# Patient Record
Sex: Male | Born: 1975 | Race: Black or African American | Hispanic: No | Marital: Married | Smoking: Current every day smoker
Health system: Southern US, Community
[De-identification: ages and names within clinical notes are randomized; demographics above are authoritative.]

## PROBLEM LIST (undated history)

## (undated) DIAGNOSIS — J45909 Unspecified asthma, uncomplicated: Secondary | ICD-10-CM

## (undated) DIAGNOSIS — J40 Bronchitis, not specified as acute or chronic: Secondary | ICD-10-CM

## (undated) DIAGNOSIS — I10 Essential (primary) hypertension: Secondary | ICD-10-CM

## (undated) DIAGNOSIS — K611 Rectal abscess: Secondary | ICD-10-CM

---

## 1999-11-12 ENCOUNTER — Emergency Department (HOSPITAL_COMMUNITY): Admission: EM | Admit: 1999-11-12 | Discharge: 1999-11-12 | Payer: Self-pay | Admitting: Emergency Medicine

## 1999-11-12 ENCOUNTER — Encounter: Payer: Self-pay | Admitting: Emergency Medicine

## 2000-01-21 ENCOUNTER — Encounter: Payer: Self-pay | Admitting: Emergency Medicine

## 2000-01-21 ENCOUNTER — Emergency Department (HOSPITAL_COMMUNITY): Admission: EM | Admit: 2000-01-21 | Discharge: 2000-01-21 | Payer: Self-pay | Admitting: Emergency Medicine

## 2000-04-13 ENCOUNTER — Emergency Department (HOSPITAL_COMMUNITY): Admission: EM | Admit: 2000-04-13 | Discharge: 2000-04-13 | Payer: Self-pay | Admitting: Emergency Medicine

## 2000-07-12 ENCOUNTER — Emergency Department (HOSPITAL_COMMUNITY): Admission: EM | Admit: 2000-07-12 | Discharge: 2000-07-12 | Payer: Self-pay | Admitting: Emergency Medicine

## 2000-09-26 ENCOUNTER — Emergency Department (HOSPITAL_COMMUNITY): Admission: EM | Admit: 2000-09-26 | Discharge: 2000-09-26 | Payer: Self-pay | Admitting: Emergency Medicine

## 2000-10-18 ENCOUNTER — Emergency Department (HOSPITAL_COMMUNITY): Admission: EM | Admit: 2000-10-18 | Discharge: 2000-10-18 | Payer: Self-pay | Admitting: Emergency Medicine

## 2000-10-18 ENCOUNTER — Encounter: Payer: Self-pay | Admitting: Emergency Medicine

## 2000-10-31 ENCOUNTER — Ambulatory Visit (HOSPITAL_COMMUNITY): Admission: RE | Admit: 2000-10-31 | Discharge: 2000-10-31 | Payer: Self-pay | Admitting: Family Medicine

## 2000-10-31 ENCOUNTER — Encounter: Payer: Self-pay | Admitting: Family Medicine

## 2001-02-27 ENCOUNTER — Emergency Department (HOSPITAL_COMMUNITY): Admission: EM | Admit: 2001-02-27 | Discharge: 2001-02-27 | Payer: Self-pay | Admitting: Emergency Medicine

## 2003-08-05 ENCOUNTER — Emergency Department (HOSPITAL_COMMUNITY): Admission: EM | Admit: 2003-08-05 | Discharge: 2003-08-05 | Payer: Self-pay | Admitting: Emergency Medicine

## 2005-02-24 ENCOUNTER — Emergency Department (HOSPITAL_COMMUNITY): Admission: EM | Admit: 2005-02-24 | Discharge: 2005-02-24 | Payer: Self-pay | Admitting: Emergency Medicine

## 2005-10-19 ENCOUNTER — Emergency Department (HOSPITAL_COMMUNITY): Admission: EM | Admit: 2005-10-19 | Discharge: 2005-10-19 | Payer: Self-pay | Admitting: Emergency Medicine

## 2006-03-02 ENCOUNTER — Emergency Department (HOSPITAL_COMMUNITY): Admission: EM | Admit: 2006-03-02 | Discharge: 2006-03-02 | Payer: Self-pay | Admitting: Emergency Medicine

## 2007-05-01 ENCOUNTER — Emergency Department (HOSPITAL_COMMUNITY): Admission: EM | Admit: 2007-05-01 | Discharge: 2007-05-01 | Payer: Self-pay | Admitting: Emergency Medicine

## 2007-12-09 ENCOUNTER — Emergency Department (HOSPITAL_COMMUNITY): Admission: EM | Admit: 2007-12-09 | Discharge: 2007-12-09 | Payer: Self-pay | Admitting: Emergency Medicine

## 2008-02-23 ENCOUNTER — Emergency Department (HOSPITAL_COMMUNITY): Admission: EM | Admit: 2008-02-23 | Discharge: 2008-02-23 | Payer: Self-pay | Admitting: Emergency Medicine

## 2008-02-24 ENCOUNTER — Emergency Department (HOSPITAL_COMMUNITY): Admission: EM | Admit: 2008-02-24 | Discharge: 2008-02-24 | Payer: Self-pay | Admitting: Emergency Medicine

## 2008-05-27 ENCOUNTER — Emergency Department (HOSPITAL_COMMUNITY): Admission: EM | Admit: 2008-05-27 | Discharge: 2008-05-27 | Payer: Self-pay | Admitting: Emergency Medicine

## 2010-02-16 ENCOUNTER — Emergency Department (HOSPITAL_COMMUNITY): Admission: EM | Admit: 2010-02-16 | Discharge: 2010-02-16 | Payer: Self-pay | Admitting: Emergency Medicine

## 2010-03-13 ENCOUNTER — Emergency Department (HOSPITAL_COMMUNITY): Admission: EM | Admit: 2010-03-13 | Discharge: 2010-03-14 | Payer: Self-pay | Admitting: Emergency Medicine

## 2010-11-29 LAB — URINALYSIS, ROUTINE W REFLEX MICROSCOPIC
Bilirubin Urine: NEGATIVE
Glucose, UA: NEGATIVE mg/dL
Leukocytes, UA: NEGATIVE
Nitrite: NEGATIVE
Protein, ur: NEGATIVE mg/dL
Specific Gravity, Urine: 1.023 (ref 1.005–1.030)
Urobilinogen, UA: 1 mg/dL (ref 0.0–1.0)
pH: 5.5 (ref 5.0–8.0)

## 2010-11-29 LAB — URINE MICROSCOPIC-ADD ON

## 2010-11-30 LAB — GC/CHLAMYDIA PROBE AMP, URINE
Chlamydia, Swab/Urine, PCR: NEGATIVE
GC Probe Amp, Urine: NEGATIVE

## 2011-06-07 LAB — GC/CHLAMYDIA PROBE AMP, GENITAL
Chlamydia, DNA Probe: NEGATIVE
GC Probe Amp, Genital: POSITIVE — AB

## 2011-06-25 LAB — GC/CHLAMYDIA PROBE AMP, GENITAL
Chlamydia, DNA Probe: NEGATIVE
GC Probe Amp, Genital: NEGATIVE

## 2011-06-25 LAB — RPR: RPR Ser Ql: NONREACTIVE

## 2014-04-28 ENCOUNTER — Emergency Department (INDEPENDENT_AMBULATORY_CARE_PROVIDER_SITE_OTHER): Payer: BC Managed Care – PPO

## 2014-04-28 ENCOUNTER — Emergency Department (HOSPITAL_COMMUNITY)
Admission: EM | Admit: 2014-04-28 | Discharge: 2014-04-28 | Disposition: A | Discharge status: Home / Self Care | Payer: BC Managed Care – PPO | Source: Home / Self Care | Attending: Emergency Medicine | Admitting: Emergency Medicine

## 2014-04-28 ENCOUNTER — Encounter (HOSPITAL_COMMUNITY): Payer: Self-pay | Admitting: Emergency Medicine

## 2014-04-28 DIAGNOSIS — J069 Acute upper respiratory infection, unspecified: Secondary | ICD-10-CM

## 2014-04-28 DIAGNOSIS — J45901 Unspecified asthma with (acute) exacerbation: Secondary | ICD-10-CM

## 2014-04-28 MED ORDER — ALBUTEROL SULFATE HFA 108 (90 BASE) MCG/ACT IN AERS
INHALATION_SPRAY | RESPIRATORY_TRACT | Status: AC
  Filled 2014-04-28: qty 6.7

## 2014-04-28 MED ORDER — PREDNISONE 50 MG PO TABS: 50.0000 mg | ORAL_TABLET | Freq: Every day | ORAL | Status: DC

## 2014-04-28 MED ORDER — ALBUTEROL SULFATE HFA 108 (90 BASE) MCG/ACT IN AERS: 2.0000 | INHALATION_SPRAY | RESPIRATORY_TRACT | Status: DC | PRN

## 2014-04-28 MED ORDER — ALBUTEROL SULFATE HFA 108 (90 BASE) MCG/ACT IN AERS
2.0000 | INHALATION_SPRAY | RESPIRATORY_TRACT | Status: DC | PRN
  Administered 2014-04-28: 2 via RESPIRATORY_TRACT

## 2014-04-28 MED ORDER — DEXAMETHASONE SODIUM PHOSPHATE 10 MG/ML IJ SOLN
10.0000 mg | Freq: Once | INTRAMUSCULAR | Status: AC
  Administered 2014-04-28: 10 mg via INTRAMUSCULAR

## 2014-04-28 MED ORDER — IPRATROPIUM BROMIDE 0.02 % IN SOLN
RESPIRATORY_TRACT | Status: AC
  Filled 2014-04-28: qty 2.5

## 2014-04-28 MED ORDER — IPRATROPIUM BROMIDE 0.02 % IN SOLN
0.5000 mg | Freq: Once | RESPIRATORY_TRACT | Status: AC
  Administered 2014-04-28: 0.5 mg via RESPIRATORY_TRACT

## 2014-04-28 MED ORDER — ALBUTEROL SULFATE (2.5 MG/3ML) 0.083% IN NEBU
5.0000 mg | INHALATION_SOLUTION | Freq: Once | RESPIRATORY_TRACT | Status: AC
  Administered 2014-04-28: 5 mg via RESPIRATORY_TRACT

## 2014-04-28 MED ORDER — ALBUTEROL SULFATE (2.5 MG/3ML) 0.083% IN NEBU: 2.5000 mg | INHALATION_SOLUTION | RESPIRATORY_TRACT | Status: DC | PRN

## 2014-04-28 MED ORDER — AEROCHAMBER PLUS FLO-VU LARGE MISC
1.0000 | Freq: Once | Status: AC
  Administered 2014-04-28: 1

## 2014-04-28 MED ORDER — ALBUTEROL SULFATE (2.5 MG/3ML) 0.083% IN NEBU
INHALATION_SOLUTION | RESPIRATORY_TRACT | Status: AC
  Filled 2014-04-28: qty 6

## 2014-04-28 MED ORDER — CHLORPHENIRAMINE-PSE-IBUPROFEN 2-30-200 MG PO TABS: ORAL_TABLET | ORAL | Status: DC

## 2014-04-28 MED ORDER — DEXAMETHASONE SODIUM PHOSPHATE 10 MG/ML IJ SOLN
INTRAMUSCULAR | Status: AC
  Filled 2014-04-28: qty 1

## 2014-04-28 NOTE — ED Notes (Signed)
Pt c/o cold sx onset yest;  Sx include: SOB, wheezing, productive cough, chest d/c w/deep breaths, weakness Has been using son's nebulizer tx and pro air; needing refills Denies f/v/n/d Alert and talking in complete sentences No signs of acute distress.

## 2014-04-28 NOTE — ED Provider Notes (Signed)
CSN: [id number]     Arrival date & time 04/28/14  1112 History   First MD Initiated Contact with Patient 04/28/14 1227     Chief Complaint  Patient presents with  . URI   (Consider location/radiation/quality/duration/timing/severity/associated sxs/prior Treatment) HPI Comments: 38 year old male presents for evaluation of congestion, cough, wheezing, chest tightness. This started 5 days ago with what he describes as feeling like he is about to get a cold. He had nasal congestion and a tickle in his throat. 2 days ago he started to develop some mild wheezing, and this has gotten a lot worse today. He felt very short of breath upon arrival, but was immediately started on a nebulizer treatment which helped significantly. He denies any shortness of breath at this time. He has never been diagnosed with asthma but he reports a history of frequent wheezing when exposed to dust, or when he gets any sort of respiratory infection. He denies any leg swelling, pleuritic chest pain, fever, chills, NVD. No recent travel or sick contacts.   History reviewed. No pertinent past medical history. History reviewed. No pertinent past surgical history. No family history on file. History  Substance Use Topics  . Smoking status: Current Every Day Smoker -- 0.50 packs/day    Types: Cigarettes  . Smokeless tobacco: Not on file  . Alcohol Use: Yes    Review of Systems  Constitutional: Positive for fatigue. Negative for fever and chills.  HENT: Positive for congestion. Negative for ear pain, postnasal drip, rhinorrhea, sinus pressure, sneezing and sore throat.   Respiratory: Positive for cough, chest tightness, shortness of breath and wheezing.   Cardiovascular: Negative for chest pain, palpitations and leg swelling.  All other systems reviewed and are negative.   Allergies  Review of patient's allergies indicates no known allergies.  Home Medications   Prior to Admission medications   Medication Sig Start  Date End Date Taking? Authorizing Provider  albuterol (PROVENTIL HFA;VENTOLIN HFA) 108 (90 BASE) MCG/ACT inhaler Inhale 2 puffs into the lungs every 4 (four) hours as needed for wheezing. 04/28/14   Graylon Good, PA-C  albuterol (PROVENTIL) (2.5 MG/3ML) 0.083% nebulizer solution Take 3 mLs (2.5 mg total) by nebulization every 4 (four) hours as needed for wheezing or shortness of breath. 04/28/14   Graylon Good, PA-C  Chlorpheniramine-PSE-Ibuprofen (ADVIL ALLERGY SINUS) 2-30-200 MG TABS 1-2 tabs PO Q4-6 hrs PRN 04/28/14   Graylon Good, PA-C  predniSONE (DELTASONE) 50 MG tablet Take 1 tablet (50 mg total) by mouth daily with breakfast. 04/28/14   Graylon Good, PA-C   BP 127/68  Pulse 101  Temp(Src) 98.3 F (36.8 C) (Oral)  Resp 20  SpO2 97% Physical Exam  Nursing note and vitals reviewed. Constitutional: He is oriented to person, place, and time. He appears well-developed and well-nourished. No distress.  HENT:  Head: Normocephalic and atraumatic.  Right Ear: External ear normal.  Left Ear: External ear normal.  Nose: Nose normal.  Mouth/Throat: Oropharynx is clear and moist. No oropharyngeal exudate.  Neck: Normal range of motion. Neck supple.  Cardiovascular: Normal rate, regular rhythm and normal heart sounds.  Exam reveals no gallop and no friction rub.   No murmur heard. Pulmonary/Chest: Effort normal. No respiratory distress. He has wheezes (scattered, expiratory). He has no rales. He exhibits no tenderness.  Prolonged expiratory phase  Lymphadenopathy:    He has no cervical adenopathy.  Neurological: He is alert and oriented to person, place, and time. Coordination normal.  Skin: Skin is  warm and dry. No rash noted. He is not diaphoretic.  Psychiatric: He has a normal mood and affect. Judgment normal.    ED Course  Procedures (including critical care time) Labs Review Labs Reviewed - No data to display  Imaging Review Dg Chest 2 View  04/28/2014   CLINICAL DATA:   Chest pain, cough and congestion for 24 hr, smoker  EXAM: CHEST  2 VIEW  COMPARISON:  05/27/2008  FINDINGS: Normal heart size, mediastinal contours, and pulmonary vascularity.  Lungs hyperaerated but clear.  No infiltrate, pleural effusion, or pneumothorax.  Bones unremarkable.  IMPRESSION: Hyperinflated lungs without acute infiltrate.   Electronically Signed   By: Ulyses Southward M.D.   On: 04/28/2014 12:47   After 1 nebulizer treatment with 5 mg of albuterol and 0.5 mg of ipratropium, he still has wheezing. Will repeat nebulizer, and 10 mg of IM Decadron, chest x-ray  MDM   1. RAD (reactive airway disease), unspecified asthma severity, with acute exacerbation   2. URI (upper respiratory infection)    After second nebulizer treatment, wheezing has completely resolved. Chest x-ray supports reactive airway disease. Given this patient's reported history, he probably has asthma at baseline. Encouraged to stop smoking. Followup with primary care ASAP, I have given him some resources for finding a primary care provider. At this time, he will be discharged with albuterol inhaler, as well as neb solution and a prescription for a nebulizer at the patient's request, 50 mg of oral prednisone daily for the next 3 days, and recommended Advil allergy sinus for cold symptoms.  Also Rx neb machine at pt request, advised take to medical supply store   Meds ordered this encounter  Medications  . albuterol (PROVENTIL) (2.5 MG/3ML) 0.083% nebulizer solution 5 mg    Sig:   . ipratropium (ATROVENT) nebulizer solution 0.5 mg    Sig:   . albuterol (PROVENTIL) (2.5 MG/3ML) 0.083% nebulizer solution 5 mg    Sig:   . ipratropium (ATROVENT) nebulizer solution 0.5 mg    Sig:   . dexamethasone (DECADRON) injection 10 mg    Sig:   . albuterol (PROVENTIL HFA;VENTOLIN HFA) 108 (90 BASE) MCG/ACT inhaler 2 puff    Sig:   . AEROCHAMBER PLUS FLO-VU LARGE MISC 1 each    Sig:   . predniSONE (DELTASONE) 50 MG tablet    Sig: Take  1 tablet (50 mg total) by mouth daily with breakfast.    Dispense:  3 tablet    Refill:  0    Order Specific Question:  Supervising Provider    Answer:  Lorenz Coaster, DAVID C [id number]  . Chlorpheniramine-PSE-Ibuprofen (ADVIL ALLERGY SINUS) 2-30-200 MG TABS    Sig: 1-2 tabs PO Q4-6 hrs PRN    Dispense:  30 each    Refill:  1    Order Specific Question:  Supervising Provider    Answer:  Lorenz Coaster, DAVID C [id number]  . albuterol (PROVENTIL HFA;VENTOLIN HFA) 108 (90 BASE) MCG/ACT inhaler    Sig: Inhale 2 puffs into the lungs every 4 (four) hours as needed for wheezing.    Dispense:  1 Inhaler    Refill:  0    Order Specific Question:  Supervising Provider    Answer:  Lorenz Coaster, DAVID C [id number]  . albuterol (PROVENTIL) (2.5 MG/3ML) 0.083% nebulizer solution    Sig: Take 3 mLs (2.5 mg total) by nebulization every 4 (four) hours as needed for wheezing or shortness of breath.    Dispense:  75 mL  Refill:  1    Order Specific Question:  Supervising Provider    Answer:  Lorenz CoasterKELLER, DAVID C [[id number]]     Graylon GoodZachary H Walid Haig, PA-C 04/28/14 1539

## 2014-04-28 NOTE — ED Notes (Signed)
Per Dr. Lorenz CoasterKeller, EGK = normal Ok to give breathing tx

## 2014-04-28 NOTE — Discharge Instructions (Signed)
Asthma, Acute Bronchospasm °Acute bronchospasm caused by asthma is also referred to as an asthma attack. Bronchospasm means your air passages become narrowed. The narrowing is caused by inflammation and tightening of the muscles in the air tubes (bronchi) in your lungs. This can make it hard to breathe or cause you to wheeze and cough. °CAUSES °Possible triggers are: °· Animal dander from the skin, hair, or feathers of animals. °· Dust mites contained in house dust. °· Cockroaches. °· Pollen from trees or grass. °· Mold. °· Cigarette or tobacco smoke. °· Air pollutants such as dust, household cleaners, hair sprays, aerosol sprays, paint fumes, strong chemicals, or strong odors. °· Cold air or weather changes. Cold air may trigger inflammation. Winds increase molds and pollens in the air. °· Strong emotions such as crying or laughing hard. °· Stress. °· Certain medicines such as aspirin or beta-blockers. °· Sulfites in foods and drinks, such as dried fruits and wine. °· Infections or inflammatory conditions, such as a flu, cold, or inflammation of the nasal membranes (rhinitis). °· Gastroesophageal reflux disease (GERD). GERD is a condition where stomach acid backs up into your esophagus. °· Exercise or strenuous activity. °SIGNS AND SYMPTOMS  °· Wheezing. °· Excessive coughing, particularly at night. °· Chest tightness. °· Shortness of breath. °DIAGNOSIS  °Your health care provider will ask you about your medical history and perform a physical exam. A chest X-ray or blood testing may be performed to look for other causes of your symptoms or other conditions that may have triggered your asthma attack.  °TREATMENT  °Treatment is aimed at reducing inflammation and opening up the airways in your lungs.  Most asthma attacks are treated with inhaled medicines. These include quick relief or rescue medicines (such as bronchodilators) and controller medicines (such as inhaled corticosteroids). These medicines are sometimes  given through an inhaler or a nebulizer. Systemic steroid medicine taken by mouth or given through an IV tube also can be used to reduce the inflammation when an attack is moderate or severe. Antibiotic medicines are only used if a bacterial infection is present.  °HOME CARE INSTRUCTIONS  °· Rest. °· Drink plenty of liquids. This helps the mucus to remain thin and be easily coughed up. Only use caffeine in moderation and do not use alcohol until you have recovered from your illness. °· Do not smoke. Avoid being exposed to secondhand smoke. °· You play a critical role in keeping yourself in good health. Avoid exposure to things that cause you to wheeze or to have breathing problems. °· Keep your medicines up-to-date and available. Carefully follow your health care provider's treatment plan. °· Take your medicine exactly as prescribed. °· When pollen or pollution is bad, keep windows closed and use an air conditioner or go to places with air conditioning. °· Asthma requires careful medical care. See your health care provider for a follow-up as advised. If you are more than [redacted] weeks pregnant and you were prescribed any new medicines, let your obstetrician know about the visit and how you are doing. Follow up with your health care provider as directed. °· After you have recovered from your asthma attack, make an appointment with your outpatient doctor to talk about ways to reduce the likelihood of future attacks. If you do not have a doctor who manages your asthma, make an appointment with a primary care doctor to discuss your asthma. °SEEK IMMEDIATE MEDICAL CARE IF:  °· You are getting worse. °· You have trouble breathing. If severe, call your local   emergency services (911 in the [place]  You develop chest pain or discomfort.  You are vomiting.  You are not able to keep fluids down.  You are coughing up yellow, green, brown, or bloody sputum.  You have a fever and your symptoms suddenly get worse.  You have  trouble swallowing. MAKE SURE YOU:   Understand these instructions.  Will watch your condition.  Will get help right away if you are not doing well or get worse. Document Released: 12/15/2006 Document Revised: 09/04/2013 Document Reviewed: 03/07/2013 [facility] Patient Information 2015 [place], [place]. This information is not intended to replace advice given to you by your health care provider. Make sure you discuss any questions you have with your health care provider.  How to Use a Nebulizer If you have asthma or other breathing problems, you might need to breathe in (inhale) medicine. This can be done with a nebulizer. A nebulizer is a device that turns liquid medicine into a mist that you can inhale.  There are different kinds of nebulizers. Most are small. With some, you breathe in through a mouthpiece. With others, a mask fits over your nose and mouth. Most nebulizers must be connected to a small air compressor. Air is forced through tubing from the compressor to the nebulizer. The forced air changes the liquid into a fine spray. RISKS AND COMPLICATIONS The nebulizer must work properly for it to help your breathing. If the nebulizer does not produce mist, or if foam comes out, this indicates that the nebulizer is not working properly. Sometimes a filter can get clogged, or there might be a problem with the air compressor. Check the instruction booklet that came with your nebulizer. It should tell you how to fix problems or where to call for help. You should have at least one extra nebulizer at home. That way, you will always have one when you need it.  HOW TO PREPARE BEFORE USING THE NEBULIZER Take these steps before using the nebulizer: 1. Check your medicine. Make sure it has not expired and is not damaged in any way.  2. Wash your hands with soap and water.  3. Put all the parts of your nebulizer on a sturdy, flat surface. Make sure the tubing connects the compressor and the  nebulizer. 4. Measure the liquid medicine according to your health care provider's instructions. Pour it into the nebulizer. 5. Attach the mouthpiece or mask.  6. Test the nebulizer by turning it on to make sure a spray is coming out. Then, turn it off.  HOW TO USE THE NEBULIZER 1. Sit down and focus on staying relaxed.  2. If your nebulizer has a mask, put it over your nose and mouth. If you use a mouthpiece, put it in your mouth. Press your lips firmly around the mouthpiece. 3. Turn on the nebulizer.  4. Breathe out.  5. Some nebulizers have a finger valve. If yours does, cover up the air hole so the air gets to the nebulizer. 6. Once the medicine begins to mist out, take slow, deep breaths. If there is a finger valve, release it at the end of your breath. 7. Continue taking slow, deep breaths until the nebulizer is empty.  Be sure to stop the machine at any point if you start coughing or if the medicine foams or bubbles. HOW TO CLEAN THE NEBULIZER  The nebulizer and all its parts must be kept very clean. Follow the manufacturer's instructions for cleaning. For most nebulizers, you should follow  these guidelines:  Wash the nebulizer after each use. Use warm water and soap. Rinse it well. Shake the nebulizer to remove extra water. Put it on a clean towel until it is completely dry. To make sure it is dry, put the nebulizer back together. Turn on the compressor for a few minutes. This will blow air through the nebulizer.   Do not wash the tubing or the finger valve.   Store the nebulizer in a dust-free place.   Inspect the filter every week. Replace it any time it looks dirty.   Sometimes the nebulizer will need a more complete cleaning. The instruction booklet should say how often you need to do this. SEEK MEDICAL CARE IF:   You continue to have difficulty breathing.   You have trouble using the nebulizer.  Document Released: 08/18/2009 Document Revised: 01/14/2014 Document  Reviewed: 02/19/2013 [facility] Patient Information 2015 [place], [place]. This information is not intended to replace advice given to you by your health care provider. Make sure you discuss any questions you have with your health care provider.  Metered Dose Inhaler with Spacer Inhaled medicines are the basis of treatment of asthma and other breathing problems. Inhaled medicine can only be effective if used properly. Good technique assures that the medicine reaches the lungs. Your health care provider has asked you to use a spacer with your inhaler to help you take the medicine more effectively. A spacer is a plastic tube with a mouthpiece on one end and an opening that connects to the inhaler on the other end. Metered dose inhalers (MDIs) are used to deliver a variety of inhaled medicines. These include quick relief or rescue medicines (such as bronchodilators) and controller medicines (such as corticosteroids). The medicine is delivered by pushing down on a metal canister to release a set amount of spray. If you are using different kinds of inhalers, use your quick relief medicine to open the airways 10-15 minutes before using a steroid if instructed to do so by your health care provider. If you are unsure which inhalers to use and the order of using them, ask your health care provider, nurse, or respiratory therapist. HOW TO USE THE INHALER WITH A SPACER 1. Remove cap from inhaler. 2. If you are using the inhaler for the first time, you will need to prime it. Shake the inhaler for 5 seconds and release four puffs into the air, away from your face. Ask your health care provider or pharmacist if you have questions about priming your inhaler. 3. Shake inhaler for 5 seconds before each breath in (inhalation). 4. Place the open end of the spacer onto the mouthpiece of the inhaler. 5. Position the inhaler so that the top of the canister faces up and the spacer mouthpiece faces you. 6. Put your index finger  on the top of the medicine canister. Your thumb supports the bottom of the inhaler and the spacer. 7. Breathe out (exhale) normally and as completely as possible. 8. Immediately after exhaling, place the spacer between your teeth and into your mouth. Close your mouth tightly around the spacer. 9. Press the canister down with the index finger to release the medicine. 10. At the same time as the canister is pressed, inhale deeply and slowly until the lungs are completely filled. This should take 4-6 seconds. Keep your tongue down and out of the way. 11. Hold the medicine in your lungs for 5-10 seconds (10 seconds is best). This helps the medicine get into the small  airways of your lungs. Exhale. 12. Repeat inhaling deeply through the spacer mouthpiece. Again hold that breath for up to 10 seconds (10 seconds is best). Exhale slowly. If it is difficult to take this second deep breath through the spacer, breathe normally several times through the spacer. Remove the spacer from your mouth. 13. Wait at least 15-30 seconds between puffs. Continue with the above steps until you have taken the number of puffs your health care provider has ordered. Do not use the inhaler more than your health care provider directs you to. 14. Remove spacer from the inhaler and place cap on inhaler. 15. Follow the directions from your health care provider or the inhaler insert for cleaning the inhaler and spacer. If you are using a steroid inhaler, rinse your mouth with water after your last puff, gargle, and spit out the water. Do not swallow the water. AVOID:  Inhaling before or after starting the spray of medicine. It takes practice to coordinate your breathing with triggering the spray.  Inhaling through the nose (rather than the mouth) when triggering the spray. HOW TO DETERMINE IF YOUR INHALER IS FULL OR NEARLY EMPTY You cannot know when an inhaler is empty by shaking it. A few inhalers are now being made with dose  counters. Ask your health care provider for a prescription that has a dose counter if you feel you need that extra help. If your inhaler does not have a counter, ask your health care provider to help you determine the date you need to refill your inhaler. Write the refill date on a calendar or your inhaler canister. Refill your inhaler 7-10 days before it runs out. Be sure to keep an adequate supply of medicine. This includes making sure it is not expired, and you have a spare inhaler.  SEEK MEDICAL CARE IF:   Symptoms are only partially relieved with your inhaler.  You are having trouble using your inhaler.  You experience some increase in phlegm. SEEK IMMEDIATE MEDICAL CARE IF:   You feel little or no relief with your inhalers. You are still wheezing and are feeling shortness of breath or tightness in your chest or both.  You have dizziness, headaches, or fast heart rate.  You have chills, fever, or night sweats.  There is a noticeable increase in phlegm production, or there is blood in the phlegm. Document Released: 08/30/2005 Document Revised: 01/14/2014 Document Reviewed: 02/15/2013 [facility] Patient Information 2015 [place], [place]. This information is not intended to replace advice given to you by your health care provider. Make sure you discuss any questions you have with your health care provider.  Upper Respiratory Infection, Adult An upper respiratory infection (URI) is also sometimes known as the common cold. The upper respiratory tract includes the nose, sinuses, throat, trachea, and bronchi. Bronchi are the airways leading to the lungs. Most people improve within 1 week, but symptoms can last up to 2 weeks. A residual cough may last even longer.  CAUSES Many different viruses can infect the tissues lining the upper respiratory tract. The tissues become irritated and inflamed and often become very moist. Mucus production is also common. A cold is contagious. You can easily spread  the virus to others by oral contact. This includes kissing, sharing a glass, coughing, or sneezing. Touching your mouth or nose and then touching a surface, which is then touched by another person, can also spread the virus. SYMPTOMS  Symptoms typically develop 1 to 3 days after you come in contact with  a cold virus. Symptoms vary from person to person. They may include:  Runny nose.  Sneezing.  Nasal congestion.  Sinus irritation.  Sore throat.  Loss of voice (laryngitis).  Cough.  Fatigue.  Muscle aches.  Loss of appetite.  Headache.  Low-grade fever. DIAGNOSIS  You might diagnose your own cold based on familiar symptoms, since most people get a cold 2 to 3 times a year. Your caregiver can confirm this based on your exam. Most importantly, your caregiver can check that your symptoms are not due to another disease such as strep throat, sinusitis, pneumonia, asthma, or epiglottitis. Blood tests, throat tests, and X-rays are not necessary to diagnose a common cold, but they may sometimes be helpful in excluding other more serious diseases. Your caregiver will decide if any further tests are required. RISKS AND COMPLICATIONS  You may be at risk for a more severe case of the common cold if you smoke cigarettes, have chronic heart disease (such as heart failure) or lung disease (such as asthma), or if you have a weakened immune system. The very young and very old are also at risk for more serious infections. Bacterial sinusitis, middle ear infections, and bacterial pneumonia can complicate the common cold. The common cold can worsen asthma and chronic obstructive pulmonary disease (COPD). Sometimes, these complications can require emergency medical care and may be life-threatening. PREVENTION  The best way to protect against getting a cold is to practice good hygiene. Avoid oral or hand contact with people with cold symptoms. Wash your hands often if contact occurs. There is no clear  evidence that vitamin C, vitamin E, echinacea, or exercise reduces the chance of developing a cold. However, it is always recommended to get plenty of rest and practice good nutrition. TREATMENT  Treatment is directed at relieving symptoms. There is no cure. Antibiotics are not effective, because the infection is caused by a virus, not by bacteria. Treatment may include:  Increased fluid intake. Sports drinks offer valuable electrolytes, sugars, and fluids.  Breathing heated mist or steam (vaporizer or shower).  Eating chicken soup or other clear broths, and maintaining good nutrition.  Getting plenty of rest.  Using gargles or lozenges for comfort.  Controlling fevers with ibuprofen or acetaminophen as directed by your caregiver.  Increasing usage of your inhaler if you have asthma. Zinc gel and zinc lozenges, taken in the first 24 hours of the common cold, can shorten the duration and lessen the severity of symptoms. Pain medicines may help with fever, muscle aches, and throat pain. A variety of non-prescription medicines are available to treat congestion and runny nose. Your caregiver can make recommendations and may suggest nasal or lung inhalers for other symptoms.  HOME CARE INSTRUCTIONS   Only take over-the-counter or prescription medicines for pain, discomfort, or fever as directed by your caregiver.  Use a warm mist humidifier or inhale steam from a shower to increase air moisture. This may keep secretions moist and make it easier to breathe.  Drink enough water and fluids to keep your urine clear or pale yellow.  Rest as needed.  Return to work when your temperature has returned to normal or as your caregiver advises. You may need to stay home longer to avoid infecting others. You can also use a face mask and careful hand washing to prevent spread of the virus. SEEK MEDICAL CARE IF:   After the first few days, you feel you are getting worse rather than better.  You need  your  caregiver's advice about medicines to control symptoms.  You develop chills, worsening shortness of breath, or brown or red sputum. These may be signs of pneumonia.  You develop yellow or brown nasal discharge or pain in the face, especially when you bend forward. These may be signs of sinusitis.  You develop a fever, swollen neck glands, pain with swallowing, or white areas in the back of your throat. These may be signs of strep throat. SEEK IMMEDIATE MEDICAL CARE IF:   You have a fever.  You develop severe or persistent headache, ear pain, sinus pain, or chest pain.  You develop wheezing, a prolonged cough, cough up blood, or have a change in your usual mucus (if you have chronic lung disease).  You develop sore muscles or a stiff neck. Document Released: 02/23/2001 Document Revised: 11/22/2011 Document Reviewed: 12/05/2013 [facility] Patient Information 2015 [place], [place]. This information is not intended to replace advice given to you by your health care provider. Make sure you discuss any questions you have with your health care provider.

## 2014-04-29 NOTE — ED Provider Notes (Signed)
Medical screening examination/treatment/procedure(s) were performed by non-physician practitioner and as supervising physician I was immediately available for consultation/collaboration.  Allante Beane, M.D.  Alexzandrea Normington C Ova Gillentine, MD 04/29/14 [id number] 

## 2015-01-06 ENCOUNTER — Emergency Department (HOSPITAL_COMMUNITY): Payer: BLUE CROSS/BLUE SHIELD

## 2015-01-06 ENCOUNTER — Encounter (HOSPITAL_COMMUNITY): Payer: Self-pay | Admitting: Emergency Medicine

## 2015-01-06 ENCOUNTER — Emergency Department (HOSPITAL_COMMUNITY)
Admission: EM | Admit: 2015-01-06 | Discharge: 2015-01-06 | Disposition: A | Discharge status: Home / Self Care | Payer: BLUE CROSS/BLUE SHIELD | Attending: Emergency Medicine | Admitting: Emergency Medicine

## 2015-01-06 DIAGNOSIS — Z720 Tobacco use: Secondary | ICD-10-CM | POA: Insufficient documentation

## 2015-01-06 DIAGNOSIS — Z7951 Long term (current) use of inhaled steroids: Secondary | ICD-10-CM | POA: Diagnosis not present

## 2015-01-06 DIAGNOSIS — R69 Illness, unspecified: Secondary | ICD-10-CM

## 2015-01-06 DIAGNOSIS — J111 Influenza due to unidentified influenza virus with other respiratory manifestations: Secondary | ICD-10-CM | POA: Insufficient documentation

## 2015-01-06 DIAGNOSIS — R000 Tachycardia, unspecified: Secondary | ICD-10-CM | POA: Insufficient documentation

## 2015-01-06 DIAGNOSIS — Z79899 Other long term (current) drug therapy: Secondary | ICD-10-CM | POA: Diagnosis not present

## 2015-01-06 DIAGNOSIS — R079 Chest pain, unspecified: Secondary | ICD-10-CM | POA: Diagnosis present

## 2015-01-06 HISTORY — DX: Bronchitis, not specified as acute or chronic: J40

## 2015-01-06 LAB — CBC WITH DIFFERENTIAL/PLATELET
Basophils Absolute: 0 10*3/uL (ref 0.0–0.1)
Basophils Relative: 0 % (ref 0–1)
Eosinophils Absolute: 0 10*3/uL (ref 0.0–0.7)
Eosinophils Relative: 0 % (ref 0–5)
HCT: 46.6 % (ref 39.0–52.0)
Hemoglobin: 16.1 g/dL (ref 13.0–17.0)
Lymphocytes Relative: 4 % — ABNORMAL LOW (ref 12–46)
Lymphs Abs: 0.4 10*3/uL — ABNORMAL LOW (ref 0.7–4.0)
MCH: 29 pg (ref 26.0–34.0)
MCHC: 34.5 g/dL (ref 30.0–36.0)
MCV: 83.8 fL (ref 78.0–100.0)
Monocytes Absolute: 0.8 10*3/uL (ref 0.1–1.0)
Monocytes Relative: 9 % (ref 3–12)
Neutro Abs: 8.4 10*3/uL — ABNORMAL HIGH (ref 1.7–7.7)
Neutrophils Relative %: 87 % — ABNORMAL HIGH (ref 43–77)
Platelets: 185 10*3/uL (ref 150–400)
RBC: 5.56 MIL/uL (ref 4.22–5.81)
RDW: 14 % (ref 11.5–15.5)
WBC: 9.6 10*3/uL (ref 4.0–10.5)

## 2015-01-06 LAB — I-STAT CHEM 8, ED
BUN: 14 mg/dL (ref 6–23)
Calcium, Ion: 1.13 mmol/L (ref 1.12–1.23)
Chloride: 102 mmol/L (ref 96–112)
Creatinine, Ser: 1.2 mg/dL (ref 0.50–1.35)
Glucose, Bld: 109 mg/dL — ABNORMAL HIGH (ref 70–99)
HCT: 53 % — ABNORMAL HIGH (ref 39.0–52.0)
Hemoglobin: 18 g/dL — ABNORMAL HIGH (ref 13.0–17.0)
Potassium: 3.8 mmol/L (ref 3.5–5.1)
Sodium: 139 mmol/L (ref 135–145)
TCO2: 19 mmol/L (ref 0–100)

## 2015-01-06 LAB — I-STAT TROPONIN, ED: Troponin i, poc: 0 ng/mL (ref 0.00–0.08)

## 2015-01-06 MED ORDER — SODIUM CHLORIDE 0.9 % IV BOLUS (SEPSIS)
2000.0000 mL | Freq: Once | INTRAVENOUS | Status: AC
  Administered 2015-01-06: 2000 mL via INTRAVENOUS

## 2015-01-06 MED ORDER — ACETAMINOPHEN 325 MG PO TABS
325.0000 mg | ORAL_TABLET | Freq: Once | ORAL | Status: AC
  Administered 2015-01-06: 325 mg via ORAL
  Filled 2015-01-06: qty 1

## 2015-01-06 MED ORDER — ACETAMINOPHEN 325 MG PO TABS
650.0000 mg | ORAL_TABLET | Freq: Once | ORAL | Status: AC
  Administered 2015-01-06: 650 mg via ORAL

## 2015-01-06 MED ORDER — ALBUTEROL SULFATE (2.5 MG/3ML) 0.083% IN NEBU
5.0000 mg | INHALATION_SOLUTION | Freq: Once | RESPIRATORY_TRACT | Status: AC
  Administered 2015-01-06: 5 mg via RESPIRATORY_TRACT
  Filled 2015-01-06: qty 6

## 2015-01-06 MED ORDER — ACETAMINOPHEN 325 MG PO TABS
ORAL_TABLET | ORAL | Status: DC
Start: 2015-01-06 — End: 2015-01-07
  Filled 2015-01-06: qty 2

## 2015-01-06 NOTE — ED Notes (Signed)
Pt to ED with c/o chest pain and shortness of breath.  Pt was seen at his MD's office last week for bronchitis.

## 2015-01-06 NOTE — ED Provider Notes (Signed)
CSN: [id number]     Arrival date & time 01/06/15  1707 History   First MD Initiated Contact with Patient 01/06/15 1745     Chief Complaint  Patient presents with  . Chest Pain     (Consider location/radiation/quality/duration/timing/severity/associated sxs/prior Treatment) HPI Complains of anterior chest pain with cough productive of clear sputum and shortness of breath onset last week, becoming worse this morning. He was prescribed prednisone which he started taking 4 days ago for "bronchitis" he's also used inhalers at home with partial relief. Associated symptoms include sore throat, headache and diffuse myalgias. No vomiting or nausea. He's had a few episodes of watery stools. No other associated symptoms. Chest pain is worse with coughing myalgias worse with moving or changing positions no other associated symptoms Past Medical History  Diagnosis Date  . Bronchitis    History reviewed. No pertinent past surgical history. No family history on file. History  Substance Use Topics  . Smoking status: Current Every Day Smoker -- 0.50 packs/day    Types: Cigarettes  . Smokeless tobacco: Not on file  . Alcohol Use: Yes    Review of Systems  HENT: Positive for sore throat.   Respiratory: Positive for cough and shortness of breath.   Cardiovascular: Positive for chest pain.  Musculoskeletal: Positive for myalgias.      Allergies  Review of patient's allergies indicates no known allergies.  Home Medications   Prior to Admission medications   Medication Sig Start Date End Date Taking? Authorizing Provider  albuterol (PROVENTIL HFA;VENTOLIN HFA) 108 (90 BASE) MCG/ACT inhaler Inhale 2 puffs into the lungs every 4 (four) hours as needed for wheezing. 04/28/14  Yes Graylon Good, PA-C  albuterol (PROVENTIL) (2.5 MG/3ML) 0.083% nebulizer solution Take 3 mLs (2.5 mg total) by nebulization every 4 (four) hours as needed for wheezing or shortness of breath. 04/28/14  Yes Adrian Blackwater Baker,  PA-C  Fluticasone Furoate-Vilanterol 100-25 MCG/INH AEPB Inhale 2 puffs into the lungs daily.   Yes Historical Provider, MD  Chlorpheniramine-PSE-Ibuprofen (ADVIL ALLERGY SINUS) 2-30-200 MG TABS 1-2 tabs PO Q4-6 hrs PRN Patient not taking: Reported on 01/06/2015 04/28/14   Graylon Good, PA-C  predniSONE (DELTASONE) 50 MG tablet Take 1 tablet (50 mg total) by mouth daily with breakfast. Patient not taking: Reported on 01/06/2015 04/28/14   Graylon Good, PA-C   BP 136/83 mmHg  Pulse 133  Temp(Src) 103 F (39.4 C) (Oral)  Resp 24  Ht  (1.727 m)  Wt 184 lb (83.462 kg)  BMI 27.98 kg/m2  SpO2 95% Physical Exam  Constitutional: He appears well-developed and well-nourished. No distress.  HENT:  Head: Normocephalic and atraumatic.  Eyes: Conjunctivae are normal. Pupils are equal, round, and reactive to light.  Neck: Neck supple. No tracheal deviation present. No thyromegaly present.  Cardiovascular: Regular rhythm.   No murmur heard. Tachycardic  Pulmonary/Chest: Effort normal and breath sounds normal.  Abdominal: Soft. Bowel sounds are normal. He exhibits no distension. There is no tenderness.  Musculoskeletal: Normal range of motion. He exhibits no edema or tenderness.  Neurological: He is alert. Coordination normal.  Skin: Skin is warm and dry. No rash noted.  Psychiatric: He has a normal mood and affect.  Nursing note and vitals reviewed.   ED Course  Procedures (including critical care time) Labs Review Labs Reviewed  CBC WITH DIFFERENTIAL/PLATELET  I-STAT CHEM 8, ED  I-STAT TROPOININ, ED    Imaging Review No results found.   EKG Interpretation   Date/Time:  Monday January 06 2015 17:12:15 EDT Ventricular Rate:  131 PR Interval:  144 QRS Duration: 90 QT Interval:  294 QTC Calculation: 434 R Axis:   100 Text Interpretation:  Sinus tachycardia Rightward axis Septal infarct ,  age undetermined Abnormal ECG SINCE LAST TRACING HEART RATE HAS INCREASED  Confirmed  by Ethelda ChickJACUBOWITZ  MD, Nyles Mitton [phone]([id number]) on 01/06/2015 5:47:45 PM     9:05 PM patient feels much improved after treatment with intravenous fluids, Tylenol and nebulized treatment. Chest x-ray viewed by me Results for orders placed or performed during the hospital encounter of 01/06/15  CBC with Differential  Result Value Ref Range   WBC 9.6 4.0 - 10.5 K/uL   RBC 5.56 4.22 - 5.81 MIL/uL   Hemoglobin 16.1 13.0 - 17.0 g/dL   HCT 91.446.6 78.239.0 - 95.652.0 %   MCV 83.8 78.0 - 100.0 fL   MCH 29.0 26.0 - 34.0 pg   MCHC 34.5 30.0 - 36.0 g/dL   RDW 21.314.0 08.611.5 - 57.815.5 %   Platelets 185 150 - 400 K/uL   Neutrophils Relative % 87 (H) 43 - 77 %   Neutro Abs 8.4 (H) 1.7 - 7.7 K/uL   Lymphocytes Relative 4 (L) 12 - 46 %   Lymphs Abs 0.4 (L) 0.7 - 4.0 K/uL   Monocytes Relative 9 3 - 12 %   Monocytes Absolute 0.8 0.1 - 1.0 K/uL   Eosinophils Relative 0 0 - 5 %   Eosinophils Absolute 0.0 0.0 - 0.7 K/uL   Basophils Relative 0 0 - 1 %   Basophils Absolute 0.0 0.0 - 0.1 K/uL  I-Stat Chem 8, ED  Result Value Ref Range   Sodium 139 135 - 145 mmol/L   Potassium 3.8 3.5 - 5.1 mmol/L   Chloride 102 96 - 112 mmol/L   BUN 14 6 - 23 mg/dL   Creatinine, Ser 4.691.20 0.50 - 1.35 mg/dL   Glucose, Bld 629109 (H) 70 - 99 mg/dL   Calcium, Ion 5.281.13 4.131.12 - 1.23 mmol/L   TCO2 19 0 - 100 mmol/L   Hemoglobin 18.0 (H) 13.0 - 17.0 g/dL   HCT 24.453.0 (H) 01.039.0 - 27.252.0 %  I-Stat Troponin, ED ([facility])  Result Value Ref Range   Troponin i, poc 0.00 0.00 - 0.08 ng/mL   Comment 3           Dg Chest 2 View  01/06/2015   CLINICAL DATA:  Pt states that he woke up at 4am with sharp chest pains and pressure  EXAM: CHEST  2 VIEW  COMPARISON:  04/28/2014  FINDINGS: Normal heart, mediastinum and hila. Clear lungs. No pleural effusion or pneumothorax. Skeletal structures are unremarkable.  IMPRESSION: No active cardiopulmonary disease.   Electronically Signed   By: Amie Portlandavid  Ormond M.D.   On: 01/06/2015 18:14  chest xray viewed by me  MDM  Plan he can stop  prednisone. Felt to have influenza-like illness. Spent 5 minutes counseling patient on smoking cessation Tylenol encourage oral hydration. Follow-up with PMD if not better by next week Diagnosis #1 influenza-like illness #2 tobacco abuse Final diagnoses:  None        Doug SouSam Annalese Stiner, MD 01/06/15 2110

## 2015-01-06 NOTE — Discharge Instructions (Signed)
Viral Infections Take Tylenol as directed every 4 hours for temperature higher than 100.4 while awake. Make sure that you drink lots of fluids. Drink at least six 8 ounce glasses of water or Gatorade each day. Use your inhalers as directed for cough or shortness of breath. Stop prednisone. Ask your primary care physician to help you to stop smoking. See your primary care physician if not better by  next week. Return if you feel worse for any reason A virus is a type of germ. Viruses can cause:  Minor sore throats.  Aches and pains.  Headaches.  Runny nose.  Rashes.  Watery eyes.  Tiredness.  Coughs.  Loss of appetite.  Feeling sick to your stomach (nausea).  Throwing up (vomiting).  Watery poop (diarrhea). HOME CARE   Only take medicines as told by your doctor.  Drink enough water and fluids to keep your pee (urine) clear or pale yellow. Sports drinks are a good choice.  Get plenty of rest and eat healthy. Soups and broths with crackers or rice are fine. GET HELP RIGHT AWAY IF:   You have a very bad headache.  You have shortness of breath.  You have chest pain or neck pain.  You have an unusual rash.  You cannot stop throwing up.  You have watery poop that does not stop.  You cannot keep fluids down.  You or your child has a temperature by mouth above 102 F (38.9 C), not controlled by medicine.  Your baby is older than 3 months with a rectal temperature of 102 F (38.9 C) or higher.  Your baby is 533 months old or younger with a rectal temperature of 100.4 F (38 C) or higher. MAKE SURE YOU:   Understand these instructions.  Will watch this condition.  Will get help right away if you are not doing well or get worse. Document Released: 08/12/2008 Document Revised: 11/22/2011 Document Reviewed: 01/05/2011 [facility] Patient Information 2015 [place]. This information is not intended to replace advice given to you by your health care provider. Make  sure you discuss any questions you have with your health care provider.

## 2015-05-06 ENCOUNTER — Encounter (HOSPITAL_COMMUNITY): Payer: Self-pay | Admitting: Emergency Medicine

## 2015-05-06 ENCOUNTER — Other Ambulatory Visit (HOSPITAL_COMMUNITY)
Admission: RE | Admit: 2015-05-06 | Discharge: 2015-05-06 | Disposition: A | Discharge status: Home / Self Care | Payer: BLUE CROSS/BLUE SHIELD | Source: Ambulatory Visit | Attending: Emergency Medicine | Admitting: Emergency Medicine

## 2015-05-06 ENCOUNTER — Emergency Department (HOSPITAL_COMMUNITY)
Admission: EM | Admit: 2015-05-06 | Discharge: 2015-05-06 | Disposition: A | Discharge status: Home / Self Care | Payer: BLUE CROSS/BLUE SHIELD | Source: Home / Self Care | Attending: Emergency Medicine | Admitting: Emergency Medicine

## 2015-05-06 DIAGNOSIS — Z113 Encounter for screening for infections with a predominantly sexual mode of transmission: Secondary | ICD-10-CM | POA: Diagnosis present

## 2015-05-06 DIAGNOSIS — K6289 Other specified diseases of anus and rectum: Secondary | ICD-10-CM

## 2015-05-06 DIAGNOSIS — Z7251 High risk heterosexual behavior: Secondary | ICD-10-CM

## 2015-05-06 DIAGNOSIS — K648 Other hemorrhoids: Secondary | ICD-10-CM

## 2015-05-06 MED ORDER — LIDOCAINE-HYDROCORTISONE ACE 3-1 % RE KIT: 1.0000 "application " | PACK | Freq: Two times a day (BID) | RECTAL | Status: DC

## 2015-05-06 MED ORDER — TRAMADOL HCL 50 MG PO TABS: ORAL_TABLET | ORAL | Status: DC

## 2015-05-06 NOTE — Discharge Instructions (Signed)
Hemorrhoids Hemorrhoids are puffy (swollen) veins around the rectum or anus. Hemorrhoids can cause pain, itching, bleeding, or irritation. HOME CARE  Eat foods with fiber, such as whole grains, beans, nuts, fruits, and vegetables. Ask your doctor about taking products with added fiber in them (fibersupplements).  Drink enough fluid to keep your pee (urine) clear or pale yellow.  Exercise often.  Go to the bathroom when you have the urge to poop. Do not wait.  Avoid straining to poop (bowel movement).  Keep the butt area dry and clean. Use wet toilet paper or moist paper towels.  Medicated creams and medicine inserted into the anus (anal suppository) may be used or applied as told.  Only take medicine as told by your doctor.  Take a warm water bath (sitz bath) for 15-20 minutes to ease pain. Do this 3-4 times a day.  Place ice packs on the area if it is tender or puffy. Use the ice packs between the warm water baths.  Put ice in a plastic bag.  Place a towel between your skin and the bag.  Leave the ice on for 15-20 minutes, 03-04 times a day.  Do not use a donut-shaped pillow or sit on the toilet for a long time. GET HELP RIGHT AWAY IF:   You have more pain that is not controlled by treatment or medicine.  You have bleeding that will not stop.  You have trouble or are unable to poop (bowel movement).  You have pain or puffiness outside the area of the hemorrhoids. MAKE SURE YOU:   Understand these instructions.  Will watch your condition.  Will get help right away if you are not doing well or get worse. Document Released: 06/08/2008 Document Revised: 08/16/2012 Document Reviewed: 07/11/2012 [facility] Patient Information 2015 [place], [place]. This information is not intended to replace advice given to you by your health care provider. Make sure you discuss any questions you have with your health care provider.  Proctalgia Fugax Proctalgia fugax is a very short  episode of intense rectal pain. It can last from seconds to minutes. It often occurs in the night, and awakens the person from sleep. It is not a sign of cancer.  CAUSES  The cause of this often intense rectal pain is not known. One possible cause may be spasm of the pelvic muscles or of the lowest part of the large intestine.  SYMPTOMS  The pain of proctalgia fugax:  Is intensely severe.  Lasts from only a few seconds to thirty minutes.  Usually awakens the person from sleep. DIAGNOSIS  In order to make sure that there are no other problems, diagnostic tests may be done such as:   Anoscopy. This is a lighted scope that is put into the rectum to look for abnormalities.  Barium enema. X-rays are taken after administering a radio-sensitive material. TREATMENT  A number of things have been used to try to treat this condition, including:  Medications.  Warm baths.  Relaxation techniques.  Gentle massage of the painful area. HOME CARE INSTRUCTIONS   Take all medications exactly as directed.  Follow any prescribed diet.  Follow instructions regarding both rest and physical activity.  Learn progressive relaxation techniques. SEEK IMMEDIATE MEDICAL CARE IF:   Your pain does not get better in the usual amount of time.  You develop any new symptoms. Document Released: 05/25/2001 Document Revised: 11/22/2011 Document Reviewed: 10/31/2008 [facility] Patient Information 2015 [place], [place]. This information is not intended to replace advice given to  you by your health care provider. Make sure you discuss any questions you have with your health care provider.  Safe Sex Safe sex is about reducing the risk of giving or getting a sexually transmitted disease (STD). STDs are spread through sexual contact involving the genitals, mouth, or rectum. Some STDs can be cured and others cannot. Safe sex can also prevent unintended pregnancies.  WHAT ARE SOME SAFE SEX PRACTICES?  Limit your  sexual activity to only one partner who is having sex with only you.  Talk to your partner about his or her past partners, past STDs, and drug use.  Use a condom every time you have sexual intercourse. This includes vaginal, oral, and anal sexual activity. Both females and males should wear condoms during oral sex. Only use latex or polyurethane condoms and water-based lubricants. Using petroleum-based lubricants or oils to lubricate a condom will weaken the condom and increase the chance that it will break. The condom should be in place from the beginning to the end of sexual activity. Wearing a condom reduces, but does not completely eliminate, your risk of getting or giving an STD. STDs can be spread by contact with infected body fluids and skin.  Get vaccinated for hepatitis B and HPV.  Avoid alcohol and recreational drugs, which can affect your judgment. You may forget to use a condom or participate in high-risk sex.  For females, avoid douching after sexual intercourse. Douching can spread an infection farther into the reproductive tract.  Check your body for signs of sores, blisters, rashes, or unusual discharge. See your health care provider if you notice any of these signs.  Avoid sexual contact if you have symptoms of an infection or are being treated for an STD. If you or your partner has herpes, avoid sexual contact when blisters are present. Use condoms at all other times.  If you are at risk of being infected with HIV, it is recommended that you take a prescription medicine daily to prevent HIV infection. This is called pre-exposure prophylaxis (PrEP). You are considered at risk if:  You are a man who has sex with other men (MSM).  You are a heterosexual man or woman who is sexually active with more than one partner.  You take drugs by injection.  You are sexually active with a partner who has HIV.  Talk with your health care provider about whether you are at high risk of  being infected with HIV. If you choose to begin PrEP, you should first be tested for HIV. You should then be tested every 3 months for as long as you are taking PrEP.  See your health care provider for regular screenings, exams, and tests for other STDs. Before having sex with a new partner, each of you should be screened for STDs and should talk about the results with each other. WHAT ARE THE BENEFITS OF SAFE SEX?   There is less chance of getting or giving an STD.  You can prevent unwanted or unintended pregnancies.  By discussing safe sex concerns with your partner, you may increase feelings of intimacy, comfort, trust, and honesty between the two of you. Document Released: 10/07/2004 Document Revised: 01/14/2014 Document Reviewed: 02/21/2012 [facility] Patient Information 2015 [place], [place]. This information is not intended to replace advice given to you by your health care provider. Make sure you discuss any questions you have with your health care provider.

## 2015-05-06 NOTE — ED Provider Notes (Signed)
CSN: [id number]     Arrival date & time 05/06/15  87 History   First MD Initiated Contact with Patient 05/06/15 1801     Chief Complaint  Patient presents with  . Rectal Pain   (Consider location/radiation/quality/duration/timing/severity/associated sxs/prior Treatment) HPI Comments: 39 year old man complaining of moderate to severe rectal pain for 3 days. He states he feels a knot in his rectum that is more to the left side. It is worse when he coughs and sits and with prolonged standing. He also has pain at the end of a bowel movement when he is straining. He has not seen any bleeding or abnormal discharge from the rectum. He states that he had a mild fever yesterday.  After the H and P the patient asked if I would check for STDs because he states he was "swinging" with other sexual partners this week and and has a tingling sometimes when he P's. Denies penile discharge or other pain. Denies having rectal sex.   Past Medical History  Diagnosis Date  . Bronchitis    History reviewed. No pertinent past surgical history. History reviewed. No pertinent family history. Social History  Substance Use Topics  . Smoking status: Current Every Day Smoker -- 0.50 packs/day    Types: Cigarettes  . Smokeless tobacco: None  . Alcohol Use: Yes    Review of Systems  Constitutional: Negative for fever, activity change and fatigue.  Respiratory: Negative for shortness of breath.   Cardiovascular: Negative for chest pain.  Gastrointestinal: Negative for nausea, vomiting, abdominal pain, diarrhea, constipation and abdominal distention.  Genitourinary: Negative for dysuria, urgency, flank pain, discharge, penile swelling, scrotal swelling, genital sores and testicular pain.  Musculoskeletal: Negative.   Skin: Negative.     Allergies  Penicillins  Home Medications   Prior to Admission medications   Medication Sig Start Date End Date Taking? Authorizing Provider  albuterol (PROVENTIL  HFA;VENTOLIN HFA) 108 (90 BASE) MCG/ACT inhaler Inhale 2 puffs into the lungs every 4 (four) hours as needed for wheezing. 04/28/14  Yes Liam Graham, PA-C  albuterol (PROVENTIL) (2.5 MG/3ML) 0.083% nebulizer solution Take 3 mLs (2.5 mg total) by nebulization every 4 (four) hours as needed for wheezing or shortness of breath. 04/28/14   Freeman Caldron Baker, PA-C  Fluticasone Furoate-Vilanterol 100-25 MCG/INH AEPB Inhale 2 puffs into the lungs daily.    Historical Provider, MD  lidocaine-hydrocortisone (ANAMANTLE) 3-1 % KIT Place 1 application rectally 2 (two) times daily. 05/06/15   Janne Napoleon, NP  traMADol (ULTRAM) 50 MG tablet 1-2 tabs po q 6 hr prn pain Maximum dose= 8 tablets per day 05/06/15   Janne Napoleon, NP   BP 148/93 mmHg  Pulse 102  Temp(Src) 99.6 F (37.6 C) (Oral)  Resp 18  SpO2 97% Physical Exam  Constitutional: He is oriented to person, place, and time. He appears well-developed and well-nourished. No distress.  Neck: Normal range of motion. Neck supple.  Cardiovascular: Normal rate.   Pulmonary/Chest: Effort normal. No respiratory distress.  Abdominal: Soft. There is no tenderness.  Genitourinary: Guaiac positive stool.  External anal exam reveals old small hemorrhoidal tag. No external tenderness. DRE reveals tenderness to the left lateral rectum within 1-2 cm past the sphincter. This produces severe pain with palpation. No nodules or masses are palpated. There is a small string-like structure palpable in the area which is also tender. No stool in the rectal vault. No bleeding or discharge is seen. No evidence of fissure.  Musculoskeletal: He exhibits no edema.  Neurological: He is alert and oriented to person, place, and time.  Skin: Skin is warm and dry.  Psychiatric: He has a normal mood and affect.  Nursing note and vitals reviewed.   ED Course  Procedures (including critical care time) Labs Review Labs Reviewed  CYTOLOGY, (ORAL, ANAL, URETHRAL) ANCILLARY ONLY      Imaging Review No results found.   MDM   1. High risk sexual behavior   2. Rectal pain   3. Hemorrhoids, internal    AnaMantle cream as directed Tramadol 50 mg #20 when necessary pain Urethral cytology for STD pending Follow-up with gastroenterology call tomorrow for an appointment.    Janne Napoleon, NP 05/06/15 [phone]

## 2015-05-06 NOTE — ED Notes (Signed)
Pt has been having pain in his rectum since Saturday.  He noticed he was starting to have trouble with bowel movements and noticed a knot on the right side of his rectum.  I was unable to see any abscess upon quick assessment, but pt is unable to sit down and is in an extreme amount of pain.

## 2015-05-08 LAB — CYTOLOGY, (ORAL, ANAL, URETHRAL) ANCILLARY ONLY
Chlamydia: NEGATIVE
Neisseria Gonorrhea: NEGATIVE
Trichomonas: NEGATIVE

## 2015-05-08 NOTE — ED Notes (Signed)
Final report of STD testing negative 

## 2015-05-12 ENCOUNTER — Emergency Department (HOSPITAL_COMMUNITY)
Admission: EM | Admit: 2015-05-12 | Discharge: 2015-05-12 | Disposition: A | Discharge status: Home / Self Care | Payer: BLUE CROSS/BLUE SHIELD | Attending: Emergency Medicine | Admitting: Emergency Medicine

## 2015-05-12 ENCOUNTER — Encounter (HOSPITAL_COMMUNITY): Payer: Self-pay | Admitting: Emergency Medicine

## 2015-05-12 DIAGNOSIS — Z8709 Personal history of other diseases of the respiratory system: Secondary | ICD-10-CM | POA: Insufficient documentation

## 2015-05-12 DIAGNOSIS — K6289 Other specified diseases of anus and rectum: Secondary | ICD-10-CM | POA: Diagnosis present

## 2015-05-12 DIAGNOSIS — L0501 Pilonidal cyst with abscess: Secondary | ICD-10-CM | POA: Insufficient documentation

## 2015-05-12 DIAGNOSIS — Z880 Allergy status to penicillin: Secondary | ICD-10-CM | POA: Diagnosis not present

## 2015-05-12 DIAGNOSIS — Z79899 Other long term (current) drug therapy: Secondary | ICD-10-CM | POA: Insufficient documentation

## 2015-05-12 DIAGNOSIS — Z720 Tobacco use: Secondary | ICD-10-CM | POA: Insufficient documentation

## 2015-05-12 MED ORDER — OXYCODONE-ACETAMINOPHEN 5-325 MG PO TABS
1.0000 | ORAL_TABLET | Freq: Once | ORAL | Status: AC
  Administered 2015-05-12: 1 via ORAL

## 2015-05-12 MED ORDER — OXYCODONE-ACETAMINOPHEN 5-325 MG PO TABS: 1.0000 | ORAL_TABLET | ORAL | Status: DC | PRN

## 2015-05-12 MED ORDER — OXYCODONE-ACETAMINOPHEN 5-325 MG PO TABS
1.0000 | ORAL_TABLET | Freq: Once | ORAL | Status: AC
  Administered 2015-05-12: 1 via ORAL
  Filled 2015-05-12: qty 1

## 2015-05-12 MED ORDER — CLINDAMYCIN HCL 300 MG PO CAPS: 300.0000 mg | ORAL_CAPSULE | Freq: Three times a day (TID) | ORAL | Status: DC

## 2015-05-12 MED ORDER — CLINDAMYCIN HCL 300 MG PO CAPS
300.0000 mg | ORAL_CAPSULE | Freq: Once | ORAL | Status: AC
  Administered 2015-05-12: 300 mg via ORAL
  Filled 2015-05-12: qty 1

## 2015-05-12 MED ORDER — OXYCODONE-ACETAMINOPHEN 5-325 MG PO TABS
ORAL_TABLET | ORAL | Status: AC
  Filled 2015-05-12: qty 1

## 2015-05-12 MED ORDER — LIDOCAINE-EPINEPHRINE (PF) 2 %-1:200000 IJ SOLN
10.0000 mL | Freq: Once | INTRAMUSCULAR | Status: AC
  Administered 2015-05-12: 10 mL
  Filled 2015-05-12: qty 20

## 2015-05-12 NOTE — ED Notes (Signed)
\  [phone]

## 2015-05-12 NOTE — ED Notes (Signed)
Pt sts buttocks and rectal pain x 7 days; pt sts unsure if have abscess but feels pressure in rectum and difficult to have BM

## 2015-05-12 NOTE — Discharge Instructions (Signed)
Take clindamycin as prescribed.  Take motrin for pain.  Take percocet for severe pain. Do NOT drive with it.  The wound will continue to drain. Change dressings when it gets soaked.   Wound check in the ED in 2 days.   Return to ER if you have fever, severe pain, more swelling and discharge.

## 2015-05-12 NOTE — ED Provider Notes (Signed)
CSN: [id number]     Arrival date & time 05/12/15  1143 History   First MD Initiated Contact with Patient 05/12/15 1652     Chief Complaint  Patient presents with  . Rectal Pain  . Abscess     (Consider location/radiation/quality/duration/timing/severity/associated sxs/prior Treatment) The history is provided by the patient.  Jay Gentry is a 39 y.o. male here with rectal pain. Patient has been having rectal pain for the last week or so. Went to urgent care on 8/23 and was thought to have hemorrhoids so was prescribed tramadol, hydrocortisone cream. He has been using a but states that he has worsening pain and swelling in that area. Denies any history of anal sex, IV drug use. No history of MRSA.   Past Medical History  Diagnosis Date  . Bronchitis    History reviewed. No pertinent past surgical history. History reviewed. No pertinent family history. Social History  Substance Use Topics  . Smoking status: Current Every Day Smoker -- 0.50 packs/day    Types: Cigarettes  . Smokeless tobacco: None  . Alcohol Use: Yes    Review of Systems  Skin: Positive for color change.  All other systems reviewed and are negative.     Allergies  Penicillins  Home Medications   Prior to Admission medications   Medication Sig Start Date End Date Taking? Authorizing Provider  albuterol (PROVENTIL HFA;VENTOLIN HFA) 108 (90 BASE) MCG/ACT inhaler Inhale 2 puffs into the lungs every 4 (four) hours as needed for wheezing. 04/28/14  Yes Liam Graham, PA-C  albuterol (PROVENTIL) (2.5 MG/3ML) 0.083% nebulizer solution Take 3 mLs (2.5 mg total) by nebulization every 4 (four) hours as needed for wheezing or shortness of breath. 04/28/14  Yes Freeman Caldron Baker, PA-C  Fluticasone Furoate-Vilanterol 100-25 MCG/INH AEPB Inhale 2 puffs into the lungs daily.   Yes Historical Provider, MD  lidocaine-hydrocortisone (ANAMANTLE) 3-1 % KIT Place 1 application rectally 2 (two) times daily. 05/06/15  Yes Janne Napoleon, NP  traMADol (ULTRAM) 50 MG tablet Take 50 mg by mouth every 6 (six) hours as needed. 05/06/15  Yes Historical Provider, MD   BP 110/84 mmHg  Pulse 84  Temp(Src) 98.6 F (37 C) (Oral)  Resp 20  SpO2 100% Physical Exam  Constitutional: He is oriented to person, place, and time.  Uncomfortable   HENT:  Head: Normocephalic.  Mouth/Throat: Oropharynx is clear and moist.  Eyes: Pupils are equal, round, and reactive to light.  Neck: Normal range of motion.  Cardiovascular: Normal rate.   Pulmonary/Chest: Effort normal.  Abdominal: Soft. Bowel sounds are normal. He exhibits no distension. There is no tenderness. There is no rebound.  Genitourinary:  Rectum- swelling L pilonidal area with fluctuance. Not extending to rectum. No obvious hemorrhoids   Musculoskeletal: Normal range of motion. He exhibits no edema.  Neurological: He is alert and oriented to person, place, and time. No cranial nerve deficit. Coordination normal.  Skin: Skin is warm and dry.  Psychiatric: He has a normal mood and affect. His behavior is normal. Judgment and thought content normal.  Nursing note and vitals reviewed.   ED Course  Procedures (including critical care time)  EMERGENCY DEPARTMENT US SOFT TISSUE INTERPRETATION "Study: Limited Ultrasound of the noted body part in comments below"  INDICATIONS: Soft tissue infection Multiple views of the body part are obtained with a multi-frequency linear probe  PERFORMED BY:  Myself  IMAGES ARCHIVED?: Yes  SIDE:Right   BODY PART:Lower back  FINDINGS: Abcess present and Cellulitis  present  LIMITATIONS:  Body Habitus  INTERPRETATION:  Abcess present and Cellulitis present  COMMENT:  + abscess, mild cellulitis   INCISION AND DRAINAGE Performed by: Darl Householder, DAVID Consent: Verbal consent obtained. Risks and benefits: risks, benefits and alternatives were discussed Type: abscess  Body area: pilonidal  Anesthesia: local infiltration  Incision was  made with a scalpel.  Local anesthetic: lidocaine 2 % with epinephrine  Anesthetic total: 15 ml  Complexity: complex Blunt dissection to break up loculations  Drainage: purulent  Drainage amount: scant  Packing material: none   Patient tolerance: Patient tolerated the procedure well with no immediate complications.       Labs Review Labs Reviewed - No data to display  Imaging Review No results found. I have personally reviewed and evaluated these images and lab results as part of my medical decision-making.   EKG Interpretation None      MDM   Final diagnoses:  None   Jay Gentry is a 40 y.o. male here with rectal pain. Concerned for possible pilonidal abscess. No rectal involvement. No hx of MRSA. Bedside US showed abscess pocket. I and D performed but has mainly serosanguinous drainage with mild purulent drainage. Will give clinda. Vitals stable. Will bring back for wound check in 2 days.      Wandra Arthurs, MD 05/12/15 [phone]

## 2015-05-14 ENCOUNTER — Encounter (HOSPITAL_COMMUNITY): Admission: EM | Disposition: A | Discharge status: Home / Self Care | Payer: Self-pay | Source: Home / Self Care

## 2015-05-14 ENCOUNTER — Encounter (HOSPITAL_COMMUNITY): Payer: Self-pay | Admitting: Emergency Medicine

## 2015-05-14 ENCOUNTER — Emergency Department (HOSPITAL_COMMUNITY): Payer: BLUE CROSS/BLUE SHIELD

## 2015-05-14 ENCOUNTER — Inpatient Hospital Stay (HOSPITAL_COMMUNITY)
Admission: EM | Admit: 2015-05-14 | Discharge: 2015-05-15 | DRG: 331 | Disposition: A | Discharge status: Home / Self Care | Payer: BLUE CROSS/BLUE SHIELD | Attending: Surgery | Admitting: Surgery

## 2015-05-14 DIAGNOSIS — K611 Rectal abscess: Secondary | ICD-10-CM

## 2015-05-14 DIAGNOSIS — Z79899 Other long term (current) drug therapy: Secondary | ICD-10-CM

## 2015-05-14 DIAGNOSIS — Z880 Allergy status to penicillin: Secondary | ICD-10-CM

## 2015-05-14 DIAGNOSIS — Z79891 Long term (current) use of opiate analgesic: Secondary | ICD-10-CM | POA: Diagnosis not present

## 2015-05-14 DIAGNOSIS — F17210 Nicotine dependence, cigarettes, uncomplicated: Secondary | ICD-10-CM | POA: Diagnosis present

## 2015-05-14 HISTORY — PX: INCISION AND DRAINAGE PERIRECTAL ABSCESS: SHX1804

## 2015-05-14 HISTORY — DX: Rectal abscess: K61.1

## 2015-05-14 LAB — I-STAT CHEM 8, ED
BUN: 17 mg/dL (ref 6–20)
Calcium, Ion: 1.1 mmol/L — ABNORMAL LOW (ref 1.12–1.23)
Chloride: 100 mmol/L — ABNORMAL LOW (ref 101–111)
Creatinine, Ser: 1.2 mg/dL (ref 0.61–1.24)
Glucose, Bld: 97 mg/dL (ref 65–99)
HCT: 50 % (ref 39.0–52.0)
Hemoglobin: 17 g/dL (ref 13.0–17.0)
Potassium: 4.2 mmol/L (ref 3.5–5.1)
Sodium: 137 mmol/L (ref 135–145)
TCO2: 27 mmol/L (ref 0–100)

## 2015-05-14 LAB — CBC
HCT: 44.6 % (ref 39.0–52.0)
Hemoglobin: 15.3 g/dL (ref 13.0–17.0)
MCH: 29.3 pg (ref 26.0–34.0)
MCHC: 34.3 g/dL (ref 30.0–36.0)
MCV: 85.3 fL (ref 78.0–100.0)
Platelets: 309 10*3/uL (ref 150–400)
RBC: 5.23 MIL/uL (ref 4.22–5.81)
RDW: 13.2 % (ref 11.5–15.5)
WBC: 17.1 10*3/uL — ABNORMAL HIGH (ref 4.0–10.5)

## 2015-05-14 SURGERY — INCISION AND DRAINAGE, ABSCESS, PERIRECTAL
Anesthesia: General | Site: Rectum

## 2015-05-14 MED ORDER — HYDROMORPHONE HCL 1 MG/ML IJ SOLN
1.0000 mg | Freq: Once | INTRAMUSCULAR | Status: AC
Start: 2015-05-14 — End: 2015-05-14
  Administered 2015-05-14: 1 mg via INTRAVENOUS
  Filled 2015-05-14: qty 1

## 2015-05-14 MED ORDER — HYDROMORPHONE HCL 1 MG/ML IJ SOLN
1.0000 mg | Freq: Once | INTRAMUSCULAR | Status: AC
  Administered 2015-05-14: 1 mg via INTRAVENOUS
  Filled 2015-05-14: qty 1

## 2015-05-14 MED ORDER — FENTANYL CITRATE (PF) 250 MCG/5ML IJ SOLN
INTRAMUSCULAR | Status: AC
  Filled 2015-05-14: qty 5

## 2015-05-14 MED ORDER — ROCURONIUM BROMIDE 50 MG/5ML IV SOLN
INTRAVENOUS | Status: AC
  Filled 2015-05-14: qty 1

## 2015-05-14 MED ORDER — PROPOFOL 10 MG/ML IV BOLUS
INTRAVENOUS | Status: AC
  Filled 2015-05-14: qty 20

## 2015-05-14 MED ORDER — SUCCINYLCHOLINE CHLORIDE 20 MG/ML IJ SOLN
INTRAMUSCULAR | Status: AC
  Filled 2015-05-14: qty 4

## 2015-05-14 MED ORDER — ONDANSETRON HCL 4 MG/2ML IJ SOLN
4.0000 mg | Freq: Once | INTRAMUSCULAR | Status: AC
  Administered 2015-05-14: 4 mg via INTRAVENOUS
  Filled 2015-05-14: qty 2

## 2015-05-14 MED ORDER — MIDAZOLAM HCL 2 MG/2ML IJ SOLN
INTRAMUSCULAR | Status: AC
  Filled 2015-05-14: qty 4

## 2015-05-14 MED ORDER — SODIUM CHLORIDE 0.9 % IV BOLUS (SEPSIS)
1000.0000 mL | Freq: Once | INTRAVENOUS | Status: AC
  Administered 2015-05-14: 1000 mL via INTRAVENOUS

## 2015-05-14 MED ORDER — IOHEXOL 300 MG/ML  SOLN
80.0000 mL | Freq: Once | INTRAMUSCULAR | Status: AC | PRN
  Administered 2015-05-14: 100 mL via INTRAVENOUS

## 2015-05-14 MED ORDER — ONDANSETRON HCL 4 MG/2ML IJ SOLN
INTRAMUSCULAR | Status: AC
  Filled 2015-05-14: qty 2

## 2015-05-14 SURGICAL SUPPLY — 31 items
BNDG GAUZE ELAST 4 BULKY (GAUZE/BANDAGES/DRESSINGS) IMPLANT
COVER MAYO STAND STRL (DRAPES) ×2 IMPLANT
COVER SURGICAL LIGHT HANDLE (MISCELLANEOUS) ×2 IMPLANT
DRAIN PENROSE 1X12 LTX STRL (DRAIN) ×2 IMPLANT
DRAPE UTILITY XL STRL (DRAPES) ×4 IMPLANT
DRSG PAD ABDOMINAL 8X10 ST (GAUZE/BANDAGES/DRESSINGS) ×2 IMPLANT
ELECT CAUTERY BLADE 6.4 (BLADE) ×2 IMPLANT
ELECT REM PT RETURN 9FT ADLT (ELECTROSURGICAL) ×2
ELECTRODE REM PT RTRN 9FT ADLT (ELECTROSURGICAL) ×1 IMPLANT
GAUZE SPONGE 4X4 12PLY STRL (GAUZE/BANDAGES/DRESSINGS) IMPLANT
GLOVE BIO SURGEON STRL SZ7 (GLOVE) ×2 IMPLANT
GLOVE BIOGEL PI IND STRL 7.5 (GLOVE) ×1 IMPLANT
GLOVE BIOGEL PI INDICATOR 7.5 (GLOVE) ×1
GOWN STRL REUS W/ TWL LRG LVL3 (GOWN DISPOSABLE) ×2 IMPLANT
GOWN STRL REUS W/TWL LRG LVL3 (GOWN DISPOSABLE) ×4
KIT BASIN OR (CUSTOM PROCEDURE TRAY) ×2 IMPLANT
KIT ROOM TURNOVER OR (KITS) ×2 IMPLANT
NS IRRIG 1000ML POUR BTL (IV SOLUTION) ×2 IMPLANT
PACK LITHOTOMY IV (CUSTOM PROCEDURE TRAY) ×2 IMPLANT
PAD ARMBOARD 7.5X6 YLW CONV (MISCELLANEOUS) ×4 IMPLANT
PENCIL BUTTON HOLSTER BLD 10FT (ELECTRODE) ×2 IMPLANT
SPONGE GAUZE 4X4 12PLY STER LF (GAUZE/BANDAGES/DRESSINGS) ×2 IMPLANT
SPONGE LAP 18X18 X RAY DECT (DISPOSABLE) ×2 IMPLANT
SURGILUBE 2OZ TUBE FLIPTOP (MISCELLANEOUS) IMPLANT
SUT ETHILON 2 0 FS 18 (SUTURE) ×2 IMPLANT
SYR BULB 3OZ (MISCELLANEOUS) ×2 IMPLANT
TOWEL OR 17X24 6PK STRL BLUE (TOWEL DISPOSABLE) ×2 IMPLANT
TOWEL OR 17X26 10 PK STRL BLUE (TOWEL DISPOSABLE) ×2 IMPLANT
TUBE CONNECTING 12X1/4 (SUCTIONS) ×2 IMPLANT
WATER STERILE IRR 1000ML POUR (IV SOLUTION) IMPLANT
YANKAUER SUCT BULB TIP NO VENT (SUCTIONS) ×2 IMPLANT

## 2015-05-14 NOTE — ED Notes (Signed)
Pt reports having abscess drained on Monday with worsening pain today and feels like he may have another one.

## 2015-05-14 NOTE — H&P (Signed)
Henley Boettner is an 39 y.o. male.   Chief Complaint: Severe perirectal pain HPI: This is a healthy 39 yo male who presents with a one week history of worsening left perirectal pain and swelling.  He was seen in the ED two days ago, and an attempt was made to drain a "pilonidal" abscess.  However, the pain and swelling have worsened significantly and now the patient is unable to have bowel movements.  He returned to the ED and a CT pelvis was obtained.  Past Medical History  Diagnosis Date  . Bronchitis     History reviewed. No pertinent past surgical history.  No family history on file. Social History:  reports that he has been smoking Cigarettes.  He has been smoking about 0.50 packs per day. He does not have any smokeless tobacco history on file. He reports that he drinks alcohol. He reports that he does not use illicit drugs.  Allergies:  Allergies  Allergen Reactions  . Penicillins Hives    Prior to Admission medications   Medication Sig Start Date End Date Taking? Authorizing Provider  albuterol (PROVENTIL HFA;VENTOLIN HFA) 108 (90 BASE) MCG/ACT inhaler Inhale 2 puffs into the lungs every 4 (four) hours as needed for wheezing. 04/28/14  Yes Liam Graham, PA-C  albuterol (PROVENTIL) (2.5 MG/3ML) 0.083% nebulizer solution Take 3 mLs (2.5 mg total) by nebulization every 4 (four) hours as needed for wheezing or shortness of breath. 04/28/14  Yes Freeman Caldron Baker, PA-C  clindamycin (CLEOCIN) 300 MG capsule Take 1 capsule (300 mg total) by mouth 3 (three) times daily. X 7 days 05/12/15  Yes Wandra Arthurs, MD  Fluticasone Furoate-Vilanterol 100-25 MCG/INH AEPB Inhale 2 puffs into the lungs daily.   Yes Historical Provider, MD  oxyCODONE-acetaminophen (PERCOCET) 5-325 MG per tablet Take 1-2 tablets by mouth every 4 (four) hours as needed. Patient taking differently: Take 1-2 tablets by mouth every 4 (four) hours as needed for moderate pain.  05/12/15  Yes Wandra Arthurs, MD   lidocaine-hydrocortisone (ANAMANTLE) 3-1 % KIT Place 1 application rectally 2 (two) times daily. Patient not taking: Reported on 05/14/2015 05/06/15   Janne Napoleon, NP     Results for orders placed or performed during the hospital encounter of 05/14/15 (from the past 48 hour(s))  CBC     Status: Abnormal   Collection Time: 05/14/15  7:21 PM  Result Value Ref Range   WBC 17.1 (H) 4.0 - 10.5 K/uL   RBC 5.23 4.22 - 5.81 MIL/uL   Hemoglobin 15.3 13.0 - 17.0 g/dL   HCT 44.6 39.0 - 52.0 %   MCV 85.3 78.0 - 100.0 fL   MCH 29.3 26.0 - 34.0 pg   MCHC 34.3 30.0 - 36.0 g/dL   RDW 13.2 11.5 - 15.5 %   Platelets 309 150 - 400 K/uL  I-stat chem 8, ed     Status: Abnormal   Collection Time: 05/14/15  8:17 PM  Result Value Ref Range   Sodium 137 135 - 145 mmol/L   Potassium 4.2 3.5 - 5.1 mmol/L   Chloride 100 (L) 101 - 111 mmol/L   BUN 17 6 - 20 mg/dL   Creatinine, Ser 1.20 0.61 - 1.24 mg/dL   Glucose, Bld 97 65 - 99 mg/dL   Calcium, Ion 1.10 (L) 1.12 - 1.23 mmol/L   TCO2 27 0 - 100 mmol/L   Hemoglobin 17.0 13.0 - 17.0 g/dL   HCT 50.0 39.0 - 52.0 %   Ct Abdomen Pelvis  W Contrast  05/14/2015   CLINICAL DATA:  Gluteal abscess, pain with flatus. Unable to defecate.  EXAM: CT ABDOMEN AND PELVIS WITH CONTRAST  TECHNIQUE: Multidetector CT imaging of the abdomen and pelvis was performed using the standard protocol following bolus administration of intravenous contrast.  CONTRAST:  148m OMNIPAQUE IOHEXOL 300 MG/ML  SOLN  COMPARISON:  None.  FINDINGS: Lower chest:  Clear lung bases.  Normal heart size.  Hepatobiliary: 8 mm hypodensity in the right hepatic lobe which is too small to characterize. No other focal hepatic mass. Normal gallbladder.  Pancreas: Normal pancreas.  Spleen: Normal spleen.  Adrenals/Urinary Tract: Normal adrenal glands. Normal right kidney. Multiple small subcentimeter hypodensities in the left kidney which are too small to characterize, but statistically represent small cysts. No  obstructive uropathy. Normal bladder.  Stomach/Bowel: No bowel dilatation or bowel wall thickening. Normal caliber appendix without periappendiceal inflammatory changes. No pneumatosis, pneumoperitoneum or portal venous gas.  Vascular/Lymphatic: Normal caliber abdominal aorta with minimal atherosclerosis. No abdominal or pelvic lymphadenopathy.  Other: There is a 7.2 x 8.4 x 6.8 cm complex perirectal fluid collection with air within the fluid and surrounding inflammatory changes. The fluid collection extends into the left gluteal subcutaneous fat.  Musculoskeletal: No aggressive lytic or sclerotic osseous lesion. Small sclerotic focus in the right femoral neck consistent with a bone island. No acute osseous abnormality.  IMPRESSION: 1. Large perirectal abscess measuring 7.2 x 8.4 x 6.8 cm extending into the left gluteal subcutaneous fat.   Electronically Signed   By: HKathreen Devoid  On: 05/14/2015 21:55    Review of Systems  Constitutional: Negative for weight loss.  HENT: Negative for ear discharge, ear pain, hearing loss and tinnitus.   Eyes: Negative for blurred vision, double vision, photophobia and pain.  Respiratory: Negative for cough, sputum production and shortness of breath.   Cardiovascular: Negative for chest pain.  Gastrointestinal: Positive for constipation. Negative for nausea, vomiting and abdominal pain.       Left buttock/ perirectal pain  Genitourinary: Negative for dysuria, urgency, frequency and flank pain.  Musculoskeletal: Negative for myalgias, back pain, joint pain, falls and neck pain.  Neurological: Negative for dizziness, tingling, sensory change, focal weakness, loss of consciousness and headaches.  Endo/Heme/Allergies: Does not bruise/bleed easily.  Psychiatric/Behavioral: Negative for depression, memory loss and substance abuse. The patient is not nervous/anxious.     Blood pressure 102/70, pulse 86, temperature 99.5 F (37.5 C), temperature source Oral, resp. rate  18, height '5\' 8"'  (1.727 m), weight 81.647 kg (180 lb), SpO2 99 %. Physical Exam  WDWN in NAD HEENT:  EOMI, sclera anicteric Neck:  No masses, no thyromegaly Lungs:  CTA bilaterally; normal respiratory effort CV:  Regular rate and rhythm; no murmurs Abd:  +bowel sounds, soft, non-tender, no masses Perineum - significant induration/ tenderness left buttock/ left perirectal region; exquisitely tender; warm; no right sided symptoms Ext:  Well-perfused; no edema Skin:  Warm, dry; no sign of jaundice  Assessment/Plan 1.  Large left perirectal abscess  Will proceed to OR for incision and drainage of large perirectal abscess.  The surgical procedure has been discussed with the patient.  Potential risks, benefits, alternative treatments, and expected outcomes have been explained.  All of the patient's questions at this time have been answered.  The likelihood of reaching the patient's treatment goal is good.  The patient understand the proposed surgical procedure and wishes to proceed.   Jesalyn Finazzo K. 05/14/2015, 11:30 PM

## 2015-05-14 NOTE — ED Notes (Signed)
Dr. Rhunette Croft bedside speaking to Pt.

## 2015-05-14 NOTE — ED Provider Notes (Signed)
CSN: [id number]     Arrival date & time 05/14/15  1814 History  This chart was scribed for non-physician practitioner, Margarita Mail, PA-C, working with Varney Biles, MD, by Helane Gunther ED Scribe. This patient was seen in room TR01C/TR01C and the patient's care was started at 6:40 PM      Chief Complaint  Patient presents with  . Abscess   The history is provided by the patient. No language interpreter was used.   HPI Comments: Jay Gentry is a 39 y.o. male who presents to the Emergency Department complaining of a worsening, painful abscess onset about a week ago. He reports associated mild bloody drainage, worsening swelling and erythema. He also notes pain with straining, and difficulty passing gas or a BM because of the swelling. Pt states he has to pull one of his cheeks to the side to be able to do either. He notes difficulty walking and sitting due to the pain. He has been seen in the ED 2 days ago, when he underwent an I&D procedure to drain the abscess, as well as being prescribed clyndamycin. He states he has been taking his prescribed antibiotics.  Past Medical History  Diagnosis Date  . Bronchitis    History reviewed. No pertinent past surgical history. No family history on file. Social History  Substance Use Topics  . Smoking status: Current Every Day Smoker -- 0.50 packs/day    Types: Cigarettes  . Smokeless tobacco: None  . Alcohol Use: Yes    Review of Systems  Gastrointestinal: Positive for rectal pain.  Skin: Positive for wound.    Allergies  Penicillins  Home Medications   Prior to Admission medications   Medication Sig Start Date End Date Taking? Authorizing Provider  albuterol (PROVENTIL HFA;VENTOLIN HFA) 108 (90 BASE) MCG/ACT inhaler Inhale 2 puffs into the lungs every 4 (four) hours as needed for wheezing. 04/28/14   Liam Graham, PA-C  albuterol (PROVENTIL) (2.5 MG/3ML) 0.083% nebulizer solution Take 3 mLs (2.5 mg total) by nebulization  every 4 (four) hours as needed for wheezing or shortness of breath. 04/28/14   Liam Graham, PA-C  clindamycin (CLEOCIN) 300 MG capsule Take 1 capsule (300 mg total) by mouth 3 (three) times daily. X 7 days 05/12/15   Wandra Arthurs, MD  Fluticasone Furoate-Vilanterol 100-25 MCG/INH AEPB Inhale 2 puffs into the lungs daily.    Historical Provider, MD  lidocaine-hydrocortisone (ANAMANTLE) 3-1 % KIT Place 1 application rectally 2 (two) times daily. 05/06/15   Janne Napoleon, NP  oxyCODONE-acetaminophen (PERCOCET) 5-325 MG per tablet Take 1-2 tablets by mouth every 4 (four) hours as needed. 05/12/15   Wandra Arthurs, MD  traMADol (ULTRAM) 50 MG tablet Take 50 mg by mouth every 6 (six) hours as needed. 05/06/15   Historical Provider, MD   BP 104/63 mmHg  Pulse 77  Temp(Src) 99.5 F (37.5 C) (Oral)  Resp 18  Ht '5\' 8"'  (1.727 m)  Wt 180 lb (81.647 kg)  BMI 27.38 kg/m2  SpO2 97% Physical Exam  Constitutional: He appears well-developed and well-nourished. No distress.  HENT:  Head: Normocephalic and atraumatic.  Eyes: Conjunctivae are normal. Right eye exhibits no discharge. Left eye exhibits no discharge. No scleral icterus.  Neck: Normal range of motion. Neck supple.  Cardiovascular: Normal rate, regular rhythm and normal heart sounds.   Pulmonary/Chest: Effort normal and breath sounds normal. No respiratory distress.  Abdominal: Soft. There is no tenderness.  Genitourinary:  L Buttock: Small incision on the cheek,  no active drainage. Significant induration to the gluteal muscle. Exquisitely tender to palpation, warm  Musculoskeletal: He exhibits no edema.  Neurological: He is alert. Coordination normal.  Skin: Skin is warm and dry. No rash noted. He is not diaphoretic. No erythema.  Psychiatric: He has a normal mood and affect. His behavior is normal.  Nursing note and vitals reviewed.   ED Course  Procedures  DIAGNOSTIC STUDIES: Oxygen Saturation is 98% on RA, normal by my interpretation.     COORDINATION OF CARE: 6:46 PM - Discussed possible cellulitis or extended abscess. Discussed plans to order diagnostic studies and imaging. Pt advised of plan for treatment and pt agrees.  Labs Review Labs Reviewed  CBC - Abnormal; Notable for the following:    WBC 17.1 (*)    All other components within normal limits  I-STAT CHEM 8, ED - Abnormal; Notable for the following:    Chloride 100 (*)    Calcium, Ion 1.10 (*)    All other components within normal limits    Imaging Review Ct Abdomen Pelvis W Contrast  05/14/2015   CLINICAL DATA:  Gluteal abscess, pain with flatus. Unable to defecate.  EXAM: CT ABDOMEN AND PELVIS WITH CONTRAST  TECHNIQUE: Multidetector CT imaging of the abdomen and pelvis was performed using the standard protocol following bolus administration of intravenous contrast.  CONTRAST:  163m OMNIPAQUE IOHEXOL 300 MG/ML  SOLN  COMPARISON:  None.  FINDINGS: Lower chest:  Clear lung bases.  Normal heart size.  Hepatobiliary: 8 mm hypodensity in the right hepatic lobe which is too small to characterize. No other focal hepatic mass. Normal gallbladder.  Pancreas: Normal pancreas.  Spleen: Normal spleen.  Adrenals/Urinary Tract: Normal adrenal glands. Normal right kidney. Multiple small subcentimeter hypodensities in the left kidney which are too small to characterize, but statistically represent small cysts. No obstructive uropathy. Normal bladder.  Stomach/Bowel: No bowel dilatation or bowel wall thickening. Normal caliber appendix without periappendiceal inflammatory changes. No pneumatosis, pneumoperitoneum or portal venous gas.  Vascular/Lymphatic: Normal caliber abdominal aorta with minimal atherosclerosis. No abdominal or pelvic lymphadenopathy.  Other: There is a 7.2 x 8.4 x 6.8 cm complex perirectal fluid collection with air within the fluid and surrounding inflammatory changes. The fluid collection extends into the left gluteal subcutaneous fat.  Musculoskeletal: No  aggressive lytic or sclerotic osseous lesion. Small sclerotic focus in the right femoral neck consistent with a bone island. No acute osseous abnormality.  IMPRESSION: 1. Large perirectal abscess measuring 7.2 x 8.4 x 6.8 cm extending into the left gluteal subcutaneous fat.   Electronically Signed   By: HKathreen Devoid  On: 05/14/2015 21:55   I have personally reviewed and evaluated these images and lab results as part of my medical decision-making.   EKG Interpretation None      MDM   Final diagnoses:  Perirectal abscess    HEENT here for wound recheck, worsening symptoms on antibiotics after I&D. Patient complains of pain in the rectum with flatus. He is unable to defecate. I we will obtain abdominopelvic CT scan with contrast.   The patient's CT returned and shows an extensive and very large perirectal abscess. He has a leukocytosis of 17,000. I discussed the case with Dr. NKathrynn Humblewho has seen the patient. Patient will need surgical intervention. I have spoken with Dr. MLaqueta Carinawho will take the patient to the OR for incision and drainage of perirectal abscess. I discussed this with the patient to agrees with the plan of care.  Multiple doses of the  I personally performed the services described in this documentation, which was scribed in my presence. The recorded information has been reviewed and is accurate.      Margarita Mail, PA-C 05/14/15 2329  Varney Biles, MD 05/16/15 1743

## 2015-05-15 ENCOUNTER — Encounter (HOSPITAL_COMMUNITY): Payer: Self-pay | Admitting: General Practice

## 2015-05-15 ENCOUNTER — Inpatient Hospital Stay (HOSPITAL_COMMUNITY): Payer: BLUE CROSS/BLUE SHIELD | Admitting: Anesthesiology

## 2015-05-15 LAB — CBC
HCT: 40.1 % (ref 39.0–52.0)
Hemoglobin: 13.4 g/dL (ref 13.0–17.0)
MCH: 28.6 pg (ref 26.0–34.0)
MCHC: 33.4 g/dL (ref 30.0–36.0)
MCV: 85.7 fL (ref 78.0–100.0)
Platelets: 303 10*3/uL (ref 150–400)
RBC: 4.68 MIL/uL (ref 4.22–5.81)
RDW: 13.3 % (ref 11.5–15.5)
WBC: 19.4 10*3/uL — ABNORMAL HIGH (ref 4.0–10.5)

## 2015-05-15 LAB — CREATININE, SERUM
Creatinine, Ser: 1.1 mg/dL (ref 0.61–1.24)
GFR calc Af Amer: >60 mL/min (ref >60)
GFR calc non Af Amer: >60 mL/min (ref >60)

## 2015-05-15 MED ORDER — DIPHENHYDRAMINE HCL 25 MG PO CAPS: 25.0000 mg | ORAL_CAPSULE | Freq: Four times a day (QID) | ORAL | Status: DC | PRN

## 2015-05-15 MED ORDER — MIDAZOLAM HCL 2 MG/2ML IJ SOLN: 0.5000 mg | Freq: Once | INTRAMUSCULAR | Status: DC | PRN

## 2015-05-15 MED ORDER — ALBUTEROL SULFATE (2.5 MG/3ML) 0.083% IN NEBU: 3.0000 mL | INHALATION_SOLUTION | RESPIRATORY_TRACT | Status: DC | PRN

## 2015-05-15 MED ORDER — OXYCODONE HCL 5 MG PO TABS
5.0000 mg | ORAL_TABLET | ORAL | Status: DC | PRN
  Administered 2015-05-15 (×3): 15 mg via ORAL
  Filled 2015-05-15 (×3): qty 3

## 2015-05-15 MED ORDER — ROCURONIUM BROMIDE 100 MG/10ML IV SOLN
INTRAVENOUS | Status: DC | PRN
  Administered 2015-05-15: 20 mg via INTRAVENOUS

## 2015-05-15 MED ORDER — SODIUM CHLORIDE 0.9 % IV SOLN
INTRAVENOUS | Status: DC
  Administered 2015-05-15: 06:00:00 via INTRAVENOUS

## 2015-05-15 MED ORDER — NEOSTIGMINE METHYLSULFATE 10 MG/10ML IV SOLN
INTRAVENOUS | Status: AC
  Filled 2015-05-15: qty 1

## 2015-05-15 MED ORDER — OXYCODONE HCL 5 MG PO TABS
5.0000 mg | ORAL_TABLET | ORAL | Status: DC | PRN
  Administered 2015-05-15: 10 mg via ORAL
  Filled 2015-05-15: qty 2

## 2015-05-15 MED ORDER — OXYCODONE HCL 5 MG PO TABS
5.0000 mg | ORAL_TABLET | Freq: Four times a day (QID) | ORAL | Status: DC | PRN
Start: 2015-05-15 — End: 2015-09-09

## 2015-05-15 MED ORDER — DIPHENHYDRAMINE HCL 50 MG/ML IJ SOLN: 25.0000 mg | Freq: Four times a day (QID) | INTRAMUSCULAR | Status: DC | PRN

## 2015-05-15 MED ORDER — ONDANSETRON HCL 4 MG/2ML IJ SOLN
INTRAMUSCULAR | Status: DC | PRN
  Administered 2015-05-15: 4 mg via INTRAVENOUS

## 2015-05-15 MED ORDER — NICOTINE 14 MG/24HR TD PT24
14.0000 mg | MEDICATED_PATCH | Freq: Every day | TRANSDERMAL | Status: DC
  Administered 2015-05-15: 14 mg via TRANSDERMAL
  Filled 2015-05-15: qty 1

## 2015-05-15 MED ORDER — DOCUSATE SODIUM 100 MG PO CAPS
100.0000 mg | ORAL_CAPSULE | Freq: Two times a day (BID) | ORAL | Status: DC
  Administered 2015-05-15: 100 mg via ORAL
  Filled 2015-05-15: qty 1

## 2015-05-15 MED ORDER — DEXTROSE 5 % IV SOLN
1.0000 g | Freq: Three times a day (TID) | INTRAVENOUS | Status: DC
  Administered 2015-05-15 (×2): 1 g via INTRAVENOUS
  Filled 2015-05-15 (×5): qty 1

## 2015-05-15 MED ORDER — ONDANSETRON 4 MG PO TBDP: 4.0000 mg | ORAL_TABLET | Freq: Four times a day (QID) | ORAL | Status: DC | PRN

## 2015-05-15 MED ORDER — LACTATED RINGERS IV SOLN
INTRAVENOUS | Status: DC | PRN
  Administered 2015-05-15 (×2): via INTRAVENOUS

## 2015-05-15 MED ORDER — CIPROFLOXACIN HCL 500 MG PO TABS: 500.0000 mg | ORAL_TABLET | Freq: Two times a day (BID) | ORAL | Status: DC

## 2015-05-15 MED ORDER — ONDANSETRON HCL 4 MG/2ML IJ SOLN: 4.0000 mg | Freq: Four times a day (QID) | INTRAMUSCULAR | Status: DC | PRN

## 2015-05-15 MED ORDER — MEPERIDINE HCL 25 MG/ML IJ SOLN: 6.2500 mg | INTRAMUSCULAR | Status: DC | PRN

## 2015-05-15 MED ORDER — ENOXAPARIN SODIUM 40 MG/0.4ML ~~LOC~~ SOLN: 40.0000 mg | SUBCUTANEOUS | Status: DC

## 2015-05-15 MED ORDER — FLUTICASONE FUROATE-VILANTEROL 100-25 MCG/INH IN AEPB: 2.0000 | INHALATION_SPRAY | Freq: Every day | RESPIRATORY_TRACT | Status: DC

## 2015-05-15 MED ORDER — METRONIDAZOLE 500 MG PO TABS
500.0000 mg | ORAL_TABLET | Freq: Three times a day (TID) | ORAL | Status: DC
Start: 2015-05-15 — End: 2016-11-09

## 2015-05-15 MED ORDER — PROPOFOL 10 MG/ML IV BOLUS
INTRAVENOUS | Status: DC | PRN
  Administered 2015-05-15: 180 mg via INTRAVENOUS

## 2015-05-15 MED ORDER — IBUPROFEN 600 MG PO TABS
600.0000 mg | ORAL_TABLET | Freq: Four times a day (QID) | ORAL | Status: DC | PRN
  Administered 2015-05-15: 600 mg via ORAL
  Filled 2015-05-15: qty 1

## 2015-05-15 MED ORDER — DOCUSATE SODIUM 100 MG PO CAPS: 100.0000 mg | ORAL_CAPSULE | Freq: Two times a day (BID) | ORAL | Status: DC | PRN

## 2015-05-15 MED ORDER — GLYCOPYRROLATE 0.2 MG/ML IJ SOLN
INTRAMUSCULAR | Status: AC
  Filled 2015-05-15: qty 2

## 2015-05-15 MED ORDER — 0.9 % SODIUM CHLORIDE (POUR BTL) OPTIME
TOPICAL | Status: DC | PRN
  Administered 2015-05-15: 1000 mL

## 2015-05-15 MED ORDER — PROMETHAZINE HCL 25 MG/ML IJ SOLN: 6.2500 mg | INTRAMUSCULAR | Status: DC | PRN

## 2015-05-15 MED ORDER — HYDROMORPHONE HCL 1 MG/ML IJ SOLN
1.0000 mg | INTRAMUSCULAR | Status: DC | PRN
  Administered 2015-05-15 (×2): 1 mg via INTRAVENOUS
  Filled 2015-05-15 (×3): qty 1

## 2015-05-15 MED ORDER — ALBUMIN HUMAN 5 % IV SOLN
INTRAVENOUS | Status: DC | PRN
Start: 2015-05-15 — End: 2015-05-15
  Administered 2015-05-15: 01:00:00 via INTRAVENOUS

## 2015-05-15 MED ORDER — IBUPROFEN 600 MG PO TABS: 600.0000 mg | ORAL_TABLET | Freq: Four times a day (QID) | ORAL | Status: DC | PRN

## 2015-05-15 MED ORDER — GLYCOPYRROLATE 0.2 MG/ML IJ SOLN
INTRAMUSCULAR | Status: DC | PRN
  Administered 2015-05-15: 0.4 mg via INTRAVENOUS

## 2015-05-15 MED ORDER — HYDROMORPHONE HCL 1 MG/ML IJ SOLN
INTRAMUSCULAR | Status: AC
  Filled 2015-05-15: qty 1

## 2015-05-15 MED ORDER — SUCCINYLCHOLINE CHLORIDE 20 MG/ML IJ SOLN
INTRAMUSCULAR | Status: DC | PRN
  Administered 2015-05-15: 140 mg via INTRAVENOUS

## 2015-05-15 MED ORDER — MIDAZOLAM HCL 5 MG/5ML IJ SOLN
INTRAMUSCULAR | Status: DC | PRN
  Administered 2015-05-15: 2 mg via INTRAVENOUS

## 2015-05-15 MED ORDER — FENTANYL CITRATE (PF) 100 MCG/2ML IJ SOLN
INTRAMUSCULAR | Status: DC | PRN
  Administered 2015-05-15: 50 ug via INTRAVENOUS
  Administered 2015-05-15 (×2): 100 ug via INTRAVENOUS

## 2015-05-15 MED ORDER — ZOLPIDEM TARTRATE 5 MG PO TABS: 5.0000 mg | ORAL_TABLET | Freq: Every evening | ORAL | Status: DC | PRN

## 2015-05-15 MED ORDER — NICOTINE 14 MG/24HR TD PT24: 14.0000 mg | MEDICATED_PATCH | Freq: Every day | TRANSDERMAL | Status: DC

## 2015-05-15 MED ORDER — HYDROMORPHONE HCL 1 MG/ML IJ SOLN
0.2500 mg | INTRAMUSCULAR | Status: DC | PRN
  Administered 2015-05-15 (×2): 0.5 mg via INTRAVENOUS

## 2015-05-15 MED ORDER — NEOSTIGMINE METHYLSULFATE 10 MG/10ML IV SOLN
INTRAVENOUS | Status: DC | PRN
  Administered 2015-05-15: 4 mg via INTRAVENOUS

## 2015-05-15 NOTE — Transfer of Care (Signed)
Immediate Anesthesia Transfer of Care Note  Patient: Jay Gentry  Procedure(s) Performed: Procedure(s): DRAINAGE PERIRECTAL ABSCESS (N/A)  Patient Location: PACU  Anesthesia Type:General  Level of Consciousness: awake  Airway & Oxygen Therapy: Patient Spontanous Breathing and Patient connected to face mask oxygen  Post-op Assessment: Report given to RN and Post -op Vital signs reviewed and stable  Post vital signs: Reviewed and stable  Last Vitals:  Filed Vitals:   05/14/15 2224  BP: 102/70  Pulse: 86  Temp:   Resp: 18    Complications: No apparent anesthesia complications

## 2015-05-15 NOTE — Anesthesia Procedure Notes (Signed)
Procedure Name: Intubation Date/Time: 05/15/2015 12:17 AM Performed by: Anureet Bruington S Pre-anesthesia Checklist: Patient identified, Timeout performed, Emergency Drugs available, Suction available and Patient being monitored Patient Re-evaluated:Patient Re-evaluated prior to inductionOxygen Delivery Method: Circle system utilized Preoxygenation: Pre-oxygenation with 100% oxygen Intubation Type: IV induction, Rapid sequence and Cricoid Pressure applied Ventilation: Mask ventilation without difficulty Laryngoscope Size: Mac and 4 Grade View: Grade I Tube type: Oral Tube size: 8.0 mm Number of attempts: 1 Airway Equipment and Method: Stylet Placement Confirmation: ETT inserted through vocal cords under direct vision,  positive ETCO2 and breath sounds checked- equal and bilateral Secured at: 23 cm Tube secured with: Tape Dental Injury: Teeth and Oropharynx as per pre-operative assessment

## 2015-05-15 NOTE — Discharge Instructions (Signed)
Peri-Rectal Abscess Your caregiver has diagnosed you as having a peri-rectal abscess. This is an infected area near the rectum that is filled with pus. If the abscess is near the surface of the skin, your caregiver may open (incise) the area and drain the pus. HOME CARE INSTRUCTIONS  1. If your abscess was opened up and drained. A small piece of gauze may be placed in the opening so that it can drain. Do not remove the gauze unless directed by your caregiver. 2. A loose dressing may be placed over the abscess site. Change the dressing as often as necessary to keep it clean and dry. 3. After the drain is removed, the area may be washed with a gentle antiseptic (soap) four times per day. 4. A warm sitz bath, warm packs or heating pad may be used for pain relief, taking care not to burn yourself. 5. Return for a wound check in 1 day or as directed. 6. An "inflatable doughnut" may be used for sitting with added comfort. These can be purchased at a drugstore or medical supply house. 7. To reduce pain and straining with bowel movements, eat a high fiber diet with plenty of fruits and vegetables. Use stool softeners as recommended by your caregiver. This is especially important if narcotic type pain medications were prescribed as these may cause marked constipation. 8. Only take over-the-counter or prescription medicines for pain, discomfort, or fever as directed by your caregiver. SEEK IMMEDIATE MEDICAL CARE IF:   You have increasing pain that is not controlled by medication.  There is increased inflammation (redness), swelling, bleeding, or drainage from the area.  An oral temperature above 102 F (38.9 C) develops.  You develop chills or generalized malaise (feel lethargic or feel "washed out").  You develop any new symptoms (problems) you feel may be related to your present problem. Document Released: 08/27/2000 Document Revised: 11/22/2011 Document Reviewed: 08/27/2008 [facility] Patient  Information 2015 [place], [place]. This information is not intended to replace advice given to you by your health care provider. Make sure you discuss any questions you have with your health care provider.   Sitz Bath A sitz bath is a warm water bath taken in the sitting position that covers only the hips and buttocks. It may be used for either healing or hygiene purposes after bowel movements. Sitz baths are also used to relieve pain, itching, or muscle spasms. The water may contain medicine. Moist heat will help you heal and relax.  HOME CARE INSTRUCTIONS  Take 3 to 4 sitz baths a day. -ill the bathtub half full with warm water and sprinkle in epsom salts. -Sit in the water and open the drain a little. -Turn on the warm water to keep the tub half full. Keep the water running constantly. -Soak in the water for 15 to 20 minutes. -After the sitz bath, pat the affected area dry first. SEEK MEDICAL CARE IF:  You get worse instead of better. Stop the sitz baths if you get worse. MAKE SURE YOU:  Understand these instructions.  Will watch your condition.  Will get help right away if you are not doing well or get worse. Document Released: 05/22/2004 Document Revised: 05/24/2012 Document Reviewed: 11/27/2010 [facility] Patient Information 2015 [place], [place]. This information is not intended to replace advice given to you by your health care provider. Make sure you discuss any questions you have with your health care provider.

## 2015-05-15 NOTE — Anesthesia Postprocedure Evaluation (Signed)
  Anesthesia Post-op Note  Patient: Chartered certified accountant  Procedure(s) Performed: Procedure(s): DRAINAGE PERIRECTAL ABSCESS (N/A)  Patient Location: PACU  Anesthesia Type:General  Level of Consciousness: awake, alert , oriented and patient cooperative  Airway and Oxygen Therapy: Patient Spontanous Breathing and Patient connected to nasal cannula oxygen  Post-op Pain: mild  Post-op Assessment: Post-op Vital signs reviewed, Patient's Cardiovascular Status Stable, Respiratory Function Stable, Patent Airway, No signs of Nausea or vomiting and Pain level controlled              Post-op Vital Signs: Reviewed and stable  Last Vitals:  Filed Vitals:   05/15/15 0449  BP: 100/54  Pulse: 61  Temp: 36.8 C  Resp: 19    Complications: No apparent anesthesia complications

## 2015-05-15 NOTE — Progress Notes (Signed)
[facility] Progress Note  1 Day Post-Op  Subjective: Pt in a lot of pain.  Hungry/thirsty, but c/o throat pain, fatigue.  Doesn't know quite how to move.  Wife at bedside.    Objective: Vital signs in last 24 hours: Temp:  [98.2 F (36.8 C)-99.5 F (37.5 C)] 98.2 F (36.8 C) (09/01 0449) Pulse Rate:  [61-96] 61 (09/01 0449) Resp:  [13-20] 19 (09/01 0449) BP: (100-127)/(54-81) 100/54 mmHg (09/01 0449) SpO2:  [92 %-99 %] 98 % (09/01 0449) Weight:  [81.647 kg (180 lb)] 81.647 kg (180 lb) (08/31 1823) Last BM Date: 05/14/15  Intake/Output from previous day: 08/31 0701 - 09/01 0700 In: 1490 [P.O.:240; I.V.:1000; IV Piggyback:250] Out: 410 [Urine:400; Blood:10] Intake/Output this shift:    PE: Gen:  Alert, NAD, pleasant Buttock:   2cm wound with penrose drain sutured in place, sanguinous purulent drainage, mild foul smell.  No significant erythema and no fluctuance.  Lab Results:   Recent Labs  05/14/15 1921 05/14/15 2017 05/15/15 0322  WBC 17.1*  --  19.4*  HGB 15.3 17.0 13.4  HCT 44.6 50.0 40.1  PLT 309  --  303   BMET  Recent Labs  05/14/15 2017 05/15/15 0322  NA 137  --   K 4.2  --   CL 100*  --   GLUCOSE 97  --   BUN 17  --   CREATININE 1.20 1.10   PT/INR No results for input(s): LABPROT, INR in the last 72 hours. CMP     Component Value Date/Time   NA 137 05/14/2015 2017   K 4.2 05/14/2015 2017   CL 100* 05/14/2015 2017   GLUCOSE 97 05/14/2015 2017   BUN 17 05/14/2015 2017   CREATININE 1.10 05/15/2015 0322   GFRNONAA >60 05/15/2015 0322   GFRAA >60 05/15/2015 0322   Lipase  No results found for: LIPASE     Studies/Results: Ct Abdomen Pelvis W Contrast  05/14/2015   CLINICAL DATA:  Gluteal abscess, pain with flatus. Unable to defecate.  EXAM: CT ABDOMEN AND PELVIS WITH CONTRAST  TECHNIQUE: Multidetector CT imaging of the abdomen and pelvis was performed using the standard protocol following bolus administration of intravenous  contrast.  CONTRAST:  OMNIPAQUE IOHEXOL 300 MG/ML  SOLN  COMPARISON:  None.  FINDINGS: Lower chest:  Clear lung bases.  Normal heart size.  Hepatobiliary: 8 mm hypodensity in the right hepatic lobe which is too small to characterize. No other focal hepatic mass. Normal gallbladder.  Pancreas: Normal pancreas.  Spleen: Normal spleen.  Adrenals/Urinary Tract: Normal adrenal glands. Normal right kidney. Multiple small subcentimeter hypodensities in the left kidney which are too small to characterize, but statistically represent small cysts. No obstructive uropathy. Normal bladder.  Stomach/Bowel: No bowel dilatation or bowel wall thickening. Normal caliber appendix without periappendiceal inflammatory changes. No pneumatosis, pneumoperitoneum or portal venous gas.  Vascular/Lymphatic: Normal caliber abdominal aorta with minimal atherosclerosis. No abdominal or pelvic lymphadenopathy.  Other: There is a 7.2 x 8.4 x 6.8 cm complex perirectal fluid collection with air within the fluid and surrounding inflammatory changes. The fluid collection extends into the left gluteal subcutaneous fat.  Musculoskeletal: No aggressive lytic or sclerotic osseous lesion. Small sclerotic focus in the right femoral neck consistent with a bone island. No acute osseous abnormality.  IMPRESSION: 1. Large perirectal abscess measuring 7.2 x 8.4 x 6.8 cm extending into the left gluteal subcutaneous fat.   Electronically Signed   By: Elige Ko   On: 05/14/2015 21:55  Anti-infectives: Anti-infectives    Start     Dose/Rate Route Frequency Ordered Stop   05/15/15 0215  cefOXitin (MEFOXIN) 1 g in dextrose 5 % 50 mL IVPB     1 g 100 mL/hr over 30 Minutes Intravenous 3 times per day 05/15/15 0211         Assessment/Plan POD #1 s/p Incision and drainage of left perirectal abscess -Lightly packed 4x4 gauze WD dressing changes BID -Wife willing to learn how to change dressing -Ambulate and IS -SCD's and lovenox -Shower with  wound open then repack -Wean to orals  Disp - d/c home when tolerating pain with dressing changes with only orals, ?this afternoon     LOS: 1 day    [name] 05/15/2015, 8:10 AM Pager: [phone]

## 2015-05-15 NOTE — Op Note (Signed)
Pre-op Diagnosis:  Left perirectal abscess Post-op Diagnosis:  Same Procedure:  Incision and drainage of left perirectal abscess Surgeon:  Manus Rudd K. Anesthesia:  GETT Indications:  39 yo presents with a one-week history of worsening perirectal pain.  He presented to the ED two days ago and underwent a small I&D.  He returned today with worsening pain.  CT scan showed a 7.2 x 8.4 x 6.8 cm perirectal fluid collection.  He presents now for emergent drainage.  Description of procedure:  The patient was brought to the operating room and placed in a supine position on the operating table. After an adequate level general anesthesia was obtained, his legs were placed in yellowfin stirrups in lithotomy position. His perineum was prepped with Betadine and draped sterile fashion. A timeout was taken to ensure the proper patient proper procedure. I made a 1 cm incision over the area of the greatest fluctuance. We dissected down into a very large abscess cavity filled with purulent fluid. The fluid was cultured and sent for microbiology. We suctioned out the abscess fluid. I made a round 3 cm incision over the abscess excising skin and subcutaneous tissue. The abscess cavity extends up the left side of the perirectal region for distance of about 8 cm. Loculations were broken up. Hemostasis was obtained with cautery. We irrigated the wound thoroughly. Rectal examination showed no communication between abscess cavity and the rectal vault or the anal canal. A 1 inch Penrose drain was placed into the wound and sutured to the skin with 2-0 nylon suture. The superficial part of the wound was packed with a saline moistened gauze. A dry dressing was applied. The patient was extubated and brought to the recovery room stable condition. All sponge, instrument, and needle counts are correct.  Wilmon Arms. Corliss Skains, MD, [facility] Surgery  General/ Trauma Surgery  05/15/2015 12:58 AM

## 2015-05-15 NOTE — Discharge Summary (Signed)
[facility] Discharge Summary   Patient ID: Jay Gentry MRN: [id number] DOB/AGE: November 17, 1975 39 y.o.  Admit date: 05/14/2015 Discharge date: 05/15/2015  Admitting Diagnosis: Perirectal abscess  Discharge Diagnosis Patient Active Problem List   Diagnosis Date Noted  . Perirectal abscess 05/14/2015    Consultants None  Imaging: Ct Abdomen Pelvis W Contrast  05/14/2015   CLINICAL DATA:  Gluteal abscess, pain with flatus. Unable to defecate.  EXAM: CT ABDOMEN AND PELVIS WITH CONTRAST  TECHNIQUE: Multidetector CT imaging of the abdomen and pelvis was performed using the standard protocol following bolus administration of intravenous contrast.  CONTRAST:  160m OMNIPAQUE IOHEXOL 300 MG/ML  SOLN  COMPARISON:  None.  FINDINGS: Lower chest:  Clear lung bases.  Normal heart size.  Hepatobiliary: 8 mm hypodensity in the right hepatic lobe which is too small to characterize. No other focal hepatic mass. Normal gallbladder.  Pancreas: Normal pancreas.  Spleen: Normal spleen.  Adrenals/Urinary Tract: Normal adrenal glands. Normal right kidney. Multiple small subcentimeter hypodensities in the left kidney which are too small to characterize, but statistically represent small cysts. No obstructive uropathy. Normal bladder.  Stomach/Bowel: No bowel dilatation or bowel wall thickening. Normal caliber appendix without periappendiceal inflammatory changes. No pneumatosis, pneumoperitoneum or portal venous gas.  Vascular/Lymphatic: Normal caliber abdominal aorta with minimal atherosclerosis. No abdominal or pelvic lymphadenopathy.  Other: There is a 7.2 x 8.4 x 6.8 cm complex perirectal fluid collection with air within the fluid and surrounding inflammatory changes. The fluid collection extends into the left gluteal subcutaneous fat.  Musculoskeletal: No aggressive lytic or sclerotic osseous lesion. Small sclerotic focus in the right femoral neck consistent with a bone island. No acute osseous  abnormality.  IMPRESSION: 1. Large perirectal abscess measuring 7.2 x 8.4 x 6.8 cm extending into the left gluteal subcutaneous fat.   Electronically Signed   By: HKathreen Devoid  On: 05/14/2015 21:55    Procedures Dr. TGeorgette Dover(05/14/15) - Incision and drainage of perirectal abscess with penrose drain placement   Hospital Course:  39y/o male who presents with a one week history of worsening left perirectal pain and swelling. He was seen in the ED two days ago, and an attempt was made to drain a "pilonidal" abscess. However, the pain and swelling have worsened significantly and now the patient is unable to have bowel movements. He returned to the ED and a CT pelvis was obtained.  Workup showed perirectal abscess.  Patient was admitted and underwent procedure listed above.  Tolerated procedure well and was transferred to the floor.  Diet was advanced as tolerated.  Patient is interested in quitting smoking and would like nicotine patch.  This was initiated in hospital.  He is encouraged to follow up with a primary care about his bronchitis/asthma since he is out of his pro-air.  I gave him a prescription for one at discharge to hold him over.  On POD #1, the patient was voiding well, tolerating diet, ambulating well, pain well controlled, vital signs stable, incisions c/d/i and felt stable for discharge home.  Patient will follow up in our office in 2-3 weeks and knows to call with questions or concerns.  He will call to confirm appointment date/time.        Medication List    STOP taking these medications        clindamycin 300 MG capsule  Commonly known as:  CLEOCIN     lidocaine-hydrocortisone 3-1 % Kit  Commonly known as:  ANAMANTLE  oxyCODONE-acetaminophen 5-325 MG per tablet  Commonly known as:  PERCOCET      TAKE these medications        albuterol 108 (90 BASE) MCG/ACT inhaler  Commonly known as:  PROVENTIL HFA;VENTOLIN HFA  Inhale 2 puffs into the lungs every 4 (four) hours  as needed for wheezing.     albuterol (2.5 MG/3ML) 0.083% nebulizer solution  Commonly known as:  PROVENTIL  Take 3 mLs (2.5 mg total) by nebulization every 4 (four) hours as needed for wheezing or shortness of breath.     ciprofloxacin 500 MG tablet  Commonly known as:  CIPRO  Take 1 tablet (500 mg total) by mouth 2 (two) times daily.     docusate sodium 100 MG capsule  Commonly known as:  COLACE  Take 1 capsule (100 mg total) by mouth 2 (two) times daily as needed for mild constipation.     Fluticasone Furoate-Vilanterol 100-25 MCG/INH Aepb  Inhale 2 puffs into the lungs daily.     ibuprofen 600 MG tablet  Commonly known as:  ADVIL,MOTRIN  Take 1 tablet (600 mg total) by mouth every 6 (six) hours as needed for mild pain.     metroNIDAZOLE 500 MG tablet  Commonly known as:  FLAGYL  Take 1 tablet (500 mg total) by mouth 3 (three) times daily.     nicotine 14 mg/24hr patch  Commonly known as:  NICODERM CQ - dosed in mg/24 hours  Place 1 patch (14 mg total) onto the skin daily.     oxyCODONE 5 MG immediate release tablet  Commonly known as:  Oxy IR/ROXICODONE  Take 1-3 tablets (5-15 mg total) by mouth every 6 (six) hours as needed for moderate pain.         Follow-up Information    Follow up with Maia Petties., MD. Daphane Shepherd on 05/26/2015.   Specialty:  General Surgery   Why:  For post-operation check.  Appointment is on 05/26/15 at 9:00 am, please arrive by 8:30am to check in and fill out paperwork.     Contact information:   [address] [place] [id number] [phone]       Signed: Nat Christen, [facility] Surgery [phone]  05/15/2015, 1:45 PM

## 2015-05-15 NOTE — Progress Notes (Signed)
Jay Gentry to be D/C'd Home per MD order.  Discussed with the patient and all questions fully answered.  VSS, Skin clean, dry and intact without evidence of skin break down, no evidence of skin tears noted. IV catheter discontinued intact. Site without signs and symptoms of complications. Dressing and pressure applied.  An After Visit Summary was printed and given to the patient. Patient received prescription.  D/c education completed with patient/family including follow up instructions, medication list, d/c activities limitations if indicated, with other d/c instructions as indicated by MD - patient able to verbalize understanding, all questions fully answered.   Showed wife how to do dressing change.   Patient instructed to return to ED, call 911, or call MD for any changes in condition.   Patient escorted via WC, and D/C home via private auto.  [name] 05/15/2015 1800

## 2015-05-15 NOTE — Anesthesia Preprocedure Evaluation (Addendum)
Anesthesia Evaluation  Patient identified by MRN, date of birth, ID band Patient awake    Reviewed: Allergy & Precautions, NPO status , Patient's Chart, lab work & pertinent test results  History of Anesthesia Complications Negative for: history of anesthetic complications  Airway Mallampati: I  TM Distance: >3 FB Neck ROM: Full    Dental  (+) Dental Advisory Given, Chipped   Pulmonary Current Smoker,  breath sounds clear to auscultation        Cardiovascular - anginanegative cardio ROS  Rhythm:Regular Rate:Normal     Neuro/Psych negative neurological ROS     GI/Hepatic negative GI ROS, (+)     substance abuse  alcohol use and marijuana use,   Endo/Other  negative endocrine ROS  Renal/GU negative Renal ROS     Musculoskeletal negative musculoskeletal ROS (+)   Abdominal   Peds  Hematology negative hematology ROS (+)   Anesthesia Other Findings   Reproductive/Obstetrics                          Anesthesia Physical Anesthesia Plan  ASA: II and emergent  Anesthesia Plan: General   Post-op Pain Management:    Induction: Intravenous  Airway Management Planned: Oral ETT  Additional Equipment:   Intra-op Plan:   Post-operative Plan: Extubation in OR  Informed Consent: I have reviewed the patients History and Physical, chart, labs and discussed the procedure including the risks, benefits and alternatives for the proposed anesthesia with the patient or authorized representative who has indicated his/her understanding and acceptance.   Dental advisory given  Plan Discussed with: CRNA, Anesthesiologist and Surgeon  Anesthesia Plan Comments: (Plan routine monitors, GETA)       Anesthesia Quick Evaluation

## 2015-05-17 LAB — CULTURE, ROUTINE-ABSCESS

## 2015-05-19 LAB — TISSUE CULTURE

## 2015-05-21 LAB — ANAEROBIC CULTURE

## 2015-06-30 LAB — AFB CULTURE WITH SMEAR (NOT AT ARMC): Acid Fast Smear: NONE SEEN

## 2015-07-03 LAB — AFB CULTURE WITH SMEAR (NOT AT ARMC): Acid Fast Smear: NONE SEEN

## 2015-09-09 ENCOUNTER — Emergency Department (INDEPENDENT_AMBULATORY_CARE_PROVIDER_SITE_OTHER)
Admission: EM | Admit: 2015-09-09 | Discharge: 2015-09-09 | Disposition: A | Discharge status: Home / Self Care | Payer: Self-pay | Source: Home / Self Care | Attending: Emergency Medicine | Admitting: Emergency Medicine

## 2015-09-09 ENCOUNTER — Encounter (HOSPITAL_COMMUNITY): Payer: Self-pay | Admitting: Emergency Medicine

## 2015-09-09 DIAGNOSIS — T148XXA Other injury of unspecified body region, initial encounter: Secondary | ICD-10-CM

## 2015-09-09 DIAGNOSIS — S46812A Strain of other muscles, fascia and tendons at shoulder and upper arm level, left arm, initial encounter: Secondary | ICD-10-CM

## 2015-09-09 DIAGNOSIS — T148 Other injury of unspecified body region: Secondary | ICD-10-CM

## 2015-09-09 DIAGNOSIS — S29012A Strain of muscle and tendon of back wall of thorax, initial encounter: Secondary | ICD-10-CM

## 2015-09-09 MED ORDER — NAPROXEN 375 MG PO TABS: 375.0000 mg | ORAL_TABLET | Freq: Two times a day (BID) | ORAL | Status: DC

## 2015-09-09 MED ORDER — DICLOFENAC SODIUM 1 % TD GEL: 1.0000 "application " | Freq: Four times a day (QID) | TRANSDERMAL | Status: DC
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
## 2016-09-14 ENCOUNTER — Emergency Department (HOSPITAL_COMMUNITY)
Admission: EM | Admit: 2016-09-14 | Discharge: 2016-09-14 | Disposition: A | Discharge status: Home / Self Care | Payer: No Typology Code available for payment source | Attending: Emergency Medicine | Admitting: Emergency Medicine

## 2016-09-14 ENCOUNTER — Encounter (HOSPITAL_COMMUNITY): Payer: Self-pay | Admitting: Emergency Medicine

## 2016-09-14 DIAGNOSIS — F17210 Nicotine dependence, cigarettes, uncomplicated: Secondary | ICD-10-CM | POA: Diagnosis not present

## 2016-09-14 DIAGNOSIS — R300 Dysuria: Secondary | ICD-10-CM | POA: Diagnosis not present

## 2016-09-14 DIAGNOSIS — Z79899 Other long term (current) drug therapy: Secondary | ICD-10-CM | POA: Insufficient documentation

## 2016-09-14 DIAGNOSIS — J45909 Unspecified asthma, uncomplicated: Secondary | ICD-10-CM | POA: Insufficient documentation

## 2016-09-14 DIAGNOSIS — R0602 Shortness of breath: Secondary | ICD-10-CM | POA: Diagnosis present

## 2016-09-14 HISTORY — DX: Unspecified asthma, uncomplicated: J45.909

## 2016-09-14 LAB — BASIC METABOLIC PANEL
Anion gap: 14 (ref 5–15)
BUN: 16 mg/dL (ref 6–20)
CO2: 21 mmol/L — ABNORMAL LOW (ref 22–32)
Calcium: 9.8 mg/dL (ref 8.9–10.3)
Chloride: 102 mmol/L (ref 101–111)
Creatinine, Ser: 1.43 mg/dL — ABNORMAL HIGH (ref 0.61–1.24)
GFR calc Af Amer: >60 mL/min (ref >60)
GFR calc non Af Amer: >60 mL/min (ref >60)
Glucose, Bld: 101 mg/dL — ABNORMAL HIGH (ref 65–99)
Potassium: 3.8 mmol/L (ref 3.5–5.1)
Sodium: 137 mmol/L (ref 135–145)

## 2016-09-14 LAB — URINALYSIS, ROUTINE W REFLEX MICROSCOPIC
Bacteria, UA: NONE SEEN
Bilirubin Urine: NEGATIVE
Glucose, UA: NEGATIVE mg/dL
Ketones, ur: 5 mg/dL — AB
Leukocytes, UA: NEGATIVE
Nitrite: NEGATIVE
Protein, ur: NEGATIVE mg/dL
Specific Gravity, Urine: 1.015 (ref 1.005–1.030)
Squamous Epithelial / LPF: NONE SEEN
pH: 5 (ref 5.0–8.0)

## 2016-09-14 LAB — CBC WITH DIFFERENTIAL/PLATELET
Basophils Absolute: 0 10*3/uL (ref 0.0–0.1)
Basophils Relative: 1 %
Eosinophils Absolute: 0.1 10*3/uL (ref 0.0–0.7)
Eosinophils Relative: 1 %
HCT: 48.6 % (ref 39.0–52.0)
Hemoglobin: 17.2 g/dL — ABNORMAL HIGH (ref 13.0–17.0)
Lymphocytes Relative: 32 %
Lymphs Abs: 2.8 10*3/uL (ref 0.7–4.0)
MCH: 29.7 pg (ref 26.0–34.0)
MCHC: 35.4 g/dL (ref 30.0–36.0)
MCV: 83.8 fL (ref 78.0–100.0)
Monocytes Absolute: 0.6 10*3/uL (ref 0.1–1.0)
Monocytes Relative: 6 %
Neutro Abs: 5.1 10*3/uL (ref 1.7–7.7)
Neutrophils Relative %: 60 %
Platelets: 204 10*3/uL (ref 150–400)
RBC: 5.8 MIL/uL (ref 4.22–5.81)
RDW: 13.5 % (ref 11.5–15.5)
WBC: 8.6 10*3/uL (ref 4.0–10.5)

## 2016-09-14 MED ORDER — ALBUTEROL SULFATE HFA 108 (90 BASE) MCG/ACT IN AERS
2.0000 | INHALATION_SPRAY | Freq: Four times a day (QID) | RESPIRATORY_TRACT | Status: DC
  Administered 2016-09-14: 2 via RESPIRATORY_TRACT
  Filled 2016-09-14: qty 6.7

## 2016-09-14 MED ORDER — ALBUTEROL SULFATE HFA 108 (90 BASE) MCG/ACT IN AERS: 1.0000 | INHALATION_SPRAY | Freq: Four times a day (QID) | RESPIRATORY_TRACT | 2 refills | Status: DC | PRN

## 2016-09-14 NOTE — Discharge Instructions (Signed)
Use albuterol inhaler 2 puffs every 6 hours as needed. Make appointment to follow-up with the wellness clinic. They may help you be on file primary care doctor and help take care of primary care needs in the meantime. Urinalysis today without evidence of any significant blood or evidence of infection in the urine. Return as needed for any new or worse symptoms.

## 2016-09-14 NOTE — ED Provider Notes (Signed)
[facility]-EMERGENCY DEPT Provider Note   CSN: [id number] Arrival date & time: 09/14/16  1840     History   Chief Complaint Chief Complaint  Patient presents with  . Dysuria  . Hematuria    HPI Jay Gentry is a 41 y.o. male.   Patient presents here with 2 complaints. Patient the was concerned because he saw some blood in his urine. Was afraid he had infection. He's not concerned about an STD. Patient also has a history of asthma. Been out of his albuterol inhaler has been having some feelings of intermittent wheezing. Does not feel like he's been wheezing now. Patient the no longer has a primary care physician is looking for a new one.      Past Medical History:  Diagnosis Date  . Asthma   . Bronchitis   . Perirectal abscess 05/14/2015    Patient Active Problem List   Diagnosis Date Noted  . Perirectal abscess 05/14/2015    Past Surgical History:  Procedure Laterality Date  . INCISION AND DRAINAGE PERIRECTAL ABSCESS  05/14/2015  . INCISION AND DRAINAGE PERIRECTAL ABSCESS N/A 05/14/2015   Procedure: DRAINAGE PERIRECTAL ABSCESS;  Surgeon: Manus RuddMatthew Tsuei, MD;  Location: [facility] OR;  Service: General;  Laterality: N/A;       Home Medications    Prior to Admission medications   Medication Sig Start Date End Date Taking? Authorizing Provider  albuterol (PROVENTIL HFA;VENTOLIN HFA) 108 (90 BASE) MCG/ACT inhaler Inhale 2 puffs into the lungs every 4 (four) hours as needed for wheezing. 04/28/14   Graylon GoodZachary H Baker, PA-C  albuterol (PROVENTIL HFA;VENTOLIN HFA) 108 (90 Base) MCG/ACT inhaler Inhale 1-2 puffs into the lungs every 6 (six) hours as needed for wheezing or shortness of breath. 09/14/16   Vanetta MuldersScott Kingstyn Deruiter, MD  albuterol (PROVENTIL) (2.5 MG/3ML) 0.083% nebulizer solution Take 3 mLs (2.5 mg total) by nebulization every 4 (four) hours as needed for wheezing or shortness of breath. 04/28/14   Graylon GoodZachary H Baker, PA-C  ciprofloxacin (CIPRO) 500 MG tablet Take 1 tablet (500 mg total)  by mouth 2 (two) times daily. 05/15/15   Nonie HoyerMegan N Baird, PA-C  diclofenac sodium (VOLTAREN) 1 % GEL Apply 1 application topically 4 (four) times daily. 09/09/15   Hayden Rasmussenavid Mabe, NP  docusate sodium (COLACE) 100 MG capsule Take 1 capsule (100 mg total) by mouth 2 (two) times daily as needed for mild constipation. 05/15/15   Nonie HoyerMegan N Baird, PA-C  Fluticasone Furoate-Vilanterol 100-25 MCG/INH AEPB Inhale 2 puffs into the lungs daily. 05/15/15   Nonie HoyerMegan N Baird, PA-C  metroNIDAZOLE (FLAGYL) 500 MG tablet Take 1 tablet (500 mg total) by mouth 3 (three) times daily. 05/15/15   Nonie HoyerMegan N Baird, PA-C  naproxen (NAPROSYN) 375 MG tablet Take 1 tablet (375 mg total) by mouth 2 (two) times daily. 09/09/15   Hayden Rasmussenavid Mabe, NP  nicotine (NICODERM CQ - DOSED IN MG/24 HOURS) 14 mg/24hr patch Place 1 patch (14 mg total) onto the skin daily. 05/15/15   Nonie HoyerMegan N Baird, PA-C    Family History No family history on file.  Social History Social History  Substance Use Topics  . Smoking status: Current Every Day Smoker    Packs/day: 0.50    Years: 18.00    Types: Cigarettes  . Smokeless tobacco: Never Used  . Alcohol use Yes     Comment: occassional     Allergies   Penicillins   Review of Systems Review of Systems  Constitutional: Negative for fever.  HENT: Negative for congestion.  Respiratory: Positive for shortness of breath and wheezing.   Cardiovascular: Positive for chest pain.  Gastrointestinal: Negative for abdominal pain.  Genitourinary: Positive for dysuria and hematuria. Negative for difficulty urinating, discharge, penile pain and testicular pain.  Musculoskeletal: Negative for back pain.  Skin: Negative for rash.  Neurological: Negative for headaches.  Hematological: Does not bruise/bleed easily.  Psychiatric/Behavioral: Negative for confusion.     Physical Exam Updated Vital Signs BP 150/96 (BP Location: Left Arm)   Pulse 90   Temp 99.2 F (37.3 C) (Oral)   Resp 16   Ht 5\' 8"  (1.727 m)   Wt 87.5  kg   SpO2 100%   BMI 29.35 kg/m   Physical Exam  Constitutional: He is oriented to person, place, and time. He appears well-developed and well-nourished.  HENT:  Head: Normocephalic and atraumatic.  Mouth/Throat: Oropharynx is clear and moist.  Eyes: EOM are normal. Pupils are equal, round, and reactive to light.  Neck: Normal range of motion. Neck supple.  Cardiovascular: Normal rate, regular rhythm and normal heart sounds.   Pulmonary/Chest: Effort normal and breath sounds normal. He has no wheezes. He exhibits no tenderness.  Abdominal: Soft. Bowel sounds are normal. There is no tenderness.  Genitourinary: Penis normal.  Musculoskeletal: Normal range of motion.  Neurological: He is alert and oriented to person, place, and time.  Skin: Skin is warm.  Nursing note and vitals reviewed.    ED Treatments / Results  Labs (all labs ordered are listed, but only abnormal results are displayed) Labs Reviewed  URINALYSIS, ROUTINE W REFLEX MICROSCOPIC - Abnormal; Notable for the following:       Result Value   Hgb urine dipstick SMALL (*)    Ketones, ur 5 (*)    All other components within normal limits  CBC WITH DIFFERENTIAL/PLATELET - Abnormal; Notable for the following:    Hemoglobin 17.2 (*)    All other components within normal limits  BASIC METABOLIC PANEL - Abnormal; Notable for the following:    CO2 21 (*)    Glucose, Bld 101 (*)    Creatinine, Ser 1.43 (*)    All other components within normal limits    EKG  EKG Interpretation None       Radiology No results found.  Procedures Procedures (including critical care time)  Medications Ordered in ED Medications  albuterol (PROVENTIL HFA;VENTOLIN HFA) 108 (90 Base) MCG/ACT inhaler 2 puff (not administered)     Initial Impression / Assessment and Plan / ED Course  I have reviewed the triage vital signs and the nursing notes.  Pertinent labs & imaging results that were available during my care of the patient  were reviewed by me and considered in my medical decision making (see chart for details).  Clinical Course     The patient out of his asthma medicine needs an albuterol inhaler to go and will be given a prescription for additional albuterol inhalers. And referral to wellness clinic to try to get him some primary care follow-up. Patient is urinalysis here today without evidence of any hematuria or urinary tract infection. Patient still without any significant abnormalities on exam. Patient is not concerned about having an STD.  Final Clinical Impressions(s) / ED Diagnoses   Final diagnoses:  Uncomplicated asthma, unspecified asthma severity, unspecified whether persistent  Dysuria    New Prescriptions New Prescriptions   ALBUTEROL (PROVENTIL HFA;VENTOLIN HFA) 108 (90 BASE) MCG/ACT INHALER    Inhale 1-2 puffs into the lungs every 6 (six) hours  as needed for wheezing or shortness of breath.     Vanetta Mulders, MD 09/14/16 [phone]

## 2016-09-14 NOTE — ED Triage Notes (Signed)
Patient reports dysuria with hematuria /dark concentrated urine onset today , denies fever or chills .

## 2016-09-14 NOTE — ED Notes (Signed)
Pt stable, ambulatory, states understanding of discharge instructions 

## 2016-11-06 ENCOUNTER — Emergency Department (HOSPITAL_COMMUNITY)
Admission: EM | Admit: 2016-11-06 | Discharge: 2016-11-06 | Disposition: A | Discharge status: Home / Self Care | Payer: No Typology Code available for payment source | Attending: Emergency Medicine | Admitting: Emergency Medicine

## 2016-11-06 ENCOUNTER — Emergency Department (HOSPITAL_COMMUNITY): Payer: No Typology Code available for payment source

## 2016-11-06 ENCOUNTER — Encounter (HOSPITAL_COMMUNITY): Payer: Self-pay | Admitting: Neurology

## 2016-11-06 DIAGNOSIS — Z23 Encounter for immunization: Secondary | ICD-10-CM | POA: Insufficient documentation

## 2016-11-06 DIAGNOSIS — J45909 Unspecified asthma, uncomplicated: Secondary | ICD-10-CM | POA: Diagnosis not present

## 2016-11-06 DIAGNOSIS — L0231 Cutaneous abscess of buttock: Secondary | ICD-10-CM

## 2016-11-06 DIAGNOSIS — F17210 Nicotine dependence, cigarettes, uncomplicated: Secondary | ICD-10-CM | POA: Diagnosis not present

## 2016-11-06 LAB — BASIC METABOLIC PANEL
Anion gap: 12 (ref 5–15)
BUN: 14 mg/dL (ref 6–20)
CO2: 22 mmol/L (ref 22–32)
Calcium: 9.1 mg/dL (ref 8.9–10.3)
Chloride: 106 mmol/L (ref 101–111)
Creatinine, Ser: 1.17 mg/dL (ref 0.61–1.24)
GFR calc Af Amer: >60 mL/min (ref >60)
GFR calc non Af Amer: >60 mL/min (ref >60)
Glucose, Bld: 108 mg/dL — ABNORMAL HIGH (ref 65–99)
Potassium: 4.1 mmol/L (ref 3.5–5.1)
Sodium: 140 mmol/L (ref 135–145)

## 2016-11-06 LAB — CBC WITH DIFFERENTIAL/PLATELET
Basophils Absolute: 0 10*3/uL (ref 0.0–0.1)
Basophils Relative: 0 %
Eosinophils Absolute: 0.1 10*3/uL (ref 0.0–0.7)
Eosinophils Relative: 2 %
HCT: 46.1 % (ref 39.0–52.0)
Hemoglobin: 15.7 g/dL (ref 13.0–17.0)
Lymphocytes Relative: 29 %
Lymphs Abs: 2.3 10*3/uL (ref 0.7–4.0)
MCH: 29 pg (ref 26.0–34.0)
MCHC: 34.1 g/dL (ref 30.0–36.0)
MCV: 85.1 fL (ref 78.0–100.0)
Monocytes Absolute: 0.6 10*3/uL (ref 0.1–1.0)
Monocytes Relative: 7 %
Neutro Abs: 5.1 10*3/uL (ref 1.7–7.7)
Neutrophils Relative %: 62 %
Platelets: 240 10*3/uL (ref 150–400)
RBC: 5.42 MIL/uL (ref 4.22–5.81)
RDW: 14.1 % (ref 11.5–15.5)
WBC: 8.2 10*3/uL (ref 4.0–10.5)

## 2016-11-06 MED ORDER — HYDROCODONE-ACETAMINOPHEN 5-325 MG PO TABS: 2.0000 | ORAL_TABLET | Freq: Four times a day (QID) | ORAL | 0 refills | Status: DC | PRN

## 2016-11-06 MED ORDER — MORPHINE SULFATE (PF) 4 MG/ML IV SOLN
4.0000 mg | Freq: Once | INTRAVENOUS | Status: AC
  Administered 2016-11-06: 4 mg via INTRAVENOUS
  Filled 2016-11-06: qty 1

## 2016-11-06 MED ORDER — CLINDAMYCIN HCL 150 MG PO CAPS: 150.0000 mg | ORAL_CAPSULE | Freq: Four times a day (QID) | ORAL | 0 refills | Status: DC

## 2016-11-06 MED ORDER — CLINDAMYCIN PHOSPHATE 600 MG/50ML IV SOLN
600.0000 mg | Freq: Once | INTRAVENOUS | Status: AC
  Administered 2016-11-06: 600 mg via INTRAVENOUS
  Filled 2016-11-06: qty 50

## 2016-11-06 MED ORDER — TETANUS-DIPHTH-ACELL PERTUSSIS 5-2.5-18.5 LF-MCG/0.5 IM SUSP
0.5000 mL | Freq: Once | INTRAMUSCULAR | Status: AC
  Administered 2016-11-06: 0.5 mL via INTRAMUSCULAR
  Filled 2016-11-06: qty 0.5

## 2016-11-06 MED ORDER — IOPAMIDOL (ISOVUE-300) INJECTION 61%
INTRAVENOUS | Status: AC
  Administered 2016-11-06: 100 mL
  Filled 2016-11-06: qty 100

## 2016-11-06 NOTE — Discharge Instructions (Signed)
You have an early developing infection in your bottom that can potentially form an abscess.  Take antibiotic as prescribed.  Apply warm moist compress to affected area several times daily.  [facility].  Return if you have any concerns.

## 2016-11-06 NOTE — ED Provider Notes (Signed)
[facility]PT Provider Note   CSN: [id number] Arrival date & time: 11/06/16  [id number]   By signing my name below, I, Clarisse GougeXavier Herndon, attest that this documentation has been prepared under the direction and in the presence of Fayrene HelperBowie Paisely Brick, PA-C . Electronically Signed: Clarisse GougeXavier Herndon, Scribe. 11/06/16. 9:57 AM.   History   Chief Complaint Chief Complaint  Patient presents with  . Abscess   The history is provided by the patient and medical records. No language interpreter was used.    HPI Comments: Jay Gentry is a 41 y.o. male with Hx of perirectal abscesses who presents to the Emergency Department complaining of a gradually worsening raised bump to the left buttock x 3 days. He notes associated 10/10 sharp pain to the affected area exacerbated with walking and moving bowels. Per records, pt had surgery for recurrent abscess to the aforementioned region in 2016, performed by Manus RuddMatthew Tsuei, MD. He adds he has treated the area with witch hazel and aleve without relief. He adds he has treated the area with warm water with mild relief to discomfort but no relief to swelling. Pt denies drainage, dysuria, fever or Hx of DM. Last tetanus unknown.  Past Medical History:  Diagnosis Date  . Asthma   . Bronchitis   . Perirectal abscess 05/14/2015    Patient Active Problem List   Diagnosis Date Noted  . Perirectal abscess 05/14/2015    Past Surgical History:  Procedure Laterality Date  . INCISION AND DRAINAGE PERIRECTAL ABSCESS  05/14/2015  . INCISION AND DRAINAGE PERIRECTAL ABSCESS N/A 05/14/2015   Procedure: DRAINAGE PERIRECTAL ABSCESS;  Surgeon: Manus RuddMatthew Tsuei, MD;  Location: [facility] OR;  Service: General;  Laterality: N/A;       Home Medications    Prior to Admission medications   Medication Sig Start Date End Date Taking? Authorizing Provider  albuterol (PROVENTIL HFA;VENTOLIN HFA) 108 (90 BASE) MCG/ACT inhaler Inhale 2 puffs into the lungs every 4 (four) hours as needed for  wheezing. Patient taking differently: Inhale 1-2 puffs into the lungs every 4 (four) hours as needed for wheezing.  04/28/14  Yes Adrian BlackwaterZachary H Baker, PA-C  Fluticasone Furoate-Vilanterol 100-25 MCG/INH AEPB Inhale 2 puffs into the lungs daily. Patient taking differently: Inhale 1 puff into the lungs daily.  05/15/15  Yes Nonie HoyerMegan N Baird, PA-C  naproxen sodium (ANAPROX) 220 MG tablet Take 440 mg by mouth 2 (two) times daily as needed (for pain/headache).   Yes Historical Provider, MD  albuterol (PROVENTIL HFA;VENTOLIN HFA) 108 (90 Base) MCG/ACT inhaler Inhale 1-2 puffs into the lungs every 6 (six) hours as needed for wheezing or shortness of breath. Patient not taking: Reported on 11/06/2016 09/14/16   Vanetta MuldersScott Zackowski, MD  albuterol (PROVENTIL) (2.5 MG/3ML) 0.083% nebulizer solution Take 3 mLs (2.5 mg total) by nebulization every 4 (four) hours as needed for wheezing or shortness of breath. 04/28/14   Graylon GoodZachary H Baker, PA-C  ciprofloxacin (CIPRO) 500 MG tablet Take 1 tablet (500 mg total) by mouth 2 (two) times daily. Patient not taking: Reported on 11/06/2016 05/15/15   Nonie HoyerMegan N Baird, PA-C  diclofenac sodium (VOLTAREN) 1 % GEL Apply 1 application topically 4 (four) times daily. Patient not taking: Reported on 11/06/2016 09/09/15   Hayden Rasmussenavid Mabe, NP  docusate sodium (COLACE) 100 MG capsule Take 1 capsule (100 mg total) by mouth 2 (two) times daily as needed for mild constipation. Patient not taking: Reported on 11/06/2016 05/15/15   Nonie HoyerMegan N Baird, PA-C  metroNIDAZOLE (FLAGYL) 500 MG tablet Take 1  tablet (500 mg total) by mouth 3 (three) times daily. Patient not taking: Reported on 11/06/2016 05/15/15   [name], PA-C  naproxen (NAPROSYN) 375 MG tablet Take 1 tablet (375 mg total) by mouth 2 (two) times daily. Patient not taking: Reported on 11/06/2016 09/09/15   Hayden Rasmussen, NP  nicotine (NICODERM CQ - DOSED IN MG/24 HOURS) 14 mg/24hr patch Place 1 patch (14 mg total) onto the skin daily. Patient not taking: Reported on  11/06/2016 05/15/15   [name], PA-C    Family History No family history on file.  Social History Social History  Substance Use Topics  . Smoking status: Current Every Day Smoker    Packs/day: 0.50    Years: 18.00    Types: Cigarettes  . Smokeless tobacco: Never Used  . Alcohol use Yes     Comment: occassional     Allergies   Penicillins   Review of Systems Review of Systems  Constitutional: Negative for fever.  Genitourinary: Negative for difficulty urinating and dysuria.  Skin: Negative for color change, rash and wound.       +raised bump  All other systems reviewed and are negative.  A complete 10 system review of systems was obtained and all systems are negative except as noted in the HPI and PMH.    Physical Exam Updated Vital Signs BP 134/82 (BP Location: Right Arm)   Pulse 87   Temp 98.9 F (37.2 C) (Oral)   Resp 20   SpO2 99%   Physical Exam  Constitutional: He is oriented to person, place, and time. He appears well-developed and well-nourished.  HENT:  Head: Normocephalic and atraumatic.  Eyes: EOM are normal. Pupils are equal, round, and reactive to light.  Neck: Normal range of motion. Neck supple. No JVD present.  Cardiovascular: Normal rate, regular rhythm and normal heart sounds.  Exam reveals no gallop and no friction rub.   No murmur heard. Pulmonary/Chest: Effort normal and breath sounds normal. No respiratory distress. He has no wheezes.  Abdominal: He exhibits no distension. There is no rebound and no guarding.  Musculoskeletal: Normal range of motion.  Neurological: He is alert and oriented to person, place, and time.  Skin: No rash noted. No pallor.  Left buttock with an area of induration noted to the medial gluteal cleft adjacent to the rectum with exquisite TTP; no erythema; no obvious rectal involvement.  Psychiatric: He has a normal mood and affect. His behavior is normal.  Nursing note and vitals reviewed.    ED Treatments /  Results  DIAGNOSTIC STUDIES: Oxygen Saturation is 99% on RA, normal by my interpretation.    COORDINATION OF CARE: 9:49 AM Discussed treatment plan with pt at bedside and pt agreed to plan. Pt declines I&D treatment. Will order medications for pain and consult attending physician. 9:51 AM Dr. Rush Landmark consulted. Advised to scan pt and contact surgery.  Labs (all labs ordered are listed, but only abnormal results are displayed) Labs Reviewed  BASIC METABOLIC PANEL - Abnormal; Notable for the following:       Result Value   Glucose, Bld 108 (*)    All other components within normal limits  CBC WITH DIFFERENTIAL/PLATELET    EKG  EKG Interpretation None       Radiology Ct Pelvis W Contrast  Result Date: 11/06/2016 CLINICAL DATA:  41 year old male with buttock abscess for the past 3 days EXAM: CT PELVIS WITH CONTRAST TECHNIQUE: Multidetector CT imaging of the pelvis was performed using  the standard protocol following the bolus administration of intravenous contrast. CONTRAST:  ISOVUE-300 IOPAMIDOL (ISOVUE-300) INJECTION 61% COMPARISON:  Prior CT abdomen/ pelvis 05/14/2015 FINDINGS: Urinary Tract:  No abnormality visualized. Bowel:  Unremarkable visualized pelvic bowel loops. Vascular/Lymphatic: No pathologically enlarged lymph nodes. No significant vascular abnormality seen. Reproductive:  No mass or other significant abnormality Other: Mild asymmetric soft tissue thickening at the left posterolateral margin of the rectal anal junction (5 o'clock position). The soft tissue thickening extends in a bandlike configuration through the subcutaneous fat of the left gluteal muscle to connect to the skin surface. No definite focal fluid collection. The location of the abnormality is very similar compared to the large rectal abscess which was noted on 05/14/2015. Musculoskeletal: No suspicious bone lesions identified. IMPRESSION: 1. Asymmetric soft tissue extending from the left posterolateral  margin of the anal rectal junction tracking posteriorly and superiorly through the subcutaneous fat of the left buttock to communicate with the skin surface at the most inferior aspect of the left buttock. 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 2. No definite focal fluid collection to suggest a drainable perirectal abscess at this time. Electronically Signed   By: Malachy Moan M.D.   On: 11/06/2016 11:10    Procedures Procedures (including critical care time)  Medications Ordered in ED Medications  morphine 4 MG/ML injection 4 mg (not administered)  Tdap (BOOSTRIX) injection 0.5 mL (not administered)     Initial Impression / Assessment and Plan / ED Course  I have reviewed the triage vital signs and the nursing notes.  Pertinent labs & imaging results that were available during my care of the patient were reviewed by me and considered in my medical decision making (see chart for details).     I personally performed the services described in this documentation, which was scribed in my presence. The recorded information has been reviewed and is accurate.   BP 133/94   Pulse 75   Temp 98.9 F (37.2 C) (Oral)   Resp 20   SpO2 98%    Final Clinical Impressions(s) / ED Diagnoses   Final diagnoses:  Abscess of buttock, left    New Prescriptions New Prescriptions   CLINDAMYCIN (CLEOCIN) 150 MG CAPSULE    Take 1 capsule (150 mg total) by mouth every 6 (six) hours.   HYDROCODONE-ACETAMINOPHEN (NORCO/VICODIN) 5-325 MG TABLET    Take 2 tablets by mouth every 6 (six) hours as needed for moderate pain.    12:05 PM Patient has history of large perirectal abscess requiring surgical incision and drainage in the OR in 2016 is here with similar complaint. He is complaining of pain near his rectum at the site of previous perirectal abscess. He is well-appearing, no surrounding  skin erythema and no signs of Fournier gangrene on my initial examination. No obvious rectal involvement. His labs are reassuring. 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 Patient will be given IV antibiotic and pain medication while he is in the ER, will consult surgery for close follow-up. Care discussed with Dr. Rush Landmark.   12:34 PM Appreciate consultation from General Surgery Dr. Magnus Ivan who made aware of CT scan finding.  He recommend abx and outpt f/u with surgery for further care.    Fayrene Helper, PA-C 11/06/16 1329    Cristal Deer  Cleatis Polka, MD 11/06/16 [id number]

## 2016-11-06 NOTE — ED Notes (Signed)
Patient transported to CT 

## 2016-11-06 NOTE — ED Triage Notes (Signed)
Pt here with abscess to buttocks, midline x 3 days. Has hx of same with need for surgery last year. Is very painful and sore. No draining.

## 2016-11-08 ENCOUNTER — Observation Stay (HOSPITAL_COMMUNITY)
Admission: AD | Admit: 2016-11-08 | Discharge: 2016-11-09 | Disposition: A | Discharge status: Home / Self Care | Payer: No Typology Code available for payment source | Source: Ambulatory Visit | Attending: General Surgery | Admitting: General Surgery

## 2016-11-08 ENCOUNTER — Encounter (HOSPITAL_COMMUNITY): Payer: Self-pay

## 2016-11-08 ENCOUNTER — Ambulatory Visit: Payer: Self-pay | Admitting: General Surgery

## 2016-11-08 DIAGNOSIS — F17200 Nicotine dependence, unspecified, uncomplicated: Secondary | ICD-10-CM | POA: Insufficient documentation

## 2016-11-08 DIAGNOSIS — Z7951 Long term (current) use of inhaled steroids: Secondary | ICD-10-CM | POA: Insufficient documentation

## 2016-11-08 DIAGNOSIS — N289 Disorder of kidney and ureter, unspecified: Secondary | ICD-10-CM | POA: Insufficient documentation

## 2016-11-08 DIAGNOSIS — K611 Rectal abscess: Secondary | ICD-10-CM | POA: Diagnosis present

## 2016-11-08 DIAGNOSIS — J45909 Unspecified asthma, uncomplicated: Secondary | ICD-10-CM | POA: Diagnosis not present

## 2016-11-08 DIAGNOSIS — Z79899 Other long term (current) drug therapy: Secondary | ICD-10-CM | POA: Insufficient documentation

## 2016-11-08 LAB — COMPREHENSIVE METABOLIC PANEL
ALT: 25 U/L (ref 17–63)
AST: 22 U/L (ref 15–41)
Albumin: 4.5 g/dL (ref 3.5–5.0)
Alkaline Phosphatase: 57 U/L (ref 38–126)
Anion gap: 7 (ref 5–15)
BUN: 15 mg/dL (ref 6–20)
CO2: 28 mmol/L (ref 22–32)
Calcium: 9.6 mg/dL (ref 8.9–10.3)
Chloride: 105 mmol/L (ref 101–111)
Creatinine, Ser: 1.3 mg/dL — ABNORMAL HIGH (ref 0.61–1.24)
GFR calc Af Amer: >60 mL/min (ref >60)
GFR calc non Af Amer: >60 mL/min (ref >60)
Glucose, Bld: 101 mg/dL — ABNORMAL HIGH (ref 65–99)
Potassium: 4 mmol/L (ref 3.5–5.1)
Sodium: 140 mmol/L (ref 135–145)
Total Bilirubin: 0.5 mg/dL (ref 0.3–1.2)
Total Protein: 7.6 g/dL (ref 6.5–8.1)

## 2016-11-08 LAB — CBC
HCT: 45.9 % (ref 39.0–52.0)
Hemoglobin: 15.6 g/dL (ref 13.0–17.0)
MCH: 28.3 pg (ref 26.0–34.0)
MCHC: 34 g/dL (ref 30.0–36.0)
MCV: 83.3 fL (ref 78.0–100.0)
Platelets: 255 10*3/uL (ref 150–400)
RBC: 5.51 MIL/uL (ref 4.22–5.81)
RDW: 14 % (ref 11.5–15.5)
WBC: 10.5 10*3/uL (ref 4.0–10.5)

## 2016-11-08 MED ORDER — ONDANSETRON 4 MG PO TBDP: 4.0000 mg | ORAL_TABLET | Freq: Four times a day (QID) | ORAL | Status: DC | PRN

## 2016-11-08 MED ORDER — ENOXAPARIN SODIUM 40 MG/0.4ML ~~LOC~~ SOLN
40.0000 mg | SUBCUTANEOUS | Status: DC
  Administered 2016-11-08: 40 mg via SUBCUTANEOUS
  Filled 2016-11-08: qty 0.4

## 2016-11-08 MED ORDER — DOCUSATE SODIUM 100 MG PO CAPS
100.0000 mg | ORAL_CAPSULE | Freq: Two times a day (BID) | ORAL | Status: DC
  Administered 2016-11-08 – 2016-11-09 (×2): 100 mg via ORAL
  Filled 2016-11-08 (×2): qty 1

## 2016-11-08 MED ORDER — ACETAMINOPHEN 650 MG RE SUPP: 650.0000 mg | Freq: Four times a day (QID) | RECTAL | Status: DC | PRN

## 2016-11-08 MED ORDER — DIPHENHYDRAMINE HCL 12.5 MG/5ML PO ELIX: 12.5000 mg | ORAL_SOLUTION | Freq: Four times a day (QID) | ORAL | Status: DC | PRN

## 2016-11-08 MED ORDER — OXYCODONE HCL 5 MG PO TABS
5.0000 mg | ORAL_TABLET | ORAL | Status: DC | PRN
  Administered 2016-11-08: 10 mg via ORAL
  Administered 2016-11-09 (×2): 5 mg via ORAL
  Filled 2016-11-08: qty 1
  Filled 2016-11-08: qty 2
  Filled 2016-11-08: qty 1

## 2016-11-08 MED ORDER — SODIUM CHLORIDE 0.9 % IV SOLN
INTRAVENOUS | Status: DC
  Administered 2016-11-08 (×2): via INTRAVENOUS

## 2016-11-08 MED ORDER — DIPHENHYDRAMINE HCL 50 MG/ML IJ SOLN: 12.5000 mg | Freq: Four times a day (QID) | INTRAMUSCULAR | Status: DC | PRN

## 2016-11-08 MED ORDER — ZOLPIDEM TARTRATE 5 MG PO TABS: 5.0000 mg | ORAL_TABLET | Freq: Every evening | ORAL | Status: DC | PRN

## 2016-11-08 MED ORDER — CIPROFLOXACIN IN D5W 400 MG/200ML IV SOLN
400.0000 mg | Freq: Two times a day (BID) | INTRAVENOUS | Status: DC
  Administered 2016-11-08 – 2016-11-09 (×2): 400 mg via INTRAVENOUS
  Filled 2016-11-08 (×2): qty 200

## 2016-11-08 MED ORDER — METRONIDAZOLE 500 MG PO TABS
500.0000 mg | ORAL_TABLET | Freq: Three times a day (TID) | ORAL | Status: DC
  Administered 2016-11-08 – 2016-11-09 (×3): 500 mg via ORAL
  Filled 2016-11-08 (×3): qty 1

## 2016-11-08 MED ORDER — ONDANSETRON HCL 4 MG/2ML IJ SOLN: 4.0000 mg | Freq: Four times a day (QID) | INTRAMUSCULAR | Status: DC | PRN

## 2016-11-08 MED ORDER — MORPHINE SULFATE (PF) 4 MG/ML IV SOLN
1.0000 mg | INTRAVENOUS | Status: DC | PRN
  Administered 2016-11-08 (×2): 2 mg via INTRAVENOUS
  Administered 2016-11-09: 1 mg via INTRAVENOUS
  Administered 2016-11-09 (×2): 3 mg via INTRAVENOUS
  Filled 2016-11-08 (×5): qty 1

## 2016-11-08 MED ORDER — ACETAMINOPHEN 325 MG PO TABS
650.0000 mg | ORAL_TABLET | Freq: Four times a day (QID) | ORAL | Status: DC | PRN
  Administered 2016-11-08: 650 mg via ORAL
  Filled 2016-11-08: qty 2

## 2016-11-08 NOTE — Anesthesia Preprocedure Evaluation (Addendum)
Anesthesia Evaluation  Patient identified by MRN, date of birth, ID band Patient awake    Reviewed: Allergy & Precautions, NPO status , Patient's Chart, lab work & pertinent test results  History of Anesthesia Complications Negative for: history of anesthetic complications  Airway Mallampati: I  TM Distance: >3 FB Neck ROM: Full    Dental  (+) Dental Advisory Given, Chipped   Pulmonary Current Smoker,    breath sounds clear to auscultation       Cardiovascular (-) anginanegative cardio ROS   Rhythm:Regular Rate:Normal     Neuro/Psych negative neurological ROS     GI/Hepatic negative GI ROS, (+)     substance abuse  alcohol use and marijuana use,   Endo/Other  negative endocrine ROS  Renal/GU negative Renal ROS     Musculoskeletal negative musculoskeletal ROS (+)   Abdominal   Peds  Hematology negative hematology ROS (+)   Anesthesia Other Findings   Reproductive/Obstetrics                             Anesthesia Physical  Anesthesia Plan  ASA: II  Anesthesia Plan: General   Post-op Pain Management:    Induction: Intravenous  Airway Management Planned: Oral ETT  Additional Equipment:   Intra-op Plan:   Post-operative Plan: Extubation in OR  Informed Consent: I have reviewed the patients History and Physical, chart, labs and discussed the procedure including the risks, benefits and alternatives for the proposed anesthesia with the patient or authorized representative who has indicated his/her understanding and acceptance.   Dental advisory given  Plan Discussed with: CRNA  Anesthesia Plan Comments:         Anesthesia Quick Evaluation

## 2016-11-08 NOTE — H&P (Signed)
Mindi SlickerKikkimon Mcfadyen 11/08/2016 2:01 PM Location: [facility] Patient #: [id number] DOB: 1976/08/28 Married / Language: English / Race: Black or African American Male  History of Present Illness [name](Barbette Mcglaun M. Coley Kulikowski MD; 11/08/2016 3:32 PM) Patient words: fissure.  The patient is a 41 year old male who presents with a perirectal abscess. He comes in to urgent office with complaints of recurrent left perirectal pain. He underwent incision and drainage of a large perirectal abscess by Dr. Corliss Skainssuei in September 2016. He denies any issues since that time. He states late last week he started having recurrent left perirectal pain. He was in the same location. It got more tender. He went to the emergency room on the 24th and had normal labs but a CT scan of his pelvis demonstrated recurrent inflammation with possible early fistulous tract. He declined I&D at that time in the ER. He comes in today for evaluation. He states the area is very tender and difficult to sit on. He denies any fevers or chills. He denies any vomiting. He reports a good appetite. He denies any dysuria. He denies any personal history of ulcerative colitis or Crohn's. He does smoke however a pack may last him 1 week  Review of systems-12 point review systems was performed and all systems are negative except for what is mentioned in HPI   Problem List/Past Medical [name](Teylor Wolven M. 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 Clindamycin HCl (150MG  Capsule, Oral) Active. Medications Reconciled  Social History [name](Reiley Keisler M. Andrey CampanileWilson, MD; 11/08/2016 3:34 PM) Alcohol use Occasional alcohol use.  Family History [name](Jayleen Scaglione M. Andrey CampanileWilson, MD; 11/08/2016 3:34 PM) First Degree Relatives No pertinent family history  Other  Problems [name](Tanequa Kretz M. Andrey CampanileWilson, MD; 11/08/2016 3:34 PM) Asthma    Vitals (Ammie Eversole LPN; 1/32/44012/26/2018 0:272:02 PM) 11/08/2016 2:01 PM Weight: 194.6 lb Height: 68in Body Surface Area: 2.02 m Body Mass Index: 29.59 kg/m  Temp.: 98.32F(Oral)  Pulse: 98 (Regular)  BP: 120/70 (Sitting, Left Arm, Standard)      Physical Exam [name](Elisheba Mcdonnell M. Shavette Shoaff MD; 11/08/2016 3:33 PM)  General Mental Status-Alert. General Appearance-Consistent with stated age. Hydration-Well hydrated. Voice-Normal.  Head and Neck Head-normocephalic, atraumatic with no lesions or palpable masses. Trachea-midline. Thyroid Gland Characteristics - normal size and consistency.  Eye Eyeball - Bilateral-Extraocular movements intact. Sclera/Conjunctiva - Bilateral-No scleral icterus.  Chest and Lung Exam Chest and lung exam reveals -quiet, even and easy respiratory effort with no use of accessory muscles and on auscultation, normal breath sounds, no adventitious sounds and normal vocal resonance. Inspection Chest Wall - Normal. Back - normal.  Breast - Did not examine.  Cardiovascular Cardiovascular examination reveals -normal heart sounds, regular rate and rhythm with no murmurs and normal pedal pulses bilaterally.  Abdomen Inspection Inspection of the abdomen reveals - No Hernias. Skin - Scar - no surgical scars. Palpation/Percussion Palpation and Percussion of the abdomen reveal - Soft, Non Tender, No Rebound tenderness, No Rigidity (guarding) and No hepatosplenomegaly. Auscultation Auscultation of the abdomen reveals - Bowel sounds normal.  Rectal Note: Visual inspection only. He has an old scar in the medial left gluteal region. This is area where has tenderness and there is some induration and some mild fluctuance but no cellulitis. Area of concern is around 3 cm x 2 cm. Digital rectal exam deferred. no prolapsed or thrombosed hemorrhoids  Peripheral Vascular Upper  Extremity  Palpation - Pulses bilaterally normal.  Neurologic Neurologic evaluation reveals -alert and oriented x 3 with no impairment of recent or remote memory. Mental Status-Normal.  Neuropsychiatric The patient's mood and affect are described as -normal. Judgment and Insight-insight is appropriate concerning matters relevant to self.  Musculoskeletal Normal Exam - Left-Upper Extremity Strength Normal and Lower Extremity Strength Normal. Normal Exam - Right-Upper Extremity Strength Normal and Lower Extremity Strength Normal.  Lymphatic Head & Neck  General Head & Neck Lymphatics: Bilateral - Description - Normal. Axillary - Did not examine. Femoral & Inguinal - Did not examine.    Assessment & Plan [name] M. Idaly Verret MD; 11/08/2016 3:34 PM)  Ocie Cornfield ABSCESS (K61.1) Impression: He has recurrent left perirectal abscess. I recommended incision and drainage and antibiotics. We discussed performing this in the office. However the patient declined. He says he does not think he can tolerate it. I do not necessarily disagree. Because he has recurrence in the same location we discussed that this is a possible fistula. He was given Agricultural engineer. We discussed that it is in fact a fistula it would have to be a staged repair. First priority business is to drain the abscess. He will be directly admitted to [facility] hospital started on antibiotics this evening. Plans will be to go to the operating room for exam under anesthesia and incision and drainage of perirectal abscess tomorrow. I don't think initially to the operating room this evening. Discussed the case with Dr. Johna Sheriff  Current Plans Pt Education - [employer] Free Text Education/Instructions: discussed with patient and provided information. 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 Andrey Campanile, MD, FACS General, Bariatric, & Minimally Invasive Surgery [facility], [place]

## 2016-11-09 ENCOUNTER — Inpatient Hospital Stay (HOSPITAL_COMMUNITY): Payer: No Typology Code available for payment source | Admitting: Anesthesiology

## 2016-11-09 ENCOUNTER — Inpatient Hospital Stay: Admit: 2016-11-09 | Payer: BLUE CROSS/BLUE SHIELD | Admitting: General Surgery

## 2016-11-09 ENCOUNTER — Encounter (HOSPITAL_COMMUNITY): Payer: Self-pay

## 2016-11-09 ENCOUNTER — Encounter (HOSPITAL_COMMUNITY): Admission: AD | Disposition: A | Discharge status: Home / Self Care | Payer: Self-pay | Source: Ambulatory Visit

## 2016-11-09 DIAGNOSIS — K611 Rectal abscess: Secondary | ICD-10-CM | POA: Diagnosis not present

## 2016-11-09 HISTORY — PX: INCISION AND DRAINAGE PERIRECTAL ABSCESS: SHX1804

## 2016-11-09 LAB — SURGICAL PCR SCREEN
MRSA, PCR: NEGATIVE
Staphylococcus aureus: NEGATIVE

## 2016-11-09 SURGERY — INCISION AND DRAINAGE, ABSCESS, PERIRECTAL
Anesthesia: General

## 2016-11-09 MED ORDER — PROMETHAZINE HCL 25 MG/ML IJ SOLN: 6.2500 mg | INTRAMUSCULAR | Status: DC | PRN

## 2016-11-09 MED ORDER — FENTANYL CITRATE (PF) 100 MCG/2ML IJ SOLN
INTRAMUSCULAR | Status: DC | PRN
  Administered 2016-11-09 (×4): 50 ug via INTRAVENOUS

## 2016-11-09 MED ORDER — SUCCINYLCHOLINE CHLORIDE 200 MG/10ML IV SOSY
PREFILLED_SYRINGE | INTRAVENOUS | Status: DC | PRN
  Administered 2016-11-09: 100 mg via INTRAVENOUS

## 2016-11-09 MED ORDER — HYDROMORPHONE HCL 1 MG/ML IJ SOLN: 0.2500 mg | INTRAMUSCULAR | Status: DC | PRN

## 2016-11-09 MED ORDER — METRONIDAZOLE 500 MG PO TABS: 500.0000 mg | ORAL_TABLET | Freq: Three times a day (TID) | ORAL | 0 refills | Status: DC

## 2016-11-09 MED ORDER — OXYCODONE HCL 5 MG PO TABS: 5.0000 mg | ORAL_TABLET | ORAL | 0 refills | Status: DC | PRN

## 2016-11-09 MED ORDER — BUPIVACAINE LIPOSOME 1.3 % IJ SUSP
20.0000 mL | Freq: Once | INTRAMUSCULAR | Status: AC
  Administered 2016-11-09: 20 mL
  Filled 2016-11-09: qty 20

## 2016-11-09 MED ORDER — FENTANYL CITRATE (PF) 100 MCG/2ML IJ SOLN
INTRAMUSCULAR | Status: AC
  Filled 2016-11-09: qty 4

## 2016-11-09 MED ORDER — 0.9 % SODIUM CHLORIDE (POUR BTL) OPTIME
TOPICAL | Status: DC | PRN
  Administered 2016-11-09: 1000 mL

## 2016-11-09 MED ORDER — MEPERIDINE HCL 50 MG/ML IJ SOLN: 6.2500 mg | INTRAMUSCULAR | Status: DC | PRN

## 2016-11-09 MED ORDER — LIDOCAINE 2% (20 MG/ML) 5 ML SYRINGE
INTRAMUSCULAR | Status: DC | PRN
  Administered 2016-11-09: 100 mg via INTRAVENOUS

## 2016-11-09 MED ORDER — ALBUTEROL SULFATE (2.5 MG/3ML) 0.083% IN NEBU: 3.0000 mL | INHALATION_SOLUTION | RESPIRATORY_TRACT | Status: DC | PRN

## 2016-11-09 MED ORDER — DEXAMETHASONE SODIUM PHOSPHATE 10 MG/ML IJ SOLN
INTRAMUSCULAR | Status: AC
  Filled 2016-11-09: qty 1

## 2016-11-09 MED ORDER — MIDAZOLAM HCL 5 MG/5ML IJ SOLN
INTRAMUSCULAR | Status: DC | PRN
  Administered 2016-11-09: 2 mg via INTRAVENOUS

## 2016-11-09 MED ORDER — ONDANSETRON HCL 4 MG/2ML IJ SOLN
INTRAMUSCULAR | Status: AC
  Filled 2016-11-09: qty 2

## 2016-11-09 MED ORDER — FLUTICASONE FUROATE-VILANTEROL 100-25 MCG/INH IN AEPB
1.0000 | INHALATION_SPRAY | Freq: Every day | RESPIRATORY_TRACT | Status: DC
  Administered 2016-11-09: 12:00:00 1 via RESPIRATORY_TRACT
  Filled 2016-11-09: qty 28

## 2016-11-09 MED ORDER — MIDAZOLAM HCL 2 MG/2ML IJ SOLN
INTRAMUSCULAR | Status: AC
  Filled 2016-11-09: qty 2

## 2016-11-09 MED ORDER — ONDANSETRON HCL 4 MG/2ML IJ SOLN
INTRAMUSCULAR | Status: DC | PRN
  Administered 2016-11-09: 4 mg via INTRAVENOUS

## 2016-11-09 MED ORDER — PROPOFOL 10 MG/ML IV BOLUS
INTRAVENOUS | Status: AC
  Filled 2016-11-09: qty 20

## 2016-11-09 MED ORDER — LIDOCAINE 2% (20 MG/ML) 5 ML SYRINGE
INTRAMUSCULAR | Status: AC
  Filled 2016-11-09: qty 5

## 2016-11-09 MED ORDER — LACTATED RINGERS IV SOLN
INTRAVENOUS | Status: DC | PRN
  Administered 2016-11-09: 07:00:00 via INTRAVENOUS
  Administered 2016-11-09: 1000 mL

## 2016-11-09 MED ORDER — BUPIVACAINE HCL (PF) 0.25 % IJ SOLN
INTRAMUSCULAR | Status: DC | PRN
  Administered 2016-11-09: 20 mL

## 2016-11-09 MED ORDER — CIPROFLOXACIN HCL 500 MG PO TABS: 500.0000 mg | ORAL_TABLET | Freq: Two times a day (BID) | ORAL | Status: DC

## 2016-11-09 MED ORDER — DEXAMETHASONE SODIUM PHOSPHATE 10 MG/ML IJ SOLN
INTRAMUSCULAR | Status: DC | PRN
  Administered 2016-11-09: 10 mg via INTRAVENOUS

## 2016-11-09 MED ORDER — SUGAMMADEX SODIUM 200 MG/2ML IV SOLN
INTRAVENOUS | Status: AC
  Filled 2016-11-09: qty 2

## 2016-11-09 MED ORDER — ROCURONIUM BROMIDE 50 MG/5ML IV SOSY
PREFILLED_SYRINGE | INTRAVENOUS | Status: AC
  Filled 2016-11-09: qty 5

## 2016-11-09 MED ORDER — SUGAMMADEX SODIUM 200 MG/2ML IV SOLN
INTRAVENOUS | Status: DC | PRN
  Administered 2016-11-09: 170 mg via INTRAVENOUS

## 2016-11-09 MED ORDER — ROCURONIUM BROMIDE 10 MG/ML (PF) SYRINGE
PREFILLED_SYRINGE | INTRAVENOUS | Status: DC | PRN
  Administered 2016-11-09: 20 mg via INTRAVENOUS

## 2016-11-09 MED ORDER — ACETAMINOPHEN 325 MG PO TABS: ORAL_TABLET | ORAL | Status: DC

## 2016-11-09 MED ORDER — OXYCODONE HCL 5 MG PO TABS: 5.0000 mg | ORAL_TABLET | Freq: Once | ORAL | Status: DC | PRN

## 2016-11-09 MED ORDER — CIPROFLOXACIN HCL 500 MG PO TABS: 500.0000 mg | ORAL_TABLET | Freq: Two times a day (BID) | ORAL | 0 refills | Status: DC

## 2016-11-09 MED ORDER — OXYCODONE HCL 5 MG/5ML PO SOLN
5.0000 mg | Freq: Once | ORAL | Status: DC | PRN
  Filled 2016-11-09: qty 5

## 2016-11-09 MED ORDER — PROPOFOL 10 MG/ML IV BOLUS
INTRAVENOUS | Status: DC | PRN
  Administered 2016-11-09: 200 mg via INTRAVENOUS
  Administered 2016-11-09: 100 mg via INTRAVENOUS

## 2016-11-09 MED ORDER — BUPIVACAINE HCL (PF) 0.25 % IJ SOLN
INTRAMUSCULAR | Status: AC
  Filled 2016-11-09: qty 30

## 2016-11-09 SURGICAL SUPPLY — 24 items
BLADE HEX COATED 2.75 (ELECTRODE) ×2 IMPLANT
BLADE SURG 15 STRL LF DISP TIS (BLADE) ×1 IMPLANT
BLADE SURG 15 STRL SS (BLADE) ×2
COVER SURGICAL LIGHT HANDLE (MISCELLANEOUS) ×4 IMPLANT
ELECT PENCIL ROCKER SW 15FT (MISCELLANEOUS) ×2 IMPLANT
ELECT REM PT RETURN 9FT ADLT (ELECTROSURGICAL) ×2
ELECTRODE REM PT RTRN 9FT ADLT (ELECTROSURGICAL) ×1 IMPLANT
GAUZE PACKING IODOFORM 1/4X15 (GAUZE/BANDAGES/DRESSINGS) ×2 IMPLANT
GAUZE SPONGE 4X4 12PLY STRL (GAUZE/BANDAGES/DRESSINGS) ×2 IMPLANT
GAUZE SPONGE 4X4 16PLY XRAY LF (GAUZE/BANDAGES/DRESSINGS) ×2 IMPLANT
GLOVE BIOGEL PI IND STRL 7.0 (GLOVE) ×1 IMPLANT
GLOVE BIOGEL PI INDICATOR 7.0 (GLOVE) ×1
GOWN STRL REUS W/TWL LRG LVL3 (GOWN DISPOSABLE) ×2 IMPLANT
GOWN STRL REUS W/TWL XL LVL3 (GOWN DISPOSABLE) ×4 IMPLANT
KIT BASIN OR (CUSTOM PROCEDURE TRAY) ×2 IMPLANT
LUBRICANT JELLY K Y 4OZ (MISCELLANEOUS) IMPLANT
NEEDLE HYPO 25X1 1.5 SAFETY (NEEDLE) IMPLANT
PACK LITHOTOMY IV (CUSTOM PROCEDURE TRAY) ×2 IMPLANT
SOL PREP PROV IODINE SCRUB 4OZ (MISCELLANEOUS) ×2 IMPLANT
SWAB COLLECTION DEVICE MRSA (MISCELLANEOUS) IMPLANT
SYR CONTROL 10ML LL (SYRINGE) IMPLANT
TOWEL OR 17X26 10 PK STRL BLUE (TOWEL DISPOSABLE) ×2 IMPLANT
UNDERPAD 30X30 INCONTINENT (UNDERPADS AND DIAPERS) ×2 IMPLANT
YANKAUER SUCT BULB TIP 10FT TU (MISCELLANEOUS) ×2 IMPLANT

## 2016-11-09 NOTE — Op Note (Signed)
Preoperative Diagnosis: perirectal abcess   Postoprative Diagnosis: perirectal abcess   Procedure: Procedure(s): Incision and drainage PERIRECTAL ABSCESS   Surgeon: HoGlenna Fellowsxworth, Eunie Lawn T   Assistants: None  Anesthesia:  General endotracheal anesthesia  Indications: Patient is a 41 year old male with a history of a left perirectal abscess drained in September 2016. He presents with several days of increasing pain and swelling in the same site. Examination reveals a fairly large area of induration and tenderness in the left perirectal space. He presented to the ED and a CT was suspicious for perirectal abscess. He denies any chronic drainage from the site. I recommended incision and drainage under general anesthesia in the operating room. The procedure and indications, risks and expected recovery were discussed and understood.    Procedure Detail:  Patient was brought to the operating room, placed in supine position on operating table, and general endotracheal anesthesia induced. He was carefully positioned in lithotomy position with yellowfin stirrups and the perineum widely sterilely prepped and draped. There was a again noted an area of induration in the left posterior perirectal space at the site of previous ID. I did not see any obvious external fistula opening. An ellipse of skin and subcutaneous tissue was excised and dissection was carried down into a fairly large abscess cavity. A large amount of purulence was drained. Cultures were obtained. The cavity was completely explored and extended up along the perirectal space for about 5 cm. All loculations were broken up and the cavity was thoroughly irrigated at this point was widely opened. Hemostasis was obtained with cautery. I carefully explored the area with the rectum exposed using a probe up through the abscess cavity and I could not identify an internal fistula opening. The soft tissue was extensively infiltrated with Exparel diluted with  Marcaine. The cavity was packed with 1/2 inch iodoform gauze. Dry sterile dressings were applied. Sponge needle and instrument counts were correct.    Findings: As above  Estimated Blood Loss:  Minimal         Drains: Wound packed with iodoform gauze  Blood Given: none          Specimens: Culture and sensitivity        Complications:  * No complications entered in OR log *         Disposition: PACU - hemodynamically stable.         Condition: stable

## 2016-11-09 NOTE — Anesthesia Procedure Notes (Signed)
Procedure Name: Intubation Date/Time: 11/09/2016 7:36 AM Performed by: Carleene Cooper A Pre-anesthesia Checklist: Patient identified, Timeout performed, Emergency Drugs available, Suction available and Patient being monitored Patient Re-evaluated:Patient Re-evaluated prior to inductionOxygen Delivery Method: Circle system utilized Preoxygenation: Pre-oxygenation with 100% oxygen Intubation Type: IV induction Ventilation: Mask ventilation without difficulty and Oral airway inserted - appropriate to patient size Laryngoscope Size: Mac and 4 Grade View: Grade I Tube type: Oral Tube size: 7.5 mm Number of attempts: 3 Airway Equipment and Method: Stylet Placement Confirmation: ETT inserted through vocal cords under direct vision,  positive ETCO2 and breath sounds checked- equal and bilateral Secured at: 23 cm Tube secured with: Tape Dental Injury: Teeth and Oropharynx as per pre-operative assessment  Comments: DL x 2 by CRNA. Grade 1-2 view. - ETCO2. Masked. DL x 1 by Dr. Lissa Hoard after head repositioned. Grade 1 view. ATOI. ETT secured at 23cm at the lip.

## 2016-11-09 NOTE — Interval H&P Note (Signed)
History and Physical Interval Note:  11/09/2016 7:25 AM  Jay Gentry  has presented today for surgery, with the diagnosis of peri rectal abscess  The various methods of treatment have been discussed with the patient and family. After consideration of risks, benefits and other options for treatment, the patient has consented to  Procedure(s): IRRIGATION AND DEBRIDEMENT PERIRECTAL ABSCESS (N/A) as a surgical intervention .  The patient's history has been reviewed, patient examined, no change in status, stable for surgery.  I have reviewed the patient's chart and labs.  Questions were answered to the patient's satisfaction.     [name]

## 2016-11-09 NOTE — Progress Notes (Signed)
Replaced outer dressing after pt used BR. Small, soft BM noted. Packing intact.

## 2016-11-09 NOTE — Transfer of Care (Signed)
Immediate Anesthesia Transfer of Care Note  Patient: Jay Gentry  Procedure(s) Performed: Procedure(s): IRRIGATION AND DEBRIDEMENT PERIRECTAL ABSCESS (N/A)  Patient Location: PACU  Anesthesia Type:General  Level of Consciousness: awake, alert , oriented and patient cooperative  Airway & Oxygen Therapy: Patient Spontanous Breathing and Patient connected to face mask oxygen  Post-op Assessment: Report given to RN, Post -op Vital signs reviewed and stable and Patient moving all extremities  Post vital signs: Reviewed and stable  Last Vitals:  Vitals:   11/09/16 0140 11/09/16 0422  BP: 121/79 133/83  Pulse: 82 70  Resp: 18 18  Temp: 36.8 C 36.8 C    Last Pain:  Vitals:   11/09/16 0624  TempSrc:   PainSc: 10-Worst pain ever      Patients Stated Pain Goal: 2 (11/09/16 [id number])  Complications: No apparent anesthesia complications

## 2016-11-09 NOTE — Discharge Summary (Signed)
Physician Discharge Summary  Patient ID: Jay Gentry MRN: [id number] DOB/AGE: 1976/08/18 41 y.o.  Admit date: 11/08/2016 Discharge date: 11/09/2016  Admission Diagnoses:  Perirectal abscess  Asthma   Discharge Diagnoses:  Perirectal abscess Asthma Mild renal insufficiency -  creatinine 1.3   Rectal abscess Asthma Active Problems:   Perirectal abscess   PROCEDURES: Incision and drainage of perirectal abscess, 11[facility]09[facility]16 [facility]  [facility] Course:  The patient is a 41 year old male who presents with a perirectal abscess. He comes in to urgent office with complaints of recurrent left perirectal pain. He underwent incision and drainage of a large perirectal abscess by Dr. Corliss Skainssuei in September 2016. He denies any issues since that time. He states late last week he started having recurrent left perirectal pain. He was in the same location. It got more tender. He went to the emergency room on the 24th and had normal labs but a CT scan of his pelvis demonstrated recurrent inflammation with possible early fistulous tract. He declined I&D at that time in the ER. He comes in today for evaluation. He states the area is very tender and difficult to sit on. He denies any fevers or chills. He denies any vomiting. He reports a good appetite. He denies any dysuria. He denies any personal history of ulcerative colitis or Crohn's. He does smoke however a pack may last him 1 week He was seen by Dr. Andrey CampanileWilson in the office and admitted. Started on antibiotics and taken the operating room the following a.m. He underwent incision and drainage of the perirectal abscess. He tolerated the procedure well and later in the afternoon asked to be discharged home. We plan to have him soak in a sitz bath tomorrow and remove the packing. He is to keep a pad in the area to protect his underwear from soiling. He is to do a sitz bath 3-4 times a day and shower when necessary for fecal soiling. Return  to send him home on 10 days of Cipro and Flagyl. He has a baseline creatinine yesterday of 1.3. He was originally seen for this on 11/06/16. At that time his creatinine was 1.17. He reports taking Aleve frequently for his pains. He also works Holiday representativeconstruction, and has a lot of muscle aches and discomfort. I recommended he discontinue using any NSAID. I recommended he go see a primary care for evaluation and follow-up of his elevated creatinine. He also needs refills on his asthma inhalers. I told him to follow-up with primary care and let them manage this. CBC Latest Ref Rng & Units 11/08/2016 11/06/2016 09/14/2016  WBC 4.0 - 10.5 K/uL 10.5 8.2 8.6  Hemoglobin 13.0 - 17.0 g/dL 40.915.6 81.115.7 17.2(H)  Hematocrit 39.0 - 52.0 % 45.9 46.1 48.6  Platelets 150 - 400 K/uL 255 240 204   CMP Latest Ref Rng & Units 11/08/2016 11/06/2016 09/14/2016  Glucose 65 - 99 mg/dL 914(N101(H) 829(F108(H) 621(H101(H)  BUN 6 - 20 mg/dL 15 14 16   Creatinine 0.61 - 1.24 mg/dL 0.86(V1.30(H) 7.841.17 6.96(E1.43(H)  Sodium 135 - 145 mmol/L 140 140 137  Potassium 3.5 - 5.1 mmol/L 4.0 4.1 3.8  Chloride 101 - 111 mmol/L 105 106 102  CO2 22 - 32 mmol/L 28 22 21(L)  Calcium 8.9 - 10.3 mg/dL 9.6 9.1 9.8  Total Protein 6.5 - 8.1 g/dL 7.6 - -  Total Bilirubin 0.3 - 1.2 mg/dL 0.5 - -  Alkaline Phos 38 - 126 U/L 57 - -  AST 15 - 41 U/L 22 - -  ALT 17 - 63 U/L 25 - -    Condition ON discharge: Improving  He'll follow-up with Dr. Johna Sheriff in 2-3 weeks. He is to call if he has any issues prior. I have personally reviewed the patients medication history on the [place] controlled substance database.   Disposition: 01-Home or Self Care   Allergies as of 11/09/2016      Reactions   Penicillins Hives   Has patient had a PCN reaction causing immediate rash, facial/tongue/throat swelling, SOB or lightheadedness with hypotension: yes Has patient had a PCN reaction causing severe rash involving mucus membranes or skin necrosis: no Has patient had a PCN reaction that required  hospitalization no Has patient had a PCN reaction occurring within the last 10 years: no If all of the above answers are "NO", then may proceed with Cephalosporin use.      Medication List    STOP taking these medications   clindamycin 150 MG capsule Commonly known as:  CLEOCIN   HYDROcodone-acetaminophen 5-325 MG tablet Commonly known as:  NORCO/VICODIN     TAKE these medications   acetaminophen 325 MG tablet Commonly known as:  TYLENOL You can take plain Tylenol, 2 tablets every 6 hours as needed for pain.   albuterol 108 (90 Base) MCG/ACT inhaler Commonly known as:  PROVENTIL HFA;VENTOLIN HFA Inhale 2 puffs into the lungs every 4 (four) hours as needed for wheezing. What changed:  how much to take   albuterol (2.5 MG/3ML) 0.083% nebulizer solution Commonly known as:  PROVENTIL Take 3 mLs (2.5 mg total) by nebulization every 4 (four) hours as needed for wheezing or shortness of breath. What changed:  Another medication with the same name was changed. Make sure you understand how and when to take each.   BREO ELLIPTA 100-25 MCG/INH Aepb Generic drug:  fluticasone furoate-vilanterol Take 1 puff by mouth daily.   ciprofloxacin 500 MG tablet Commonly known as:  CIPRO Take 1 tablet (500 mg total) by mouth 2 (two) times daily.   metroNIDAZOLE 500 MG tablet Commonly known as:  FLAGYL Take 1 tablet (500 mg total) by mouth every 8 (eight) hours. What changed:  when to take this   nicotine 14 mg/24hr patch Commonly known as:  NICODERM CQ - dosed in mg/24 hours Place 1 patch (14 mg total) onto the skin daily.   oxyCODONE 5 MG immediate release tablet Commonly known as:  Oxy IR/ROXICODONE Take 1-2 tablets (5-10 mg total) by mouth every 4 (four) hours as needed for moderate pain.      Follow-up Information    HOXWORTH,BENJAMIN T, MD Follow up.   Specialty:  General Surgery Why:  Call for follow up appointment in 2-3 weeks. Contact information: [address] [place] [address] [phone]        Primary care physician Follow up.   Why:  Call and get a primary care doctor to help with your asthma and your elevated creatinine.Try to do it this week.            SignedSherrie George 11/09/2016, 3:09 PM

## 2016-11-09 NOTE — H&P (View-Only) (Signed)
Jay Gentry 11/08/2016 2:01 PM Location: [facility] Patient #: [id number] DOB: 1976/08/28 Married / Language: English / Race: Black or African American Male  History of Present Illness Jay Gentry(Jay Hommes M. Tinaya Ceballos MD; 11/08/2016 3:32 PM) Patient words: fissure.  The patient is a 41 year old male who presents with a perirectal abscess. He comes in to urgent office with complaints of recurrent left perirectal pain. He underwent incision and drainage of a large perirectal abscess by Dr. Corliss Skainssuei in September 2016. He denies any issues since that time. He states late last week he started having recurrent left perirectal pain. He was in the same location. It got more tender. He went to the emergency room on the 24th and had normal labs but a CT scan of his pelvis demonstrated recurrent inflammation with possible early fistulous tract. He declined I&D at that time in the ER. He comes in today for evaluation. He states the area is very tender and difficult to sit on. He denies any fevers or chills. He denies any vomiting. He reports a good appetite. He denies any dysuria. He denies any personal history of ulcerative colitis or Crohn's. He does smoke however a pack may last him 1 week  Review of systems-12 point review systems was performed and all systems are negative except for what is mentioned in HPI   Problem List/Past Medical Jay Gentry(Jay Chovan M. 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 Clindamycin HCl (150MG  Capsule, Oral) Active. Medications Reconciled  Social History Jay Gentry(Jay Lindamood M. Andrey CampanileWilson, MD; 11/08/2016 3:34 PM) Alcohol use Occasional alcohol use.  Family History Jay Gentry(Jay Zwart M. Andrey CampanileWilson, MD; 11/08/2016 3:34 PM) First Degree Relatives No pertinent family history  Other  Problems Jay Gentry(Jay Luepke M. Andrey CampanileWilson, MD; 11/08/2016 3:34 PM) Asthma    Vitals (Jay Eversole LPN; 1/32/44012/26/2018 0:272:02 PM) 11/08/2016 2:01 PM Weight: 194.6 lb Height: 68in Body Surface Area: 2.02 m Body Mass Index: 29.59 kg/m  Temp.: 98.32F(Oral)  Pulse: 98 (Regular)  BP: 120/70 (Sitting, Left Arm, Standard)      Physical Exam Jay Gentry(Jay Bonneau M. Aarit Kashuba MD; 11/08/2016 3:33 PM)  General Mental Status-Alert. General Appearance-Consistent with stated age. Hydration-Well hydrated. Voice-Normal.  Head and Neck Head-normocephalic, atraumatic with no lesions or palpable masses. Trachea-midline. Thyroid Gland Characteristics - normal size and consistency.  Eye Eyeball - Bilateral-Extraocular movements intact. Sclera/Conjunctiva - Bilateral-No scleral icterus.  Chest and Lung Exam Chest and lung exam reveals -quiet, even and easy respiratory effort with no use of accessory muscles and on auscultation, normal breath sounds, no adventitious sounds and normal vocal resonance. Inspection Chest Wall - Normal. Back - normal.  Breast - Did not examine.  Cardiovascular Cardiovascular examination reveals -normal heart sounds, regular rate and rhythm with no murmurs and normal pedal pulses bilaterally.  Abdomen Inspection Inspection of the abdomen reveals - No Hernias. Skin - Scar - no surgical scars. Palpation/Percussion Palpation and Percussion of the abdomen reveal - Soft, Non Tender, No Rebound tenderness, No Rigidity (guarding) and No hepatosplenomegaly. Auscultation Auscultation of the abdomen reveals - Bowel sounds normal.  Rectal Note: Visual inspection only. He has an old scar in the medial left gluteal region. This is area where has tenderness and there is some induration and some mild fluctuance but no cellulitis. Area of concern is around 3 cm x 2 cm. Digital rectal exam deferred. no prolapsed or thrombosed hemorrhoids  Peripheral Vascular Upper  Extremity  Palpation - Pulses bilaterally normal.  Neurologic Neurologic evaluation reveals -alert and oriented x 3 with no impairment of recent or remote memory. Mental Status-Normal.  Neuropsychiatric The patient's mood and affect are described as -normal. Judgment and Insight-insight is appropriate concerning matters relevant to self.  Musculoskeletal Normal Exam - Left-Upper Extremity Strength Normal and Lower Extremity Strength Normal. Normal Exam - Right-Upper Extremity Strength Normal and Lower Extremity Strength Normal.  Lymphatic Head & Neck  General Head & Neck Lymphatics: Bilateral - Description - Normal. Axillary - Did not examine. Femoral & Inguinal - Did not examine.    Assessment & Plan Jay Gentry M. Tiandra Swoveland MD; 11/08/2016 3:34 PM)  Jay Gentry ABSCESS (K61.1) Impression: He has recurrent left perirectal abscess. I recommended incision and drainage and antibiotics. We discussed performing this in the office. However the patient declined. He says he does not think he can tolerate it. I do not necessarily disagree. Because he has recurrence in the same location we discussed that this is a possible fistula. He was given Agricultural engineer. We discussed that it is in fact a fistula it would have to be a staged repair. First priority business is to drain the abscess. He will be directly admitted to [facility] started on antibiotics this evening. Plans will be to go to the operating room for exam under anesthesia and incision and drainage of perirectal abscess tomorrow. I don't think initially to the operating room this evening. Discussed the case with Dr. Johna Sheriff  Current Plans Pt Education - [employer] Free Text Education/Instructions: discussed with patient and provided information. 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 Andrey Campanile, MD, FACS General, Bariatric, & Minimally Invasive Surgery [facility], [place]

## 2016-11-09 NOTE — Progress Notes (Signed)
PHARMACIST - PHYSICIAN COMMUNICATION DR:   Johna SheriffHoxworth CONCERNING: Antibiotic IV to Oral Route Change Policy  RECOMMENDATION: This patient is receiving Cipro by the intravenous route.  Based on criteria approved by the Pharmacy and Therapeutics Committee, the antibiotic(s) is/are being converted to the equivalent oral dose form(s).   DESCRIPTION: These criteria include:  Patient being treated for a respiratory tract infection, urinary tract infection, cellulitis or clostridium difficile associated diarrhea if on metronidazole  The patient is not neutropenic and does not exhibit a GI malabsorption state  The patient is eating (either orally or via tube) and/or has been taking other orally administered medications for a least 24 hours  The patient is improving clinically and has a Tmax < 100.5  If you have questions about this conversion, please contact the Pharmacy Department  []   [phone]( [phone] )  [facility] []   [phone]( [phone] )  [facility] []   [phone]( [phone] )  [facility] []   [phone]( [phone] )  [facility] [x]   [phone]( [phone] )  [facility]  Hessie KnowsJustin M Destiney Sanabia, PharmD, [place] Pager [phone] 11/09/2016 10:16 AM

## 2016-11-09 NOTE — Progress Notes (Signed)
Assessment unchanged. Pt verbalized understanding of dc instructions through teach back including meds, follow up care and when to call the doctor. Scripts given as provided by MD. Discharged via foot per request to meet ride at front entrance.

## 2016-11-09 NOTE — Discharge Instructions (Signed)
WOUND CARE  It is important that the wound be kept open.   -Keeping the skin edges apart will allow the wound to gradually heal from the base upwards.   - If the skin edges of the wound close too early, a new fluid pocket can form and infection can occur. -This is the reason to pack deeper wounds with gauze or ribbon -This is why drained wounds cannot be sewed closed right away  A healthy wound should form a lining of bright red "beefy" granulating tissue that will help shrink the wound and help the edges grow new skin into it.   -A little mucus / yellow discharge is normal (the body's natural way to try and form a scab) and should be gently washed off with soap and water with daily dressing changes.  -Green or foul smelling drainage implies bacterial colonization and can slow wound healing - a short course of antibiotic ointment (3-5 days) can help it clear up.  Call the doctor if it does not improve or worsens  -Avoid use of antibiotic ointments for more than a week as they can slow wound healing over time.    -Sometimes other wound care products will be used to reduce need for dressing changes and/or help clean up dirty wounds -Sometimes the surgeon needs to debride the wound in the office to remove dead or infected tissue out of the wound so it can heal more quickly and safely.    Change the dressing at least once a day -Wash the wound with mild soap and water gently every day.  It is good to shower or bathe the wound to help it clean out. -Use clean 4x4 gauze for medium/large wounds or ribbon plain NU-gauze for smaller wounds (it does not need to be sterile, just clean) -Keep the raw wound moist with a little saline or KY (saline) gel on the gauze.  -A dry wound will take longer to heal.  -Keep the skin dry around the wound to prevent breakdown and irritation. -Pack the wound down to the base -The goal is to keep the skin apart, not overpack the wound -Use a Q-tip or blunt-tipped kabob  stick toothpick to push the gauze down to the base in narrow or deep wounds   -Cover with a clean gauze and tape -paper or Medipore tape tend to be gentle on the skin -rotate the orientation of the tape to avoid repeated stress/trauma on the skin -using an ACE or Coban wrap on wounds on arms or legs can be used instead.  Complete all antibiotics through the entire prescription to help the infection heal and prevent new places of infection   Returning the see the surgeon is helpful to follow the healing process and help the wound close as fast as possible.  Perirectal Abscess An abscess is an infected area that contains a collection of pus. A perirectal abscess is an abscess that is near the opening of the anus or around the rectum. A perirectal abscess can cause a lot of pain, especially during bowel movements. What are the causes? This condition is almost always caused by an infection that starts in an anal gland. What increases the risk? This condition is more likely to develop in:  People with diabetes or inflammatory bowel disease.  People whose body defense system (immune system) is weak.  People who have anal sex.  People who have a sexually transmitted disease (STD).  People who have certain kinds of cancers, such as rectal  carcinoma, leukemia, or lymphoma. What are the signs or symptoms? The main symptom of this condition is pain. The pain may be a throbbing pain that gets worse during bowel movements. Other symptoms include:  Fever.  Swelling.  Redness.  Bleeding.  Constipation. How is this diagnosed? The condition is diagnosed with a physical exam. If the abscess is not visible, a health care provider may need to place a finger inside the rectum to find the abscess. Sometimes, imaging tests are done to determine the size and location of the abscess. These tests may include:  An ultrasound.  An MRI.  A CT scan. How is this treated? This condition is usually  treated with incision and drainage surgery. Incision and drainage surgery involves making an incision over the abscess to drain the pus. Treatment may also involve antibiotic medicine, pain medicine, stool softeners, or laxatives. Follow these instructions at home:  Take medicines only as directed by your health care provider.  If you were prescribed an antibiotic, finish all of it even if you start to feel better.  To relieve pain, try sitting:  In a warm, shallow bath (sitz bath).  On a heating pad with the setting on low.  On an inflatable donut-shaped cushion.  Follow any diet instructions as directed by your health care provider.  Keep all follow-up visits as directed by your health care provider. This is important. Contact a health care provider if:  Your abscess is bleeding.  You have pain, swelling, or redness that is getting worse.  You are constipated.  You feel ill.  You have muscle aches or chills.  You have a fever.  Your symptoms return after the abscess has healed. This information is not intended to replace advice given to you by your health care provider. Make sure you discuss any questions you have with your health care provider. Document Released: 08/27/2000 Document Revised: 02/05/2016 Document Reviewed: 07/10/2014 Elsevier Interactive Patient Education  2017 [employer]  Sit in a tub of hot water and soak site.  Then remove dressing tomorrow.  Continue Sitz baths and showers 3-4 times a day till it has healed.

## 2016-11-09 NOTE — Anesthesia Postprocedure Evaluation (Addendum)
Anesthesia Post Note  Patient: Chartered certified accountantKikkimon Gentry  Procedure(s) Performed: Procedure(s) (LRB): IRRIGATION AND DEBRIDEMENT PERIRECTAL ABSCESS (N/A)  Patient location during evaluation: PACU Anesthesia Type: General Level of consciousness: sedated and patient cooperative Pain management: pain level controlled Vital Signs Assessment: post-procedure vital signs reviewed and stable Respiratory status: spontaneous breathing Cardiovascular status: stable Anesthetic complications: no       Last Vitals:  Vitals:   11/09/16 1210 11/09/16 1225  BP:  119/66  Pulse: 85 71  Resp: 16 16  Temp:  36.6 C    Last Pain:  Vitals:   11/09/16 1225  TempSrc: Oral  PainSc:                  Lewie LoronJohn Meliya Mcconahy

## 2016-11-12 LAB — AEROBIC/ANAEROBIC CULTURE W GRAM STAIN (SURGICAL/DEEP WOUND)

## 2017-05-09 ENCOUNTER — Ambulatory Visit (HOSPITAL_COMMUNITY)
Admission: EM | Admit: 2017-05-09 | Discharge: 2017-05-09 | Disposition: A | Discharge status: Home / Self Care | Payer: 59 | Attending: Family Medicine | Admitting: Family Medicine

## 2017-05-09 ENCOUNTER — Encounter (HOSPITAL_COMMUNITY): Payer: Self-pay | Admitting: Emergency Medicine

## 2017-05-09 DIAGNOSIS — J45998 Other asthma: Secondary | ICD-10-CM | POA: Diagnosis not present

## 2017-05-09 DIAGNOSIS — J45909 Unspecified asthma, uncomplicated: Secondary | ICD-10-CM

## 2017-05-09 MED ORDER — ALBUTEROL SULFATE HFA 108 (90 BASE) MCG/ACT IN AERS: 1.0000 | INHALATION_SPRAY | RESPIRATORY_TRACT | 1 refills | Status: DC | PRN

## 2017-05-09 NOTE — ED Triage Notes (Signed)
PT has been wheezing this week and ran out of his albuterol inhaler a week ago. He would like a refill

## 2017-05-09 NOTE — ED Provider Notes (Signed)
  [facility]   [id number] 05/09/17 Arrival Time: 1001  ASSESSMENT & PLAN:  1. Mild asthma, unspecified whether complicated, unspecified whether persistent    Meds ordered this encounter  Medications  . albuterol (PROVENTIL HFA;VENTOLIN HFA) 108 (90 Base) MCG/ACT inhaler    Sig: Inhale 1-2 puffs into the lungs every 4 (four) hours as needed for wheezing.    Dispense:  1 Inhaler    Refill:  1   Will establish with PCP; information given. May f/u here as needed. Asthma exacerbation has improved.  Reviewed expectations re: course of current medical issues. Questions answered. Outlined signs and symptoms indicating need for more acute intervention. Patient verbalized understanding. After Visit Summary given.   SUBJECTIVE:  Jay Gentry is a 41 y.o. male who presents with complaint of mild asthma exacerbation for the past 2 days. Using albuterol neb at home which has helped. Currently without wheezing or respiratory difficulty. No fever. No recent illnesses. Requests refill on albuterol MDI. Reports infrequent asthma exacerbations.  ROS: As per HPI.   OBJECTIVE:  Vitals:   05/09/17 1010 05/09/17 1011  BP:  131/90  Pulse:  80  Resp:  16  Temp:  98.7 F (37.1 C)  TempSrc:  Oral  SpO2:  98%  Weight: 190 lb (86.2 kg)   Height: 5\' 8"  (1.727 m)      General appearance: alert; no distress Lungs: clear to auscultation bilaterally Heart: regular rate and rhythm Extremities: no cyanosis or edema; symmetrical with no gross deformities Skin: warm and dry Psychological: alert and cooperative; normal mood and affect  Past Medical History:  Diagnosis Date  . Asthma   . Bronchitis   . Perirectal abscess 05/14/2015    Allergies  Allergen Reactions  . Penicillins Hives    Has patient had a PCN reaction causing immediate rash, facial/tongue/throat swelling, SOB or lightheadedness with hypotension: yes Has patient had a PCN reaction causing severe rash involving  mucus membranes or skin necrosis: no Has patient had a PCN reaction that required hospitalization no Has patient had a PCN reaction occurring within the last 10 years: no If all of the above answers are "NO", then may proceed with Cephalosporin use.     Past Surgical History:  Procedure Laterality Date  . INCISION AND DRAINAGE PERIRECTAL ABSCESS  05/14/2015  . INCISION AND DRAINAGE PERIRECTAL ABSCESS N/A 05/14/2015   Procedure: DRAINAGE PERIRECTAL ABSCESS;  Surgeon: Manus Rudd, MD;  Location: [facility];  Service: General;  Laterality: N/A;  . INCISION AND DRAINAGE PERIRECTAL ABSCESS N/A 11/09/2016   Procedure: IRRIGATION AND DEBRIDEMENT PERIRECTAL ABSCESS;  Surgeon: Glenna Fellows, MD;  Location: [facility];  Service: General;  Laterality: Vertis Kelch, MD 05/09/17 1035

## 2018-02-01 ENCOUNTER — Encounter (HOSPITAL_COMMUNITY): Payer: Self-pay

## 2018-02-01 ENCOUNTER — Emergency Department (HOSPITAL_COMMUNITY)
Admission: EM | Admit: 2018-02-01 | Discharge: 2018-02-01 | Disposition: A | Discharge status: Home / Self Care | Payer: 59 | Attending: Emergency Medicine | Admitting: Emergency Medicine

## 2018-02-01 ENCOUNTER — Emergency Department (HOSPITAL_COMMUNITY): Payer: 59

## 2018-02-01 ENCOUNTER — Other Ambulatory Visit: Payer: Self-pay

## 2018-02-01 DIAGNOSIS — R0789 Other chest pain: Secondary | ICD-10-CM

## 2018-02-01 DIAGNOSIS — F17210 Nicotine dependence, cigarettes, uncomplicated: Secondary | ICD-10-CM | POA: Diagnosis not present

## 2018-02-01 DIAGNOSIS — Z79899 Other long term (current) drug therapy: Secondary | ICD-10-CM | POA: Insufficient documentation

## 2018-02-01 DIAGNOSIS — J4521 Mild intermittent asthma with (acute) exacerbation: Secondary | ICD-10-CM | POA: Diagnosis not present

## 2018-02-01 LAB — CBC WITH DIFFERENTIAL/PLATELET
Abs Immature Granulocytes: 0 10*3/uL (ref 0.0–0.1)
Basophils Absolute: 0.1 10*3/uL (ref 0.0–0.1)
Basophils Relative: 1 %
Eosinophils Absolute: 0 10*3/uL (ref 0.0–0.7)
Eosinophils Relative: 1 %
HCT: 52.6 % — ABNORMAL HIGH (ref 39.0–52.0)
Hemoglobin: 17.8 g/dL — ABNORMAL HIGH (ref 13.0–17.0)
Immature Granulocytes: 0 %
Lymphocytes Relative: 32 %
Lymphs Abs: 2.1 10*3/uL (ref 0.7–4.0)
MCH: 29.4 pg (ref 26.0–34.0)
MCHC: 33.8 g/dL (ref 30.0–36.0)
MCV: 86.9 fL (ref 78.0–100.0)
Monocytes Absolute: 0.6 10*3/uL (ref 0.1–1.0)
Monocytes Relative: 9 %
Neutro Abs: 3.8 10*3/uL (ref 1.7–7.7)
Neutrophils Relative %: 57 %
Platelets: 194 10*3/uL (ref 150–400)
RBC: 6.05 MIL/uL — ABNORMAL HIGH (ref 4.22–5.81)
RDW: 13.9 % (ref 11.5–15.5)
WBC: 6.6 10*3/uL (ref 4.0–10.5)

## 2018-02-01 LAB — BASIC METABOLIC PANEL WITH GFR
Anion gap: 16 — ABNORMAL HIGH (ref 5–15)
BUN: 13 mg/dL (ref 6–20)
CO2: 22 mmol/L (ref 22–32)
Calcium: 9.9 mg/dL (ref 8.9–10.3)
Chloride: 98 mmol/L — ABNORMAL LOW (ref 101–111)
Creatinine, Ser: 1.36 mg/dL — ABNORMAL HIGH (ref 0.61–1.24)
GFR calc Af Amer: >60 mL/min
GFR calc non Af Amer: >60 mL/min
Glucose, Bld: 85 mg/dL (ref 65–99)
Potassium: 5 mmol/L (ref 3.5–5.1)
Sodium: 136 mmol/L (ref 135–145)

## 2018-02-01 LAB — D-DIMER, QUANTITATIVE: D-Dimer, Quant: <0.27 ug/mL-FEU (ref 0.00–0.50)

## 2018-02-01 MED ORDER — IPRATROPIUM-ALBUTEROL 0.5-2.5 (3) MG/3ML IN SOLN
3.0000 mL | Freq: Once | RESPIRATORY_TRACT | Status: AC
  Administered 2018-02-01: 3 mL via RESPIRATORY_TRACT
  Filled 2018-02-01: qty 3

## 2018-02-01 MED ORDER — METHOCARBAMOL 500 MG PO TABS
500.0000 mg | ORAL_TABLET | Freq: Every evening | ORAL | 0 refills | Status: DC | PRN
Start: 2018-02-01 — End: 2018-03-14

## 2018-02-01 MED ORDER — ALBUTEROL SULFATE HFA 108 (90 BASE) MCG/ACT IN AERS: 1.0000 | INHALATION_SPRAY | Freq: Four times a day (QID) | RESPIRATORY_TRACT | 1 refills | Status: DC | PRN

## 2018-02-01 NOTE — Discharge Instructions (Signed)
Use inhaler as needed for shortness of breath/wheezing Take Robaxin as needed for muscle spasms Drink plenty of fluids

## 2018-02-01 NOTE — ED Notes (Signed)
Patient transported to X-ray 

## 2018-02-01 NOTE — ED Notes (Signed)
Kelly PA at bedside   

## 2018-02-01 NOTE — ED Triage Notes (Signed)
Pt has asthma/bronchitis and was given an inhaler x 1 month, ran out this morning. Pt states "I need another one to help my breathing" Also states that his BP has been high, no hx.

## 2018-02-01 NOTE — ED Notes (Signed)
Patient returned from xray.

## 2018-02-01 NOTE — ED Provider Notes (Signed)
[facility] EMERGENCY DEPARTMENT Provider Note   CSN: [id number] Arrival date & time: 02/01/18  1035     History   Chief Complaint Chief Complaint  Patient presents with  . Asthma    HPI Jay Gentry is a 42 y.o. male who presents with chest tightness and shortness of breath.  Past medical history significant for asthma and is a current smoker.  He states that yesterday he was in his usual state of health.  This morning at 8 AM he felt an acute onset of chest tightness and shortness of breath.  He has had some mild wheezing as well.  He try to use his inhaler but reports that he is out of medicine.  He denies fever or recent URI.  He denies chest pain but just feels like he is having diffuse tightness. No recent surgery/travel/immobilization, hx of cancer, leg swelling, hemptysis, prior DVT/PE, or hormone use.  HPI  Past Medical History:  Diagnosis Date  . Asthma   . Bronchitis   . Perirectal abscess 05/14/2015    Patient Active Problem List   Diagnosis Date Noted  . Perirectal abscess 05/14/2015    Past Surgical History:  Procedure Laterality Date  . INCISION AND DRAINAGE PERIRECTAL ABSCESS  05/14/2015  . INCISION AND DRAINAGE PERIRECTAL ABSCESS N/A 05/14/2015   Procedure: DRAINAGE PERIRECTAL ABSCESS;  Surgeon: Manus Rudd, MD;  Location: [facility] OR;  Service: General;  Laterality: N/A;  . INCISION AND DRAINAGE PERIRECTAL ABSCESS N/A 11/09/2016   Procedure: IRRIGATION AND DEBRIDEMENT PERIRECTAL ABSCESS;  Surgeon: Glenna Fellows, MD;  Location: [facility]S;  Service: General;  Laterality: N/A;        Home Medications    Prior to Admission medications   Medication Sig Start Date End Date Taking? Authorizing Provider  acetaminophen (TYLENOL) 325 MG tablet You can take plain Tylenol, 2 tablets every 6 hours as needed for pain. 11/09/16   Sherrie George, PA-C  albuterol (PROVENTIL HFA;VENTOLIN HFA) 108 (90 Base) MCG/ACT inhaler Inhale 1-2 puffs into the  lungs every 4 (four) hours as needed for wheezing. 05/09/17   Mardella Layman, MD  albuterol (PROVENTIL) (2.5 MG/3ML) 0.083% nebulizer solution Take 3 mLs (2.5 mg total) by nebulization every 4 (four) hours as needed for wheezing or shortness of breath. Patient not taking: Reported on 11/08/2016 04/28/14   Autumn Messing H, PA-C  BREO ELLIPTA 100-25 MCG/INH AEPB Take 1 puff by mouth daily. 09/23/16   [provider]  ciprofloxacin (CIPRO) 500 MG tablet Take 1 tablet (500 mg total) by mouth 2 (two) times daily. 11/09/16   Sherrie George, PA-C  metroNIDAZOLE (FLAGYL) 500 MG tablet Take 1 tablet (500 mg total) by mouth every 8 (eight) hours. 11/09/16   Sherrie George, PA-C  nicotine (NICODERM CQ - DOSED IN MG/24 HOURS) 14 mg/24hr patch Place 1 patch (14 mg total) onto the skin daily. Patient not taking: Reported on 11/06/2016 05/15/15   [name], PA-C  oxyCODONE (OXY IR/ROXICODONE) 5 MG immediate release tablet Take 1-2 tablets (5-10 mg total) by mouth every 4 (four) hours as needed for moderate pain. 11/09/16   Sherrie George, PA-C    Family History History reviewed. No pertinent family history.  Social History Social History   Tobacco Use  . Smoking status: Current Every Day Smoker    Packs/day: 0.50    Years: 18.00    Pack years: 9.00    Types: Cigarettes  . Smokeless tobacco: Never Used  Substance Use Topics [facility]. Alcohol use:  Yes    Comment: occassional  . Drug use: No     Allergies   Penicillins   Review of Systems Review of Systems  Constitutional: Negative for fever.  HENT: Negative for congestion.   Respiratory: Positive for shortness of breath and wheezing. Negative for cough.   Cardiovascular: Negative for chest pain and leg swelling.     Physical Exam Updated Vital Signs BP (!) 157/105 (BP Location: Left Wrist)   Pulse (!) 103   Temp 98.9 F (37.2 C) (Oral)   Resp 20   Ht  (1.727 m)   Wt 80.7 kg (178 lb)   SpO2 98%   BMI 27.06 kg/m    Physical Exam  Constitutional: He is oriented to person, place, and time. He appears well-developed and well-nourished. No distress.  Calm and cooperative  HENT:  Head: Normocephalic and atraumatic.  Right Ear: Tympanic membrane normal.  Left Ear: Tympanic membrane normal.  Nose: Nose normal.  Mouth/Throat: Uvula is midline, oropharynx is clear and moist and mucous membranes are normal.  Eyes: Pupils are equal, round, and reactive to light. Conjunctivae are normal. Right eye exhibits no discharge. Left eye exhibits no discharge. No scleral icterus.  Neck: Normal range of motion.  Cardiovascular: Normal rate and regular rhythm.  Pulmonary/Chest: Effort normal. No respiratory distress. He has wheezes (Mild expiratory wheezes at the bases).  Abdominal: He exhibits no distension.  Musculoskeletal:  No calf tenderness or swelling  Neurological: He is alert and oriented to person, place, and time.  Skin: Skin is warm and dry.  Psychiatric: He has a normal mood and affect. His behavior is normal.  Nursing note and vitals reviewed.    ED Treatments / Results  Labs (all labs ordered are listed, but only abnormal results are displayed) Labs Reviewed - No data to display  EKG None  Radiology No results found.  Procedures Procedures (including critical care time)  Medications Ordered in ED Medications  ipratropium-albuterol (DUONEB) 0.5-2.5 (3) MG/3ML nebulizer solution 3 mL (3 mLs Nebulization Given 02/01/18 1203)     Initial Impression / Assessment and Plan / ED Course  I have reviewed the triage vital signs and the nursing notes.  Pertinent labs & imaging results that were available during my care of the patient were reviewed by me and considered in my medical decision making (see chart for details).  42 year old male presents with chest tightness, SOB, wheezing. He is out of his inhaler. He is tachycardic and hypertensive. Otherwise vitals are normal. On exam his HR is  elevated but regular. He has mild wheezes. Will order breathing tx, EKG, CXR.  12:51 PM Pt reports his breathing feels better after a breathing tx however he still is having vague chest pain and "spasms" that radiates to the back. EKG is SR and CXR is clear. He is technically not PERC negative since his HR is elevated. Will obtain blood work, d-dimer.   CBC is remarkable for elevated hemoglobin.  BMP is remarkable for elevated serum creatinine and anion gap of 16.  GFR is normal.  Likely mildly dehydrated.  His d-dimer is negative.  Discussed results with the patient.  He is advised to hydrate and to use Robaxin as needed for spasms.  He was given a prescription for albuterol.  He is advised to establish care with a primary doctor.   Final Clinical Impressions(s) / ED Diagnoses   Final diagnoses:  Mild intermittent asthma with acute exacerbation  Other chest pain  ED Discharge Orders    None       Beryle Quant 02/01/18 1604    Arby Barrette, MD 02/05/18 [phone]

## 2018-03-14 ENCOUNTER — Encounter (HOSPITAL_COMMUNITY): Payer: Self-pay | Admitting: Emergency Medicine

## 2018-03-14 ENCOUNTER — Other Ambulatory Visit: Payer: Self-pay

## 2018-03-14 ENCOUNTER — Ambulatory Visit (HOSPITAL_COMMUNITY)
Admission: EM | Admit: 2018-03-14 | Discharge: 2018-03-14 | Disposition: A | Discharge status: Home / Self Care | Payer: 59 | Attending: Family Medicine | Admitting: Family Medicine

## 2018-03-14 DIAGNOSIS — R0789 Other chest pain: Secondary | ICD-10-CM | POA: Diagnosis not present

## 2018-03-14 MED ORDER — METHOCARBAMOL 500 MG PO TABS: 500.0000 mg | ORAL_TABLET | Freq: Two times a day (BID) | ORAL | 0 refills | Status: DC | PRN

## 2018-03-14 MED ORDER — ALBUTEROL SULFATE HFA 108 (90 BASE) MCG/ACT IN AERS: 1.0000 | INHALATION_SPRAY | Freq: Four times a day (QID) | RESPIRATORY_TRACT | 0 refills | Status: DC | PRN

## 2018-03-14 MED ORDER — BREO ELLIPTA 100-25 MCG/INH IN AEPB: 1.0000 | INHALATION_SPRAY | Freq: Every day | RESPIRATORY_TRACT | 0 refills | Status: DC

## 2018-03-14 NOTE — ED Triage Notes (Signed)
[facility] a [facility] of an asthma flair up that has resulted in some "tightness". The patient reported that he was out of his rescue inhaler.

## 2018-03-14 NOTE — ED Provider Notes (Signed)
[facility]    CSN: [id number] Arrival date & time: 03/14/18  1102     History   Chief Complaint Chief Complaint  Patient presents with  . Asthma    HPI Jay Gentry is a 42 y.o. male.   42 year old mae with history of asthma comes in for 1 day history of chest tightness. States he ran out of breo and albuterol yesterday and woke up this morning with tightness. States he used albuterol nebulizer this morning with mild relief. He feels as if he has some muscle spasms of the chest. Does go to the gym, and states has new born at home he is now holding, otherwise no significant increase in activity. He denies chest pain, wheezing. States feels he needs to breath deeper to get air. Denies URI symptoms such as cough, congestion, sore throat. Denies fever, chills, night sweats. Continues to be current everyday smoker.      Past Medical History:  Diagnosis Date  . Asthma   . Bronchitis   . Perirectal abscess 05/14/2015    Patient Active Problem List   Diagnosis Date Noted  . Perirectal abscess 05/14/2015    Past Surgical History:  Procedure Laterality Date  . INCISION AND DRAINAGE PERIRECTAL ABSCESS  05/14/2015  . INCISION AND DRAINAGE PERIRECTAL ABSCESS N/A 05/14/2015   Procedure: DRAINAGE PERIRECTAL ABSCESS;  Surgeon: Manus Rudd, MD;  Location: [facility] OR;  Service: General;  Laterality: N/A;  . INCISION AND DRAINAGE PERIRECTAL ABSCESS N/A 11/09/2016   Procedure: IRRIGATION AND DEBRIDEMENT PERIRECTAL ABSCESS;  Surgeon: Glenna Fellows, MD;  Location: [facility];  Service: General;  Laterality: N/A;       Home Medications    Prior to Admission medications   Medication Sig Start Date End Date Taking? Authorizing Provider  acetaminophen (TYLENOL) 325 MG tablet You can take plain Tylenol, 2 tablets every 6 hours as needed for pain. 11/09/16  Yes Sherrie George, PA-C  albuterol (PROVENTIL) (2.5 MG/3ML) 0.083% nebulizer solution Take 3 mLs (2.5 mg total) by  nebulization every 4 (four) hours as needed for wheezing or shortness of breath. 04/28/14  Yes Baker, Zachary H, PA-C  albuterol (PROVENTIL HFA;VENTOLIN HFA) 108 (90 Base) MCG/ACT inhaler Inhale 1-2 puffs into the lungs every 6 (six) hours as needed for wheezing or shortness of breath. 03/14/18   Avanell Banwart V, PA-C  BREO ELLIPTA 100-25 MCG/INH AEPB Inhale 1 puff into the lungs daily. 03/14/18   Cathie Hoops, Casy Brunetto V, PA-C  methocarbamol (ROBAXIN) 500 MG tablet Take 1 tablet (500 mg total) by mouth 2 (two) times daily as needed for muscle spasms. 03/14/18   Belinda Fisher, PA-C    Family History History reviewed. No pertinent family history.  Social History Social History   Tobacco Use  . Smoking status: Current Every Day Smoker    Packs/day: 0.50    Years: 18.00    Pack years: 9.00    Types: Cigarettes  . Smokeless tobacco: Never Used  Substance Use Topics  . Alcohol use: Yes    Comment: occassional  . Drug use: No     Allergies   Penicillins   Review of Systems Review of Systems  Reason unable to perform ROS: See HPI as above.     Physical Exam Triage Vital Signs ED Triage Vitals  Enc Vitals Group     BP 03/14/18 1113 (!) 156/113     Pulse Rate 03/14/18 1113 90     Resp 03/14/18 1113 18     Temp  03/14/18 1113 98.2 F (36.8 C)     Temp Source 03/14/18 1113 Oral     SpO2 03/14/18 1113 100 %     Weight --      Height --      Head Circumference --      Peak Flow --      Pain Score 03/14/18 1114 6     Pain Loc --      Pain Edu? --      Excl. in GC? --    No data found.  Updated Vital Signs BP (!) 156/113 (BP Location: Left Arm)   Pulse 90   Temp 98.2 F (36.8 C) (Oral)   Resp 18   SpO2 100%   Physical Exam  Constitutional: He is oriented to person, place, and time. He appears well-developed and well-nourished. No distress.  Speaking in full sentences without problems.  HENT:  Head: Normocephalic and atraumatic.  Right Ear: Tympanic membrane, external ear and ear canal normal.  Tympanic membrane is not erythematous and not bulging.  Left Ear: Tympanic membrane, external ear and ear canal normal. Tympanic membrane is not erythematous and not bulging.  Nose: Nose normal. Right sinus exhibits no maxillary sinus tenderness and no frontal sinus tenderness. Left sinus exhibits no maxillary sinus tenderness and no frontal sinus tenderness.  Mouth/Throat: Uvula is midline, oropharynx is clear and moist and mucous membranes are normal.  Eyes: Pupils are equal, round, and reactive to light. Conjunctivae are normal.  Neck: Normal range of motion. Neck supple.  Cardiovascular: Normal rate, regular rhythm and normal heart sounds. Exam reveals no gallop and no friction rub.  No murmur heard. Pulmonary/Chest: Effort normal and breath sounds normal. No stridor. No respiratory distress. He has no decreased breath sounds. He has no wheezes. He has no rhonchi. He has no rales. He exhibits tenderness (sternal and left chest).  Lymphadenopathy:    He has no cervical adenopathy.  Neurological: He is alert and oriented to person, place, and time.  Skin: Skin is warm and dry. He is not diaphoretic.  Psychiatric: He has a normal mood and affect. His behavior is normal. Judgment normal.     UC Treatments / Results  Labs (all labs ordered are listed, but only abnormal results are displayed) Labs Reviewed - No data to display  EKG None  Radiology No results found.  Procedures Procedures (including critical care time)  Medications Ordered in UC Medications - No data to display  Initial Impression / Assessment and Plan / UC Course  I have reviewed the triage vital signs and the nursing notes.  Pertinent labs & imaging results that were available during my care of the patient were reviewed by me and considered in my medical decision making (see chart for details).    Discussed with patient lungs are clear to auscultation bilaterally without adventitious lung sounds. Would like  further workup such as duoneb, EKG, possibly chest xray to assess for cause of symptoms. Patient declined further workup and would like refill on medicines. States had similar symptoms at the ED and was given some muscle relaxant with good improvement. Discussed risks of not having full workup, including worsening of symptoms, danger of life. Patient expresses understanding and would like to defer evaluation at this time. Agrees to immediate follow up if symptoms does not improve. Albuterol and Breo refilled. Robaxin as needed. Strict return precautions given.  Final Clinical Impressions(s) / UC Diagnoses   Final diagnoses:  Chest tightness    ED  Prescriptions    Medication Sig Dispense Auth. Provider   albuterol (PROVENTIL HFA;VENTOLIN HFA) 108 (90 Base) MCG/ACT inhaler Inhale 1-2 puffs into the lungs every 6 (six) hours as needed for wheezing or shortness of breath. 1 Inhaler Renada Cronin V, PA-C   BREO ELLIPTA 100-25 MCG/INH AEPB Inhale 1 puff into the lungs daily. 60 each Zuleyma Scharf V, PA-C   methocarbamol (ROBAXIN) 500 MG tablet Take 1 tablet (500 mg total) by mouth 2 (two) times daily as needed for muscle spasms. 10 tablet Threasa AlphaYu, Jia Dottavio V, PA-C        Hiawatha Dressel V, PA-C 03/14/18 1158

## 2018-03-14 NOTE — Discharge Instructions (Addendum)
As discussed, your lung exam is normal today, and there may be other causes of your symptoms. I have refilled your albuterol and breo, please restart. Robaxin as needed for possible muscle spasm causing symptoms. As discussed, given we did not do further evaluation as per your preference, if experiencing worsening symptoms, chest pain, shortness of breath, weakness, dizziness, please follow up for reevaluation needed.

## 2018-05-04 ENCOUNTER — Encounter: Payer: Self-pay | Admitting: Nurse Practitioner

## 2018-05-04 ENCOUNTER — Other Ambulatory Visit (INDEPENDENT_AMBULATORY_CARE_PROVIDER_SITE_OTHER): Payer: 59

## 2018-05-04 ENCOUNTER — Ambulatory Visit (INDEPENDENT_AMBULATORY_CARE_PROVIDER_SITE_OTHER): Payer: 59 | Admitting: Nurse Practitioner

## 2018-05-04 VITALS — BP 140/98 | HR 88 | Ht 68.0 in | Wt 186.0 lb

## 2018-05-04 DIAGNOSIS — J4540 Moderate persistent asthma, uncomplicated: Secondary | ICD-10-CM

## 2018-05-04 DIAGNOSIS — I159 Secondary hypertension, unspecified: Secondary | ICD-10-CM | POA: Diagnosis not present

## 2018-05-04 DIAGNOSIS — I10 Essential (primary) hypertension: Secondary | ICD-10-CM | POA: Insufficient documentation

## 2018-05-04 LAB — CBC
HCT: 47.8 % (ref 39.0–52.0)
Hemoglobin: 16.4 g/dL (ref 13.0–17.0)
MCHC: 34.3 g/dL (ref 30.0–36.0)
MCV: 88.2 fl (ref 78.0–100.0)
Platelets: 210 10*3/uL (ref 150.0–400.0)
RBC: 5.42 Mil/uL (ref 4.22–5.81)
RDW: 13.4 % (ref 11.5–15.5)
WBC: 5.6 10*3/uL (ref 4.0–10.5)

## 2018-05-04 LAB — COMPREHENSIVE METABOLIC PANEL
ALT: 52 U/L (ref 0–53)
AST: 40 U/L — ABNORMAL HIGH (ref 0–37)
Albumin: 4.6 g/dL (ref 3.5–5.2)
Alkaline Phosphatase: 45 U/L (ref 39–117)
BUN: 13 mg/dL (ref 6–23)
CO2: 29 mEq/L (ref 19–32)
Calcium: 10 mg/dL (ref 8.4–10.5)
Chloride: 99 mEq/L (ref 96–112)
Creatinine, Ser: 1.42 mg/dL (ref 0.40–1.50)
GFR: 70.38 mL/min (ref >60.00)
Glucose, Bld: 130 mg/dL — ABNORMAL HIGH (ref 70–99)
Potassium: 3.8 mEq/L (ref 3.5–5.1)
Sodium: 137 mEq/L (ref 135–145)
Total Bilirubin: 0.8 mg/dL (ref 0.2–1.2)
Total Protein: 7.4 g/dL (ref 6.0–8.3)

## 2018-05-04 LAB — TSH: TSH: 1.44 u[IU]/mL (ref 0.35–4.50)

## 2018-05-04 MED ORDER — AMLODIPINE BESYLATE 5 MG PO TABS: 5.0000 mg | ORAL_TABLET | Freq: Every day | ORAL | 3 refills | Status: DC

## 2018-05-04 MED ORDER — TIOTROPIUM BROMIDE MONOHYDRATE 2.5 MCG/ACT IN AERS: 2.0000 | INHALATION_SPRAY | Freq: Every day | RESPIRATORY_TRACT | 3 refills | Status: DC

## 2018-05-04 NOTE — Progress Notes (Signed)
Name: Jay Gentry   MRN: [id number]    DOB: 1976/05/30   Date:05/04/2018       Progress Note  Subjective  Chief Complaint  Establish care  HPI [name] is here today to establish care as a new patient to our practice, aside from primary care, he has not routinely been following with any primary care provider. He does not see any specialists. His daily maintenance medications include inhalers for asthma, which we will review today. We will also discuss his elevated blood pressure. He says he was diagnosed with asthma several years ago and has been maintained on different inhalers since. His asthma was best controlled with breo but it is not affordable for him. 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 He is currently only using his albuterol to maintain his asthma, which he reports using about 4 times per day, often feeling short of breath  And tight throughout the day and wakes at night sometimes feeling short of breath. His blood pressure is quite elevated today as well as on past documented clinical readings, he says hes not been maintained on BP medications in the past. He experiences occasional cough, blurred vision, and headaches. He denies fevers, chills, confusion, weakness, syncope, chest pain, edema, nausea, vomiting.  BP Readings from Last 3 Encounters:  05/04/18 (!) 140/98  03/14/18 (!) 156/113  02/01/18 (!) 172/117     Patient Active Problem List   Diagnosis Date Noted  . Perirectal abscess 05/14/2015    Past Surgical History:  Procedure Laterality Date  . INCISION AND DRAINAGE PERIRECTAL ABSCESS  05/14/2015  . INCISION AND DRAINAGE PERIRECTAL ABSCESS N/A 05/14/2015   Procedure: DRAINAGE PERIRECTAL ABSCESS;  Surgeon: Manus Rudd, MD;   Location: [facility];  Service: General;  Laterality: N/A;  . INCISION AND DRAINAGE PERIRECTAL ABSCESS N/A 11/09/2016   Procedure: IRRIGATION AND DEBRIDEMENT PERIRECTAL ABSCESS;  Surgeon: Glenna Fellows, MD;  Location: [facility];  Service: General;  Laterality: N/A;    History reviewed. No pertinent family history.  Social History   Socioeconomic History  . Marital status: Married    Spouse name: Not on file  . Number of children: Not on file  . Years of education: Not on file  . Highest education level: Not on file  Occupational History  . Not on file  Social Needs  . Financial resource strain: Not on file  . Food insecurity:    Worry: Not on file    Inability: Not on file  . Transportation needs:    Medical: Not on file    Non-medical: Not on file  Tobacco Use  . Smoking status: Current Every Day Smoker    Packs/day: 0.50    Years: 18.00    Pack years: 9.00    Types: Cigarettes  . Smokeless tobacco: Never Used  Substance and Sexual Activity  . Alcohol use: Yes    Comment: occassional  . Drug use: No  . Sexual activity: Not on file  Lifestyle  . Physical activity:    Days per week: Not on file    Minutes per session: Not on file  . Stress: Not on file  Relationships  . Social connections:    Talks on phone: Not on file  Gets together: Not on file    Attends religious service: Not on file    Active member of club or organization: Not on file    Attends meetings of clubs or organizations: Not on file    Relationship status: Not on file  . Intimate partner violence:    Fear of current or ex partner: Not on file    Emotionally abused: Not on file    Physically abused: Not on file    Forced sexual activity: Not on file  Other Topics Concern  . Not on file  Social History Narrative  . Not on file     Current Outpatient Medications:  .  acetaminophen (TYLENOL) 325 MG tablet, You can take plain Tylenol, 2 tablets every 6 hours as needed for pain., Disp: , Rfl:  .   albuterol (PROVENTIL HFA;VENTOLIN HFA) 108 (90 Base) MCG/ACT inhaler, Inhale 1-2 puffs into the lungs every 6 (six) hours as needed for wheezing or shortness of breath., Disp: 1 Inhaler, Rfl: 0 .  albuterol (PROVENTIL) (2.5 MG/3ML) 0.083% nebulizer solution, Take 3 mLs (2.5 mg total) by nebulization every 4 (four) hours as needed for wheezing or shortness of breath., Disp: 75 mL, Rfl: 1 .  BREO ELLIPTA 100-25 MCG/INH AEPB, Inhale 1 puff into the lungs daily., Disp: 60 each, Rfl: 0 .  methocarbamol (ROBAXIN) 500 MG tablet, Take 1 tablet (500 mg total) by mouth 2 (two) times daily as needed for muscle spasms., Disp: 10 tablet, Rfl: 0 .  amLODipine (NORVASC) 5 MG tablet, Take 1 tablet (5 mg total) by mouth daily., Disp: 90 tablet, Rfl: 3 .  Tiotropium Bromide Monohydrate (SPIRIVA RESPIMAT) 2.5 MCG/ACT AERS, Inhale 2 puffs into the lungs daily., Disp: 1 Inhaler, Rfl: 3  Allergies  Allergen Reactions  . Penicillins Hives    Has patient had a PCN reaction causing immediate rash, facial/tongue/throat swelling, SOB or lightheadedness with hypotension: yes Has patient had a PCN reaction causing severe rash involving mucus membranes or skin necrosis: no Has patient had a PCN reaction that required hospitalization no Has patient had a PCN reaction occurring within the last 10 years: no If all of the above answers are "NO", then may proceed with Cephalosporin use.      ROS See HPI  Objective  Vitals:   05/04/18 0918  BP: (!) 140/98  Pulse: 88  SpO2: 97%  Weight: 186 lb (84.4 kg)  Height: 5\' 8"  (1.727 m)    Body mass index is 28.28 kg/m.  Physical Exam Vital signs reviewed. Constitutional: Patient appears well-developed and well-nourished. No distress.  HENT: Head: Normocephalic and atraumatic. Nose: Nose normal. Mouth/Throat: Oropharynx is clear and moist. No oropharyngeal exudate.  Eyes: Conjunctivae and EOM are normal. Pupils are equal, round, and reactive to light. No scleral icterus.   Neck: Normal range of motion. Neck supple. No thyromegaly present.  Cardiovascular: Normal rate, regular rhythm and normal heart sounds.  No murmur heard. No BLE edema. Distal pulses intact. Pulmonary/Chest: Effort normal and breath sounds normal. No respiratory distress. Musculoskeletal: Normal range of motion, No gross deformities Neurological: He is alert and oriented to person, place, and time. No cranial nerve deficit. Coordination, balance, strength, speech and gait are normal.  Skin: Skin is warm and dry. No rash noted. No erythema.  Psychiatric: Patient has a normal mood and affect. behavior is normal. Judgment and thought content normal.    Assessment & Plan RTC in 2 weeks for F/U: HTN- starting amlodipine; Asthma- starting spiriva

## 2018-05-04 NOTE — Assessment & Plan Note (Signed)
Discussed the risks of long term uncontrolled HTN and recommended we initiate medication to reduce his BP below goal <140/90 and he is agreeable Amlodpine 5 sent - dosing and side effects discussed Labs orderd today Role of healthy diet and exercise in the management of HTN reviewed and additional information provided on AVS RTC in 2 weeks for F/U - recheck BP - CBC; Future - Comprehensive metabolic panel; Future - TSH; Future - amLODipine (NORVASC) 5 MG tablet; Take 1 tablet (5 mg total) by mouth daily.  Dispense: 90 tablet; Refill: 3

## 2018-05-04 NOTE — Patient Instructions (Addendum)
Please start spiriva 2 puffs daily Our goal is to get your albuterol use down to only as needed, <2 times per week   Please start amlodipine 5 mg daily for your blood pressure  Please come back to see me in about 2 weeks for follow up, I want to see how your asthma and blood pressure are doing   DASH Eating Plan DASH stands for "Dietary Approaches to Stop Hypertension." The DASH eating plan is a healthy eating plan that has been shown to reduce high blood pressure (hypertension). It may also reduce your risk for type 2 diabetes, heart disease, and stroke. The DASH eating plan may also help with weight loss. What are tips for following this plan? General guidelines  Avoid eating more than 2,300 mg (milligrams) of salt (sodium) a day. If you have hypertension, you may need to reduce your sodium intake to 1,500 mg a day.  Limit alcohol intake to no more than 1 drink a day for nonpregnant women and 2 drinks a day for men. One drink equals 12 oz of beer, 5 oz of wine, or 1 oz of hard liquor.  Work with your health care provider to maintain a healthy body weight or to lose weight. Ask what an ideal weight is for you.  Get at least 30 minutes of exercise that causes your heart to beat faster (aerobic exercise) most days of the week. Activities may include walking, swimming, or biking.  Work with your health care provider or diet and nutrition specialist (dietitian) to adjust your eating plan to your individual calorie needs. Reading food labels  Check food labels for the amount of sodium per serving. Choose foods with less than 5 percent of the Daily Value of sodium. Generally, foods with less than 300 mg of sodium per serving fit into this eating plan.  To find whole grains, look for the word "whole" as the first word in the ingredient list. Shopping  Buy products labeled as "low-sodium" or "no salt added."  Buy fresh foods. Avoid canned foods and premade or frozen meals. Cooking  Avoid  adding salt when cooking. Use salt-free seasonings or herbs instead of table salt or sea salt. Check with your health care provider or pharmacist before using salt substitutes.  Do not fry foods. Cook foods using healthy methods such as baking, boiling, grilling, and broiling instead.  Cook with heart-healthy oils, such as olive, canola, soybean, or sunflower oil. Meal planning   Eat a balanced diet that includes: ? 5 or more servings of fruits and vegetables each day. At each meal, try to fill half of your plate with fruits and vegetables. ? Up to 6-8 servings of whole grains each day. ? Less than 6 oz of lean meat, poultry, or fish each day. A 3-oz serving of meat is about the same size as a deck of cards. One egg equals 1 oz. ? 2 servings of low-fat dairy each day. ? A serving of nuts, seeds, or beans 5 times each week. ? Heart-healthy fats. Healthy fats called Omega-3 fatty acids are found in foods such as flaxseeds and coldwater fish, like sardines, salmon, and mackerel.  Limit how much you eat of the following: ? Canned or prepackaged foods. ? Food that is high in trans fat, such as fried foods. ? Food that is high in saturated fat, such as fatty meat. ? Sweets, desserts, sugary drinks, and other foods with added sugar. ? Full-fat dairy products.  Do not salt foods before  eating.  Try to eat at least 2 vegetarian meals each week.  Eat more home-cooked food and less restaurant, buffet, and fast food.  When eating at a restaurant, ask that your food be prepared with less salt or no salt, if possible. What foods are recommended? The items listed may not be a complete list. Talk with your dietitian about what dietary choices are best for you. Grains Whole-grain or whole-wheat bread. Whole-grain or whole-wheat pasta. Brown rice. [place]. Bulgur. Whole-grain and low-sodium cereals. Pita bread. Low-fat, low-sodium crackers. Whole-wheat flour tortillas. Vegetables Fresh or  frozen vegetables (raw, steamed, roasted, or grilled). Low-sodium or reduced-sodium tomato and vegetable juice. Low-sodium or reduced-sodium tomato sauce and tomato paste. Low-sodium or reduced-sodium canned vegetables. Fruits All fresh, dried, or frozen fruit. Canned fruit in natural juice (without added sugar). Meat and other protein foods Skinless chicken or [place]. Ground chicken or [place]. Pork with fat trimmed off. Fish and seafood. Egg whites. Dried beans, peas, or lentils. Unsalted nuts, nut butters, and seeds. Unsalted canned beans. Lean cuts of beef with fat trimmed off. Low-sodium, lean deli meat. Dairy Low-fat (1%) or fat-free (skim) milk. Fat-free, low-fat, or reduced-fat cheeses. Nonfat, low-sodium ricotta or cottage cheese. Low-fat or nonfat yogurt. Low-fat, low-sodium cheese. Fats and oils Soft margarine without trans fats. Vegetable oil. Low-fat, reduced-fat, or light mayonnaise and salad dressings (reduced-sodium). Canola, safflower, olive, soybean, and sunflower oils. Avocado. Seasoning and other foods Herbs. Spices. Seasoning mixes without salt. Unsalted popcorn and pretzels. Fat-free sweets. What foods are not recommended? The items listed may not be a complete list. Talk with your dietitian about what dietary choices are best for you. Grains Baked goods made with fat, such as croissants, muffins, or some breads. Dry pasta or rice meal packs. Vegetables Creamed or fried vegetables. Vegetables in a cheese sauce. Regular canned vegetables (not low-sodium or reduced-sodium). Regular canned tomato sauce and paste (not low-sodium or reduced-sodium). Regular tomato and vegetable juice (not low-sodium or reduced-sodium). Angie Fava. Olives. Fruits Canned fruit in a light or heavy syrup. Fried fruit. Fruit in cream or butter sauce. Meat and other protein foods Fatty cuts of meat. Ribs. Fried meat. Berniece Salines. Sausage. Bologna and other processed lunch meats. Salami. Fatback. Hotdogs.  Bratwurst. Salted nuts and seeds. Canned beans with added salt. Canned or smoked fish. Whole eggs or egg yolks. Chicken or [place] with skin. Dairy Whole or 2% milk, cream, and half-and-half. Whole or full-fat cream cheese. Whole-fat or sweetened yogurt. Full-fat cheese. Nondairy creamers. Whipped toppings. Processed cheese and cheese spreads. Fats and oils Butter. Stick margarine. Lard. Shortening. Ghee. Bacon fat. Tropical oils, such as coconut, palm kernel, or palm oil. Seasoning and other foods Salted popcorn and pretzels. Onion salt, garlic salt, seasoned salt, table salt, and sea salt. Worcestershire sauce. Tartar sauce. Barbecue sauce. Teriyaki sauce. Soy sauce, including reduced-sodium. Steak sauce. Canned and packaged gravies. Fish sauce. Oyster sauce. Cocktail sauce. Horseradish that you find on the shelf. Ketchup. Mustard. Meat flavorings and tenderizers. Bouillon cubes. Hot sauce and Tabasco sauce. Premade or packaged marinades. Premade or packaged taco seasonings. Relishes. Regular salad dressings. Where to find more information:  National Heart, Lung, and [place]: https://wilson-eaton.com/  American Heart Association: www.heart.org Summary  The DASH eating plan is a healthy eating plan that has been shown to reduce high blood pressure (hypertension). It may also reduce your risk for type 2 diabetes, heart disease, and stroke.  With the DASH eating plan, you should limit salt (sodium) intake to 2,300 mg a  day. If you have hypertension, you may need to reduce your sodium intake to 1,500 mg a day.  When on the DASH eating plan, aim to eat more fresh fruits and vegetables, whole grains, lean proteins, low-fat dairy, and heart-healthy fats.  Work with your health care provider or diet and nutrition specialist (dietitian) to adjust your eating plan to your individual calorie needs. This information is not intended to replace advice given to you by your health care provider. Make sure you  discuss any questions you have with your health care provider. Document Released: 08/19/2011 Document Revised: 08/23/2016 Document Reviewed: 08/23/2016 Elsevier Interactive Patient Education  [employer].

## 2018-05-04 NOTE — Assessment & Plan Note (Addendum)
Asthma is poorly controlled with difficulty affording medications We have sample and coupon card for spiriva in the office, will trial spiriva to see if this improves his asthma- sample and coupon card given- dosing and side effects discussed Discussed asthma goals Encouraged to keep up the good work on quitting smoking RTC in about 2 weeks for F/U - Tiotropium Bromide Monohydrate (SPIRIVA RESPIMAT) 2.5 MCG/ACT AERS; Inhale 2 puffs into the lungs daily.  Dispense: 1 Inhaler; Refill: 3

## 2018-05-12 ENCOUNTER — Telehealth: Payer: Self-pay | Admitting: Nurse Practitioner

## 2018-05-12 MED ORDER — ALBUTEROL SULFATE HFA 108 (90 BASE) MCG/ACT IN AERS: 1.0000 | INHALATION_SPRAY | Freq: Four times a day (QID) | RESPIRATORY_TRACT | 1 refills | Status: DC | PRN

## 2018-05-12 MED ORDER — BREO ELLIPTA 100-25 MCG/INH IN AEPB
1.0000 | INHALATION_SPRAY | Freq: Every day | RESPIRATORY_TRACT | 0 refills | Status: DC
Start: 2018-05-12 — End: 2018-07-25

## 2018-05-12 NOTE — Telephone Encounter (Signed)
Per Ria ClockLaura Murray, NP, I informed pt Spiriva respimat needs to be d/c'd for now. I have 1 sample of Breo 100/25 for patient upfront and I have refilled his Albuterol. I offered him a OV today with any available providers and he declines. He states he just needs something different for the Spiriva respimat. I advised him to keep ROV with Ashleigh on 05/22/18.

## 2018-05-12 NOTE — Telephone Encounter (Signed)
Copied from CRM [id number]. Topic: Quick Communication - Rx Refill/Question >> May 12, 2018 12:22 PM Alexander BergeronBarksdale, Harvey B wrote: Medication: Tiotropium Bromide Monohydrate (SPIRIVA RESPIMAT) 2.5 MCG/ACT AERS [[id number]][[id number]]   Pt called to state the medication is not working for him; pt is asking for a stronger dose or if he can switch to albuterol; pt states after a treatment of the medication above after 30 mins to an Hr he has tightness of chest and hard to breathe contact pt to advise

## 2018-05-22 ENCOUNTER — Ambulatory Visit (INDEPENDENT_AMBULATORY_CARE_PROVIDER_SITE_OTHER): Payer: 59 | Admitting: Nurse Practitioner

## 2018-05-22 ENCOUNTER — Other Ambulatory Visit (INDEPENDENT_AMBULATORY_CARE_PROVIDER_SITE_OTHER): Payer: 59

## 2018-05-22 ENCOUNTER — Encounter: Payer: Self-pay | Admitting: Nurse Practitioner

## 2018-05-22 VITALS — BP 138/88 | HR 84 | Ht 68.0 in | Wt 185.0 lb

## 2018-05-22 DIAGNOSIS — R7309 Other abnormal glucose: Secondary | ICD-10-CM

## 2018-05-22 DIAGNOSIS — F17219 Nicotine dependence, cigarettes, with unspecified nicotine-induced disorders: Secondary | ICD-10-CM | POA: Diagnosis not present

## 2018-05-22 DIAGNOSIS — J4540 Moderate persistent asthma, uncomplicated: Secondary | ICD-10-CM | POA: Diagnosis not present

## 2018-05-22 DIAGNOSIS — I10 Essential (primary) hypertension: Secondary | ICD-10-CM

## 2018-05-22 LAB — HEMOGLOBIN A1C: Hgb A1c MFr Bld: 6 % (ref 4.6–6.5)

## 2018-05-22 NOTE — Assessment & Plan Note (Signed)
BP has improved on amlodipine, still slightly elevated Continue amlodipine at current dosage for now RTC in 1 month for F/U- repeat BP reading

## 2018-05-22 NOTE — Progress Notes (Signed)
Name: Jay Gentry   MRN: [id number]    DOB: 1975-12-25   Date:05/22/2018       Progress Note  Subjective  Chief Complaint Follow up  HPI Jay Gentry is here today for follow up of hypertension and asthma, last discussed at his recent OV on 05/04/18. We will also follow up on elevated glucose reading noted on 05/04/18 labs.  Hypertension -started on amlodipine 5 daily at his last OV for high BP readings Reports daily medication compliance without noted adverse medication effects. Reports he does not routinely check his BP readings Denies headaches, vision changes, chest pain,edema.  BP Readings from Last 3 Encounters:  05/22/18 138/88  05/04/18 (!) 140/98  03/14/18 (!) 156/113   Asthma-  At his last OV on 8/22, he told me he was off breo due to cost and only maintained on albuterol which he was taking about 4 times per day. We decided to try adding daily spiriva, He started the Spiriva as instructed but then he called in to the office on 8/30 saying spiriva made him feel more SOB. 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 He has now paid his insurance deductible for the year and thinks breo will be affordable for him for the rest of this year He is still working on quitting smoking, down to 1 cigarette about every 3 days.   Patient Active Problem List   Diagnosis Date Noted  . Essential hypertension 05/04/2018  . Moderate persistent asthma without complication 05/04/2018  . Perirectal abscess 05/14/2015    Past Surgical History:  Procedure Laterality Date  . INCISION AND DRAINAGE PERIRECTAL ABSCESS  05/14/2015  . INCISION AND DRAINAGE PERIRECTAL ABSCESS N/A 05/14/2015   Procedure: DRAINAGE PERIRECTAL ABSCESS;  Surgeon: Manus Rudd, MD;  Location: [facility] OR;  Service: General;  Laterality: N/A;  . INCISION AND DRAINAGE PERIRECTAL ABSCESS N/A 11/09/2016   Procedure: IRRIGATION AND DEBRIDEMENT PERIRECTAL ABSCESS;  Surgeon: Glenna Fellows, MD;  Location: [facility];  Service: General;  Laterality: N/A;    Family History  Problem Relation Age of Onset  . Asthma Mother     Social History   Socioeconomic History  . Marital status: Married    Spouse name: Not on file  . Number of children: Not on file  . Years of education: Not on file  . Highest education level: Not on file  Occupational History  . Not on file  Social Needs  . Financial resource strain: Not on file  . Food insecurity:    Worry: Not on file    Inability: Not on file  . Transportation needs:    Medical: Not on file    Non-medical: Not on file  Tobacco Use  . Smoking status: Current Every Day Smoker    Packs/day: 0.50    Years: 18.00    Pack years: 9.00    Types: Cigarettes  . Smokeless tobacco: Never Used  Substance and Sexual Activity  . Alcohol use: Yes    Comment: occassional  . Drug use: No  . Sexual activity: Not on file  Lifestyle  . Physical activity:    Days per week: Not on file  Minutes per session: Not on file  . Stress: Not on file  Relationships  . Social connections:    Talks on phone: Not on file    Gets together: Not on file    Attends religious service: Not on file    Active member of club or organization: Not on file    Attends meetings of clubs or organizations: Not on file    Relationship status: Not on file  . Intimate partner violence:    Fear of current or ex partner: Not on file    Emotionally abused: Not on file    Physically abused: Not on file    Forced sexual activity: Not on file  Other Topics Concern  . Not on file  Social History Narrative  . Not on file     Current Outpatient Medications:  .  acetaminophen (TYLENOL) 325 MG  tablet, You can take plain Tylenol, 2 tablets every 6 hours as needed for pain., Disp: , Rfl:  .  albuterol (PROVENTIL HFA;VENTOLIN HFA) 108 (90 Base) MCG/ACT inhaler, Inhale 1-2 puffs into the lungs every 6 (six) hours as needed for wheezing or shortness of breath., Disp: 1 Inhaler, Rfl: 1 .  albuterol (PROVENTIL) (2.5 MG/3ML) 0.083% nebulizer solution, Take 3 mLs (2.5 mg total) by nebulization every 4 (four) hours as needed for wheezing or shortness of breath., Disp: 75 mL, Rfl: 1 .  amLODipine (NORVASC) 5 MG tablet, Take 1 tablet (5 mg total) by mouth daily., Disp: 90 tablet, Rfl: 3 .  BREO ELLIPTA 100-25 MCG/INH AEPB, Inhale 1 puff into the lungs daily., Disp: 60 each, Rfl: 0 .  methocarbamol (ROBAXIN) 500 MG tablet, Take 1 tablet (500 mg total) by mouth 2 (two) times daily as needed for muscle spasms., Disp: 10 tablet, Rfl: 0 .  Tiotropium Bromide Monohydrate (SPIRIVA RESPIMAT) 2.5 MCG/ACT AERS, Inhale 2 puffs into the lungs daily. (Patient not taking: Reported on 05/22/2018), Disp: 1 Inhaler, Rfl: 3  Allergies  Allergen Reactions  . Penicillins Hives    Has patient had a PCN reaction causing immediate rash, facial/tongue/throat swelling, SOB or lightheadedness with hypotension: yes Has patient had a PCN reaction causing severe rash involving mucus membranes or skin necrosis: no Has patient had a PCN reaction that required hospitalization no Has patient had a PCN reaction occurring within the last 10 years: no If all of the above answers are "NO", then may proceed with Cephalosporin use.      ROS See HPI  Objective  Vitals:   05/22/18 0910  BP: 138/88  Pulse: 84  SpO2: 98%  Weight: 185 lb (83.9 kg)  Height: 5\' 8"  (1.727 m)    Body mass index is 28.13 kg/m.  Physical Exam Vital signs reviewed. Constitutional: Patient appears well-developed and well-nourished. No distress.  HENT: Head: Normocephalic and atraumatic. Nose: Nose normal. Mouth/Throat: Oropharynx is clear and  moist. No oropharyngeal exudate.  Eyes: Conjunctivae and EOM are normal. Pupils are equal, round, and reactive to light. No scleral icterus.  Neck: Normal range of motion. Neck supple.  Cardiovascular: Normal rate, regular rhythm and normal heart sounds.  No murmur heard.  Distal pulses intact. Pulmonary/Chest: Effort normal and breath sounds normal. No respiratory distress. Neurological: He is alert and oriented to person, place, and time. No cranial nerve deficit. Coordination, balance, strength, speech and gait are normal.  Skin: Skin is warm and dry. No rash noted. No erythema.  Psychiatric: Patient has a normal mood and affect. behavior is normal. Judgment and thought  content normal.  Assessment & Plan RTC in 1 month for F/U: HTN- recheck BP, Asthma  Elevated glucose Per patient he was fasting at last OV, with glucose reading of 130 Will check A1c today F/U with further recommendations pending lab results - Hemoglobin A1c; Future  Cigarette nicotine dependence with nicotine-induced disorder Encouraged to quit smoking Discussed options to manage smoking cessation including medications, he is not interested in medications at this time

## 2018-05-22 NOTE — Patient Instructions (Signed)
Please head downstairs for lab work. If any of your test results are critically abnormal, you will be contacted right away. Otherwise, I will contact you within a week about your test results and follow up recommendations  Please return in about 1 month, so I can recheck your blood pressure and see how your breathing is doing.   Asthma, Adult Asthma is a condition of the lungs in which the airways tighten and narrow. Asthma can make it hard to breathe. Asthma cannot be cured, but medicine and lifestyle changes can help control it. Asthma may be started (triggered) by:  Animal skin flakes (dander).  Dust.  Cockroaches.  Pollen.  Mold.  Smoke.  Cleaning products.  Hair sprays or aerosol sprays.  Paint fumes or strong smells.  Cold air, weather changes, and winds.  Crying or laughing hard.  Stress.  Certain medicines or drugs.  Foods, such as dried fruit, potato chips, and sparkling grape juice.  Infections or conditions (colds, flu).  Exercise.  Certain medical conditions or diseases.  Exercise or tiring activities.  Follow these instructions at home:  Take medicine as told by your doctor.  Use a peak flow meter as told by your doctor. A peak flow meter is a tool that measures how well the lungs are working.  Record and keep track of the peak flow meter's readings.  Understand and use the asthma action plan. An asthma action plan is a written plan for taking care of your asthma and treating your attacks.  To help prevent asthma attacks: ? Do not smoke. Stay away from secondhand smoke. ? Change your heating and air conditioning filter often. ? Limit your use of fireplaces and wood stoves. ? Get rid of pests (such as roaches and mice) and their droppings. ? Throw away plants if you see mold on them. ? Clean your floors. Dust regularly. Use cleaning products that do not smell. ? Have someone vacuum when you are not home. Use a vacuum cleaner with a HEPA  filter if possible. ? Replace carpet with wood, tile, or vinyl flooring. Carpet can trap animal skin flakes and dust. ? Use allergy-proof pillows, mattress covers, and box spring covers. ? Wash bed sheets and blankets every week in hot water and dry them in a dryer. ? Use blankets that are made of polyester or cotton. ? Clean bathrooms and kitchens with bleach. If possible, have someone repaint the walls in these rooms with mold-resistant paint. Keep out of the rooms that are being cleaned and painted. ? Wash hands often. Contact a doctor if:  You have make a whistling sound when breaking (wheeze), have shortness of breath, or have a cough even if taking medicine to prevent attacks.  The colored mucus you cough up (sputum) is thicker than usual.  The colored mucus you cough up changes from clear or white to yellow, green, gray, or bloody.  You have problems from the medicine you are taking such as: ? A rash. ? Itching. ? Swelling. ? Trouble breathing.  You need reliever medicines more than 2-3 times a week.  Your peak flow measurement is still at 50-79% of your personal best after following the action plan for 1 hour.  You have a fever. Get help right away if:  You seem to be worse and are not responding to medicine during an asthma attack.  You are short of breath even at rest.  You get short of breath when doing very little activity.  You have trouble  eating, drinking, or talking.  You have chest pain.  You have a fast heartbeat.  Your lips or fingernails start to turn blue.  You are light-headed, dizzy, or faint.  Your peak flow is less than 50% of your personal best. This information is not intended to replace advice given to you by your health care provider. Make sure you discuss any questions you have with your health care provider. Document Released: 02/16/2008 Document Revised: 02/05/2016 Document Reviewed: 03/29/2013 Elsevier Interactive Patient Education   2017 [employer].

## 2018-05-22 NOTE — Assessment & Plan Note (Signed)
Asthma appears to be improving on breo, due to continued albuterol use I am going to have him increase his breo to 200/25 once daily for one week, then decrease dosing back to 100/25 weekly- will send this information with lab results today Additional education including when to seek follow up and emergency care provided IN [facility] RTC in 1 month for follow up

## 2018-05-23 ENCOUNTER — Telehealth: Payer: Self-pay | Admitting: Nurse Practitioner

## 2018-05-23 DIAGNOSIS — R7303 Prediabetes: Secondary | ICD-10-CM

## 2018-05-23 DIAGNOSIS — I10 Essential (primary) hypertension: Secondary | ICD-10-CM

## 2018-05-23 NOTE — Telephone Encounter (Signed)
Refill was sent on 05/04/18. See meds. Please enter referral.

## 2018-05-23 NOTE — Telephone Encounter (Signed)
Referral placed Please let us know if there is some problem picking up his BP medication from [facility], it does look like refills are available

## 2018-05-23 NOTE — Telephone Encounter (Signed)
Pt informed of below.  

## 2018-05-23 NOTE — Telephone Encounter (Signed)
Patient called back in.  I gave him [facility]ble[facility]'s response on labs.  Patient has scheduled his one month follow up.  He will pick up the additional higher dose sample of Breo to start this week for one week.  He would like a referral to a nutritionist.  Also, would like a refill of his BP meds called into [facility].

## 2018-06-19 ENCOUNTER — Encounter: Payer: Self-pay | Admitting: Nurse Practitioner

## 2018-06-19 ENCOUNTER — Ambulatory Visit (INDEPENDENT_AMBULATORY_CARE_PROVIDER_SITE_OTHER): Payer: 59 | Admitting: Nurse Practitioner

## 2018-06-19 VITALS — BP 120/76 | HR 73 | Ht 68.0 in | Wt 188.0 lb

## 2018-06-19 DIAGNOSIS — J4540 Moderate persistent asthma, uncomplicated: Secondary | ICD-10-CM

## 2018-06-19 DIAGNOSIS — I10 Essential (primary) hypertension: Secondary | ICD-10-CM | POA: Diagnosis not present

## 2018-06-19 NOTE — Patient Instructions (Signed)
Diabetes Mellitus and Nutrition When you have diabetes (diabetes mellitus), it is very important to have healthy eating habits because your blood sugar (glucose) levels are greatly affected by what you eat and drink. Eating healthy foods in the appropriate amounts, at about the same times every day, can help you:  Control your blood glucose.  Lower your risk of heart disease.  Improve your blood pressure.  Reach or maintain a healthy weight.  Every person with diabetes is different, and each person has different needs for a meal plan. Your health care provider may recommend that you work with a diet and nutrition specialist (dietitian) to make a meal plan that is best for you. Your meal plan may vary depending on factors such as:  The calories you need.  The medicines you take.  Your weight.  Your blood glucose, blood pressure, and cholesterol levels.  Your activity level.  Other health conditions you have, such as heart or kidney disease.  How do carbohydrates affect me? Carbohydrates affect your blood glucose level more than any other type of food. Eating carbohydrates naturally increases the amount of glucose in your blood. Carbohydrate counting is a method for keeping track of how many carbohydrates you eat. Counting carbohydrates is important to keep your blood glucose at a healthy level, especially if you use insulin or take certain oral diabetes medicines. It is important to know how many carbohydrates you can safely have in each meal. This is different for every person. Your dietitian can help you calculate how many carbohydrates you should have at each meal and for snack. Foods that contain carbohydrates include:  Bread, cereal, rice, pasta, and crackers.  Potatoes and corn.  Peas, beans, and lentils.  Milk and yogurt.  Fruit and juice.  Desserts, such as cakes, cookies, ice cream, and candy.  How does alcohol affect me? Alcohol can cause a sudden decrease in blood  glucose (hypoglycemia), especially if you use insulin or take certain oral diabetes medicines. Hypoglycemia can be a life-threatening condition. Symptoms of hypoglycemia (sleepiness, dizziness, and confusion) are similar to symptoms of having too much alcohol. If your health care provider says that alcohol is safe for you, follow these guidelines:  Limit alcohol intake to no more than 1 drink per day for nonpregnant women and 2 drinks per day for men. One drink equals 12 oz of beer, 5 oz of wine, or 1 oz of hard liquor.  Do not drink on an empty stomach.  Keep yourself hydrated with water, diet soda, or unsweetened iced tea.  Keep in mind that regular soda, juice, and other mixers may contain a lot of sugar and must be counted as carbohydrates.  What are tips for following this plan? Reading food labels  Start by checking the serving size on the label. The amount of calories, carbohydrates, fats, and other nutrients listed on the label are based on one serving of the food. Many foods contain more than one serving per package.  Check the total grams (g) of carbohydrates in one serving. You can calculate the number of servings of carbohydrates in one serving by dividing the total carbohydrates by 15. For example, if a food has 30 g of total carbohydrates, it would be equal to 2 servings of carbohydrates.  Check the number of grams (g) of saturated and trans fats in one serving. Choose foods that have low or no amount of these fats.  Check the number of milligrams (mg) of sodium in one serving. Most people   should limit total sodium intake to less than 2,300 mg per day.  Always check the nutrition information of foods labeled as "low-fat" or "nonfat". These foods may be higher in added sugar or refined carbohydrates and should be avoided.  Talk to your dietitian to identify your daily goals for nutrients listed on the label. Shopping  Avoid buying canned, premade, or processed foods. These  foods tend to be high in fat, sodium, and added sugar.  Shop around the outside edge of the grocery store. This includes fresh fruits and vegetables, bulk grains, fresh meats, and fresh dairy. Cooking  Use low-heat cooking methods, such as baking, instead of high-heat cooking methods like deep frying.  Cook using healthy oils, such as olive, canola, or sunflower oil.  Avoid cooking with butter, cream, or high-fat meats. Meal planning  Eat meals and snacks regularly, preferably at the same times every day. Avoid going long periods of time without eating.  Eat foods high in fiber, such as fresh fruits, vegetables, beans, and whole grains. Talk to your dietitian about how many servings of carbohydrates you can eat at each meal.  Eat 4-6 ounces of lean protein each day, such as lean meat, chicken, fish, eggs, or tofu. 1 ounce is equal to 1 ounce of meat, chicken, or fish, 1 egg, or 1/4 cup of tofu.  Eat some foods each day that contain healthy fats, such as avocado, nuts, seeds, and fish. Lifestyle   Check your blood glucose regularly.  Exercise at least 30 minutes 5 or more days each week, or as told by your health care provider.  Take medicines as told by your health care provider.  Do not use any products that contain nicotine or tobacco, such as cigarettes and e-cigarettes. If you need help quitting, ask your health care provider.  Work with a counselor or diabetes educator to identify strategies to manage stress and any emotional and social challenges. What are some questions to ask my health care provider?  Do I need to meet with a diabetes educator?  Do I need to meet with a dietitian?  What number can I call if I have questions?  When are the best times to check my blood glucose? Where to find more information:  American Diabetes Association: diabetes.org/food-and-fitness/food  Academy of Nutrition and Dietetics:  www.eatright.org/resources/health/diseases-and-conditions/diabetes  National Institute of Diabetes and Digestive and Kidney Diseases (NIH): www.niddk.nih.gov/health-information/diabetes/overview/diet-eating-physical-activity Summary  A healthy meal plan will help you control your blood glucose and maintain a healthy lifestyle.  Working with a diet and nutrition specialist (dietitian) can help you make a meal plan that is best for you.  Keep in mind that carbohydrates and alcohol have immediate effects on your blood glucose levels. It is important to count carbohydrates and to use alcohol carefully. This information is not intended to replace advice given to you by your health care provider. Make sure you discuss any questions you have with your health care provider. Document Released: 05/27/2005 Document Revised: 10/04/2016 Document Reviewed: 10/04/2016 Elsevier Interactive Patient Education  2018 [employer] Inc.  

## 2018-06-19 NOTE — Progress Notes (Signed)
Jay Gentry is a 42 y.o. male with the following history as recorded in [employer]:  Patient Active Problem List   Diagnosis Date Noted  . Essential hypertension 05/04/2018  . Moderate persistent asthma without complication 05/04/2018  . Perirectal abscess 05/14/2015    Current Outpatient Medications  Medication Sig Dispense Refill  . acetaminophen (TYLENOL) 325 MG tablet You can take plain Tylenol, 2 tablets every 6 hours as needed for pain.    Marland Kitchen albuterol (PROVENTIL HFA;VENTOLIN HFA) 108 (90 Base) MCG/ACT inhaler Inhale 1-2 puffs into the lungs every 6 (six) hours as needed for wheezing or shortness of breath. 1 Inhaler 1  . albuterol (PROVENTIL) (2.5 MG/3ML) 0.083% nebulizer solution Take 3 mLs (2.5 mg total) by nebulization every 4 (four) hours as needed for wheezing or shortness of breath. 75 mL 1  . amLODipine (NORVASC) 5 MG tablet Take 1 tablet (5 mg total) by mouth daily. 90 tablet 3  . BREO ELLIPTA 100-25 MCG/INH AEPB Inhale 1 puff into the lungs daily. 60 each 0  . methocarbamol (ROBAXIN) 500 MG tablet Take 1 tablet (500 mg total) by mouth 2 (two) times daily as needed for muscle spasms. 10 tablet 0  . Tiotropium Bromide Monohydrate (SPIRIVA RESPIMAT) 2.5 MCG/ACT AERS Inhale 2 puffs into the lungs daily. 1 Inhaler 3   No current facility-administered medications for this visit.     Allergies: Penicillins  Past Medical History:  Diagnosis Date  . Asthma   . Bronchitis   . Perirectal abscess 05/14/2015    Past Surgical History:  Procedure Laterality Date  . INCISION AND DRAINAGE PERIRECTAL ABSCESS  05/14/2015  . INCISION AND DRAINAGE PERIRECTAL ABSCESS N/A 05/14/2015   Procedure: DRAINAGE PERIRECTAL ABSCESS;  Surgeon: Manus Rudd, MD;  Location: [facility];  Service: General;  Laterality: N/A;  . INCISION AND DRAINAGE PERIRECTAL ABSCESS N/A 11/09/2016   Procedure: IRRIGATION AND DEBRIDEMENT PERIRECTAL ABSCESS;  Surgeon: Glenna Fellows, MD;  Location: [facility];  Service:  General;  Laterality: N/A;    Family History  Problem Relation Age of Onset  . Asthma Mother     Social History   Tobacco Use  . Smoking status: Current Every Day Smoker    Packs/day: 0.50    Years: 18.00    Pack years: 9.00    Types: Cigarettes  . Smokeless tobacco: Never Used  Substance Use Topics  . Alcohol use: Yes    Comment: occassional     Subjective:  Jay Gentry is here today for f/u of HTN, asthma.  Hypertension -maintained on amlodipine 5 daily, which was started around end of august for elevated BP Reports he sometimes misses a dose but tries to take daily, has not noted any adverse medication effects Does not report bp readings today No headaches, vision changes, edema.  BP Readings from Last 3 Encounters:  06/19/18 120/76  05/22/18 138/88  05/04/18 (!) 140/98    Asthma- restarted on breo around the end of august due to daily symptoms of asthma, he reported some improvement at his last OV on 05/22/18 but was still using his albuterol about 7 times a week, especially when at work, so he was given breo sample 200/25 to use once daily for one week, then instructed to decrease breo back to 100/25 daily, he says he followed these instructions and has had great improvement in his asthma over past month, now using albuterol one puff every 2 days or less. He has also quit smoking, 2 weeks ago, and since then has experienced  even greater improvement in his breathing, feeling more energy Denies fevers, chills, wheezing, cough  ROS- See HPI  Objective:  Vitals:   06/19/18 1131  BP: 120/76  Pulse: 73  SpO2: 97%  Weight: 188 lb (85.3 kg)  Height: 5\' 8"  (1.727 m)    General: Well developed, well nourished, in no acute distress  Skin : Warm and dry.  Head: Normocephalic and atraumatic  Eyes: Sclera and conjunctiva clear; pupils round and reactive to light; extraocular movements intact  Oropharynx: Pink, supple. No suspicious lesions  Neck: Supple. Lungs: Respirations  unlabored; clear to auscultation bilaterally without wheeze, rales, rhonchi  CVS exam: normal rate, regular rhythm, normal S1, S2, no murmurs, distal pulses intact Extremities: No edema Neurologic: Alert and oriented; speech intact; face symmetrical; moves all extremities well; CNII-XII intact without focal deficit    Assessment:  1. Essential hypertension   2. Moderate persistent asthma without complication     Plan:    Return in about 5 months (around 11/18/2018) for CPE. -repeat A1c, BP reading, check on asthma No orders of the defined types were placed in this encounter.   Requested Prescriptions    No prescriptions requested or ordered in this encounter

## 2018-06-19 NOTE — Assessment & Plan Note (Signed)
Stable Continue amlodipine at current dosage Continue to monitor

## 2018-06-19 NOTE — Assessment & Plan Note (Signed)
Improvement on breo and with smoking cessation, will continue breo and albuterol PRN, encouraged to stay tobacco free F/u for new symptoms or if symptoms worsen, return on current treatment

## 2018-06-26 ENCOUNTER — Ambulatory Visit: Payer: 59 | Admitting: Registered"

## 2018-06-27 ENCOUNTER — Other Ambulatory Visit: Payer: Self-pay | Admitting: Nurse Practitioner

## 2018-07-25 ENCOUNTER — Telehealth: Payer: Self-pay | Admitting: *Deleted

## 2018-07-25 MED ORDER — BREO ELLIPTA 100-25 MCG/INH IN AEPB: 1.0000 | INHALATION_SPRAY | Freq: Every day | RESPIRATORY_TRACT | 0 refills | Status: DC

## 2018-07-25 NOTE — Telephone Encounter (Signed)
1 sample box of BREO ELLIPTA 100-25 MCG/INH  upfront for p/u.  Pt informed.  Copied from CRM [phone]. Topic: General - Other >> Jul 25, 2018  1:53 PM Tamela OddiHarris, Brenda J wrote: Reason for CRM: Patient called to inquire if the office had an samples of BREO ELLIPTA 100-25 MCG/INH AEPB.  Called the office and was informed that it would have to be approved by the doctor and then if there are any samples the office would call the patient to let him know.  CB# for patient [phone].

## 2018-08-22 ENCOUNTER — Other Ambulatory Visit: Payer: Self-pay | Admitting: Nurse Practitioner

## 2018-08-22 NOTE — Telephone Encounter (Signed)
Copied from CRM [phone]. Topic: Quick Communication - Rx Refill/Question >> Aug 22, 2018  4:58 PM Wyonia HoughJohnson, Chaz E wrote: Medication: BREO ELLIPTA 100-25 MCG/INH AEPB   Has the patient contacted their pharmacy? Yes.  Pharmacy advised that they sent in request 72 hours ago with no response and to call office   Preferred Pharmacy (with phone number or street name): [id number], [place] - [address] [phone] (Phone) [phone] (Fax)

## 2018-08-24 MED ORDER — BREO ELLIPTA 100-25 MCG/INH IN AEPB: 1.0000 | INHALATION_SPRAY | Freq: Every day | RESPIRATORY_TRACT | 0 refills | Status: DC

## 2018-08-29 ENCOUNTER — Other Ambulatory Visit: Payer: Self-pay | Admitting: Emergency Medicine

## 2018-08-29 MED ORDER — BREO ELLIPTA 100-25 MCG/INH IN AEPB
1.0000 | INHALATION_SPRAY | Freq: Every day | RESPIRATORY_TRACT | 0 refills | Status: DC
Start: 2018-08-29 — End: 2018-10-02

## 2018-09-05 ENCOUNTER — Emergency Department (HOSPITAL_COMMUNITY)
Admission: EM | Admit: 2018-09-05 | Discharge: 2018-09-05 | Disposition: A | Discharge status: Home / Self Care | Payer: 59 | Attending: Emergency Medicine | Admitting: Emergency Medicine

## 2018-09-05 ENCOUNTER — Emergency Department (HOSPITAL_COMMUNITY): Payer: 59

## 2018-09-05 ENCOUNTER — Encounter (HOSPITAL_COMMUNITY): Payer: Self-pay

## 2018-09-05 DIAGNOSIS — F10920 Alcohol use, unspecified with intoxication, uncomplicated: Secondary | ICD-10-CM | POA: Insufficient documentation

## 2018-09-05 DIAGNOSIS — Z79899 Other long term (current) drug therapy: Secondary | ICD-10-CM | POA: Insufficient documentation

## 2018-09-05 DIAGNOSIS — F17210 Nicotine dependence, cigarettes, uncomplicated: Secondary | ICD-10-CM | POA: Insufficient documentation

## 2018-09-05 DIAGNOSIS — R55 Syncope and collapse: Secondary | ICD-10-CM | POA: Insufficient documentation

## 2018-09-05 HISTORY — DX: Essential (primary) hypertension: I10

## 2018-09-05 LAB — CBC WITH DIFFERENTIAL/PLATELET
Abs Immature Granulocytes: 0.03 10*3/uL (ref 0.00–0.07)
Basophils Absolute: 0 10*3/uL (ref 0.0–0.1)
Basophils Relative: 1 %
Eosinophils Absolute: 0.1 10*3/uL (ref 0.0–0.5)
Eosinophils Relative: 1 %
HCT: 46.9 % (ref 39.0–52.0)
Hemoglobin: 16.8 g/dL (ref 13.0–17.0)
Immature Granulocytes: 1 %
Lymphocytes Relative: 49 %
Lymphs Abs: 2.7 10*3/uL (ref 0.7–4.0)
MCH: 31.4 pg (ref 26.0–34.0)
MCHC: 35.8 g/dL (ref 30.0–36.0)
MCV: 87.7 fL (ref 80.0–100.0)
Monocytes Absolute: 0.3 10*3/uL (ref 0.1–1.0)
Monocytes Relative: 6 %
Neutro Abs: 2.3 10*3/uL (ref 1.7–7.7)
Neutrophils Relative %: 42 %
Platelets: 286 10*3/uL (ref 150–400)
RBC: 5.35 MIL/uL (ref 4.22–5.81)
RDW: 13.5 % (ref 11.5–15.5)
WBC: 5.4 10*3/uL (ref 4.0–10.5)
nRBC: 0 % (ref 0.0–0.2)

## 2018-09-05 LAB — RAPID URINE DRUG SCREEN, HOSP PERFORMED
Amphetamines: NOT DETECTED
Barbiturates: NOT DETECTED
Benzodiazepines: NOT DETECTED
Cocaine: NOT DETECTED
Opiates: NOT DETECTED
Tetrahydrocannabinol: NOT DETECTED

## 2018-09-05 LAB — CBG MONITORING, ED: Glucose-Capillary: 115 mg/dL — ABNORMAL HIGH (ref 70–99)

## 2018-09-05 LAB — COMPREHENSIVE METABOLIC PANEL
ALT: 58 U/L — ABNORMAL HIGH (ref 0–44)
AST: 49 U/L — ABNORMAL HIGH (ref 15–41)
Albumin: 3.9 g/dL (ref 3.5–5.0)
Alkaline Phosphatase: 44 U/L (ref 38–126)
Anion gap: 16 — ABNORMAL HIGH (ref 5–15)
BUN: 11 mg/dL (ref 6–20)
CO2: 18 mmol/L — ABNORMAL LOW (ref 22–32)
Calcium: 8.6 mg/dL — ABNORMAL LOW (ref 8.9–10.3)
Chloride: 108 mmol/L (ref 98–111)
Creatinine, Ser: 1.21 mg/dL (ref 0.61–1.24)
GFR calc Af Amer: >60 mL/min (ref >60)
GFR calc non Af Amer: >60 mL/min (ref >60)
Glucose, Bld: 117 mg/dL — ABNORMAL HIGH (ref 70–99)
Potassium: 5.1 mmol/L (ref 3.5–5.1)
Sodium: 142 mmol/L (ref 135–145)
Total Bilirubin: 1.9 mg/dL — ABNORMAL HIGH (ref 0.3–1.2)
Total Protein: 6.2 g/dL — ABNORMAL LOW (ref 6.5–8.1)

## 2018-09-05 LAB — I-STAT TROPONIN, ED: Troponin i, poc: 0 ng/mL (ref 0.00–0.08)

## 2018-09-05 LAB — URINALYSIS, ROUTINE W REFLEX MICROSCOPIC
Bacteria, UA: NONE SEEN
Bilirubin Urine: NEGATIVE
Glucose, UA: NEGATIVE mg/dL
Ketones, ur: NEGATIVE mg/dL
Leukocytes, UA: NEGATIVE
Nitrite: NEGATIVE
Protein, ur: 30 mg/dL — AB
Specific Gravity, Urine: 1.01 (ref 1.005–1.030)
pH: 6 (ref 5.0–8.0)

## 2018-09-05 LAB — INFLUENZA PANEL BY PCR (TYPE A & B)
Influenza A By PCR: NEGATIVE
Influenza B By PCR: NEGATIVE

## 2018-09-05 LAB — ETHANOL: Alcohol, Ethyl (B): 251 mg/dL — ABNORMAL HIGH (ref <10)

## 2018-09-05 MED ORDER — SODIUM CHLORIDE 0.9 % IV BOLUS
500.0000 mL | Freq: Once | INTRAVENOUS | Status: AC
  Administered 2018-09-05: 500 mL via INTRAVENOUS

## 2018-09-05 MED ORDER — SODIUM CHLORIDE 0.9 % IV BOLUS
1000.0000 mL | Freq: Once | INTRAVENOUS | Status: AC
  Administered 2018-09-05: 1000 mL via INTRAVENOUS

## 2018-09-05 NOTE — ED Triage Notes (Signed)
Patient BIB [facility] for syncope. Per EMS patient's wife states he was working outside when he felt like he couldn't breath and when he came inside the house he started "foaming at the mouth and fell backwards. He did not hit his head, he just slumped down into the floor. No seizure activity per wife". ETOH + Hx. HTN and asthma. Patient at this time only c/o RIGHT arm pain, headache, and RIGHT leg pain.   BP 140/110 ---he did not take BP meds today HR 100 RR 20  CBG 117

## 2018-09-05 NOTE — ED Notes (Signed)
Pt given ice water and graham crackers for PO challenge 

## 2018-09-05 NOTE — ED Provider Notes (Signed)
[facility] EMERGENCY DEPARTMENT Provider Note   CSN: [id number] Arrival date & time: 09/05/18  0114     History   Chief Complaint Chief Complaint  Patient presents with  . Near Syncope    HPI Jay Gentry is a 42 y.o. male with a hx of asthma, bronchitis, hypertension, perirectal abscess presents to the Emergency Department after acute episode of syncope just prior to arrival.  Patient reports he was celebrating Christmas and had at least half 1/5 of rum to drink tonight.  He reports he went outside to smoke.  Patient states that he got lightheaded, felt as if he could not breathe and then had a syncopal episode.  Per mass, patient's wife reports that he slumped to the floor but did not hit his head.  She denied any seizure activity as well.  She is complaining of associated right arm pain and right leg pain.  Additionally he also complains of mild headache but denies vision changes, numbness, tingling or weakness.  No neck or back pain.  No loss of bowel or bladder control.    No known aggravating or alleviating factors.  Patient reports recent URI symptoms with cough and nasal congestion but denies fevers at home.  Does report his children at home are sick.  Patient denies chest pain or shortness of breath prior to the episode.   The history is provided by the patient and medical records. No language interpreter was used.    Past Medical History:  Diagnosis Date  . Asthma   . Bronchitis   . Hypertension   . Perirectal abscess 05/14/2015    Patient Active Problem List   Diagnosis Date Noted  . Essential hypertension 05/04/2018  . Moderate persistent asthma without complication 05/04/2018  . Perirectal abscess 05/14/2015    Past Surgical History:  Procedure Laterality Date  . INCISION AND DRAINAGE PERIRECTAL ABSCESS  05/14/2015  . INCISION AND DRAINAGE PERIRECTAL ABSCESS N/A 05/14/2015   Procedure: DRAINAGE PERIRECTAL ABSCESS;  Surgeon: Manus Rudd,  MD;  Location: [facility] OR;  Service: General;  Laterality: N/A;  . INCISION AND DRAINAGE PERIRECTAL ABSCESS N/A 11/09/2016   Procedure: IRRIGATION AND DEBRIDEMENT PERIRECTAL ABSCESS;  Surgeon: Glenna Fellows, MD;  Location: [facility] ORS;  Service: General;  Laterality: N/A;        Home Medications    Prior to Admission medications   Medication Sig Start Date End Date Taking? Authorizing Provider  acetaminophen (TYLENOL) 325 MG tablet You can take plain Tylenol, 2 tablets every 6 hours as needed for pain. 11/09/16   Sherrie George, PA-C  albuterol (PROVENTIL) (2.5 MG/3ML) 0.083% nebulizer solution Take 3 mLs (2.5 mg total) by nebulization every 4 (four) hours as needed for wheezing or shortness of breath. 04/28/14   Excell Seltzer, Adrian Blackwater, PA-C  amLODipine (NORVASC) 5 MG tablet Take 1 tablet (5 mg total) by mouth daily. 05/04/18   Shambley, Audie Box, NP  BREO ELLIPTA 100-25 MCG/INH AEPB Inhale 1 puff into the lungs daily. 08/29/18   Evaristo Bury, NP  methocarbamol (ROBAXIN) 500 MG tablet Take 1 tablet (500 mg total) by mouth 2 (two) times daily as needed for muscle spasms. 03/14/18   Cathie Hoops, Amy V, PA-C  Tiotropium Bromide Monohydrate (SPIRIVA RESPIMAT) 2.5 MCG/ACT AERS Inhale 2 puffs into the lungs daily. 05/04/18   Evaristo Bury, NP  VENTOLIN HFA 108 (90 Base) MCG/ACT inhaler INHALE 1 TO 2 PUFFS INTO THE LUNGS EVERY 6 HOURS FOR AS NEEDED FOR WHEEZING OR SHORTNESS OF BREATH  06/27/18   Evaristo Bury, NP    Family History Family History  Problem Relation Age of Onset  . Asthma Mother     Social History Social History   Tobacco Use  . Smoking status: Current Every Day Smoker    Packs/day: 0.50    Years: 18.00    Pack years: 9.00    Types: Cigarettes  . Smokeless tobacco: Never Used  Substance Use Topics  . Alcohol use: Yes    Comment: occassional  . Drug use: No     Allergies   Penicillins   Review of Systems Review of Systems  Constitutional: Negative for appetite  change, diaphoresis, fatigue, fever and unexpected weight change.  HENT: Positive for congestion. Negative for mouth sores.   Eyes: Negative for visual disturbance.  Respiratory: Positive for cough and wheezing ( Intermittent). Negative for chest tightness and shortness of breath.   Cardiovascular: Positive for near-syncope. Negative for chest pain.  Gastrointestinal: Negative for abdominal pain, constipation, diarrhea, nausea and vomiting.  Endocrine: Negative for polydipsia, polyphagia and polyuria.  Genitourinary: Negative for dysuria, frequency, hematuria and urgency.  Musculoskeletal: Positive for arthralgias. Negative for back pain and neck stiffness.  Skin: Negative for rash.  Allergic/Immunologic: Negative for immunocompromised state.  Neurological: Positive for syncope. Negative for light-headedness and headaches.  Hematological: Does not bruise/bleed easily.  Psychiatric/Behavioral: Negative for sleep disturbance. The patient is not nervous/anxious.      Physical Exam Updated Vital Signs BP 100/65   Pulse 82   Temp 98.1 F (36.7 C) (Oral)   Resp 10   SpO2 90%   Physical Exam Vitals signs and nursing note reviewed.  Constitutional:      General: He is not in acute distress.    Appearance: He is well-developed. He is not diaphoretic.  HENT:     Head: Normocephalic and atraumatic.  Eyes:     General: No scleral icterus.    Conjunctiva/sclera: Conjunctivae normal.     Pupils: Pupils are equal, round, and reactive to light.     Comments: No horizontal, vertical or rotational nystagmus  Neck:     Musculoskeletal: Normal range of motion and neck supple.     Comments: Full active and passive ROM without pain No midline or paraspinal tenderness No nuchal rigidity or meningeal signs Cardiovascular:     Rate and Rhythm: Normal rate and regular rhythm.  Pulmonary:     Effort: Pulmonary effort is normal. No respiratory distress.     Breath sounds: Examination of the  right-lower field reveals decreased breath sounds. Decreased breath sounds present. No wheezing or rales.  Abdominal:     General: Bowel sounds are normal.     Palpations: Abdomen is soft.     Tenderness: There is no abdominal tenderness. There is no guarding or rebound.  Musculoskeletal: Normal range of motion.     Comments: Erythema and mild swelling to the right forearm.  Significant tenderness to palpation but no palpable or visible deformity.  Lymphadenopathy:     Cervical: No cervical adenopathy.  Skin:    General: Skin is warm and dry.     Findings: No rash.  Neurological:     Mental Status: He is alert and oriented to person, place, and time.     Cranial Nerves: No cranial nerve deficit.     Motor: No abnormal muscle tone.     Coordination: Coordination normal.     Comments: Mental Status:  Alert, oriented, thought content appropriate. Speech fluent without evidence  of aphasia. Able to follow 2 step commands without difficulty.  Cranial Nerves:  II:  Peripheral visual fields grossly normal, pupils equal, round, reactive to light III,IV, VI: ptosis not present, extra-ocular motions intact bilaterally  V,VII: smile symmetric, facial light touch sensation equal VIII: hearing grossly normal bilaterally  IX,X: midline uvula rise  XI: bilateral shoulder shrug equal and strong XII: midline tongue extension  Motor:  5/5 in upper and lower extremities bilaterally including strong and equal grip strength and dorsiflexion/plantar flexion Sensory: Pinprick and light touch normal in all extremities.  Cerebellar: normal finger-to-nose with bilateral upper extremities Gait: Testing deferred CV: distal pulses palpable throughout   Psychiatric:        Behavior: Behavior normal.        Thought Content: Thought content normal.        Judgment: Judgment normal.      ED Treatments / Results  Labs (all labs ordered are listed, but only abnormal results are displayed) Labs Reviewed    COMPREHENSIVE METABOLIC PANEL - Abnormal; Notable for the following components:      Result Value   CO2 18 (*)    Glucose, Bld 117 (*)    Calcium 8.6 (*)    Total Protein 6.2 (*)    AST 49 (*)    ALT 58 (*)    Total Bilirubin 1.9 (*)    Anion gap 16 (*)    All other components within normal limits  URINALYSIS, ROUTINE W REFLEX MICROSCOPIC - Abnormal; Notable for the following components:   Hgb urine dipstick SMALL (*)    Protein, ur 30 (*)    All other components within normal limits  ETHANOL - Abnormal; Notable for the following components:   Alcohol, Ethyl (B) 251 (*)    All other components within normal limits  CBG MONITORING, ED - Abnormal; Notable for the following components:   Glucose-Capillary 115 (*)    All other components within normal limits  CBC WITH DIFFERENTIAL/PLATELET  RAPID URINE DRUG SCREEN, HOSP PERFORMED  INFLUENZA PANEL BY PCR (TYPE A & B)  I-STAT TROPONIN, ED    EKG EKG Interpretation  Date/Time:  Tuesday September 05 2018 02:14:18 EST Ventricular Rate:  83 PR Interval:    QRS Duration: 95 QT Interval:  382 QTC Calculation: 449 R Axis:   14 Text Interpretation:  Sinus rhythm ST elev, probable normal early repol pattern No significant change since last tracing Confirmed by Zadie RhineWickline, Donald ([id number]) on 09/05/2018 2:31:07 AM   Radiology Dg Chest 2 View  Result Date: 09/05/2018 CLINICAL DATA:  Acute onset of cough and wheezing. Status post fall. EXAM: CHEST - 2 VIEW COMPARISON:  Chest radiograph performed 02/01/2018 FINDINGS: The lungs are well-aerated and clear. There is no evidence of focal opacification, pleural effusion or pneumothorax. The heart is normal in size; the mediastinal contour is within normal limits. No acute osseous abnormalities are seen. IMPRESSION: No acute cardiopulmonary process seen. Electronically Signed   By: Roanna RaiderJeffery  Chang M.D.   On: 09/05/2018 03:50   Dg Forearm Right  Result Date: 09/05/2018 CLINICAL DATA:  Status post  fall, with acute onset of right mid forearm pain. Initial encounter. EXAM: RIGHT FOREARM - 2 VIEW COMPARISON:  None. FINDINGS: There is no evidence of fracture or dislocation. The radius and ulna appear intact. No elbow joint effusion is identified. The carpal rows appear grossly intact, and demonstrate normal alignment. IMPRESSION: No evidence of fracture or dislocation. Electronically Signed   By: Beryle BeamsJeffery  Chang M.D.  On: 09/05/2018 03:51    Procedures Procedures (including critical care time)  Medications Ordered in ED Medications  sodium chloride 0.9 % bolus 500 mL (500 mLs Intravenous New Bag/Given 09/05/18 0600)  sodium chloride 0.9 % bolus 1,000 mL (0 mLs Intravenous Stopped 09/05/18 0500)     Initial Impression / Assessment and Plan / ED Course  I have reviewed the triage vital signs and the nursing notes.  Pertinent labs & imaging results that were available during my care of the patient were reviewed by me and considered in my medical decision making (see chart for details).  Clinical Course as of Sep 06 639  Tue Sep 05, 2018  0557 Pt reports he is feeling some better.  Pt is without orthostasis.     [HM]  0636 Ambulated here in the emergency department without difficulty.  He is tolerating p.o. without emesis.   [HM]    Clinical Course User Index [HM] Kemiah Booz, Dahlia ClientHannah, PA-C    Patient presents to the emergency department for syncopal episode.  He is alert and oriented with normal neurologic exam on my evaluation.  He has right forearm pain likely from the fall.  Small contusion is noted.  X-rays without acute abnormality including no evidence of fracture.  Orthostatic VS for the past 24 hrs:  BP- Lying Pulse- Lying BP- Sitting Pulse- Sitting BP- Standing at 0 minutes Pulse- Standing at 0 minutes  09/05/18 0446 112/78 82 106/83 89 114/85 89   Labs are reassuring.  Negative troponin.  EKG without evidence of acute ischemia.  Patient did have some mild wheezing on  exam however chest x-ray is without evidence of pneumonia.  Patient is afebrile without tachycardia.  Patient is not orthostatic.  Patient is acutely intoxicated.  UDS without evidence of drug abuse.  Patient denies drug usage.  CMP with low bicarb, elevated AST ALT and elevated anion gap.  Suspect this is secondary to his alcohol usage tonight.  Alcohol level 251.  Patient is well-appearing.  6:39 AM Patient remains alert and oriented.  No additional syncope or near syncope.  No arrhythmias here in the emergency department.  Patient ambulates without feeling of lightheadedness.  No emesis.  At this time patient is safe for discharge home.   Final Clinical Impressions(s) / ED Diagnoses   Final diagnoses:  Syncope and collapse  Alcoholic intoxication without complication [facility](HCC)    ED Discharge Orders    None       Milta DeitersMuthersbaugh, Jupiter Boys, PA-C 09/05/18 [id number]    Zadie RhineWickline, Donald, MD 09/05/18 [id number]

## 2018-09-05 NOTE — ED Notes (Signed)
Reviewed d/c instructions with pt, who verbalized understanding and had no outstanding questions. Pt departed in NAD.   

## 2018-09-05 NOTE — ED Notes (Signed)
Pt ambulatory to and from hallway bathroom without difficulty or need of assistance.  

## 2018-09-05 NOTE — Discharge Instructions (Addendum)
1. Medications: usual home medications 2. Treatment: rest, drink plenty of fluids,  3. Follow Up: Please followup with your primary doctor in 2-3 days for discussion of your diagnoses and further evaluation after today's visit; if you do not have a primary care doctor use the resource guide provided to find one; Please return to the ER for symptoms, new or worsening symptoms.

## 2018-09-05 NOTE — ED Notes (Signed)
ED Provider at bedside. 

## 2018-09-20 ENCOUNTER — Other Ambulatory Visit: Payer: Self-pay | Admitting: Nurse Practitioner

## 2018-09-30 ENCOUNTER — Other Ambulatory Visit: Payer: Self-pay | Admitting: Nurse Practitioner

## 2018-10-23 ENCOUNTER — Telehealth: Payer: Self-pay | Admitting: Nurse Practitioner

## 2018-10-23 NOTE — Telephone Encounter (Signed)
Copied from CRM [id number]. Topic: Quick Communication - See Telephone Encounter >> Oct 23, 2018  8:11 AM Louie Bun, Rosey Bath D wrote: CRM for notification. See Telephone encounter for: 10/23/18. Patient called and would like a call back from [facility] or her CMA regarding getting samples of  his Brio and Pro-Air. Patient said that he is out and doesn't have the money to get the medication now. Please call patient back.

## 2018-10-23 NOTE — Telephone Encounter (Signed)
Spoke with pt to inform he could come by office for sample of Breo. Good RX card given to pt to use with Ventolin inhaler. Pt also informed to contact insurance company to see which pharmacy was his preferred pharmacy.

## 2018-11-10 ENCOUNTER — Emergency Department (HOSPITAL_COMMUNITY): Payer: 59

## 2018-11-10 ENCOUNTER — Inpatient Hospital Stay (HOSPITAL_COMMUNITY)
Admission: EM | Admit: 2018-11-10 | Discharge: 2018-11-14 | DRG: 208 | Disposition: A | Discharge status: Home / Self Care | Payer: 59 | Attending: General Surgery | Admitting: General Surgery

## 2018-11-10 ENCOUNTER — Encounter (HOSPITAL_COMMUNITY): Payer: Self-pay

## 2018-11-10 DIAGNOSIS — J9601 Acute respiratory failure with hypoxia: Secondary | ICD-10-CM | POA: Diagnosis present

## 2018-11-10 DIAGNOSIS — F1010 Alcohol abuse, uncomplicated: Secondary | ICD-10-CM | POA: Diagnosis present

## 2018-11-10 DIAGNOSIS — R4189 Other symptoms and signs involving cognitive functions and awareness: Secondary | ICD-10-CM

## 2018-11-10 DIAGNOSIS — Z79899 Other long term (current) drug therapy: Secondary | ICD-10-CM

## 2018-11-10 DIAGNOSIS — Z880 Allergy status to penicillin: Secondary | ICD-10-CM

## 2018-11-10 DIAGNOSIS — S32019A Unspecified fracture of first lumbar vertebra, initial encounter for closed fracture: Secondary | ICD-10-CM | POA: Diagnosis present

## 2018-11-10 DIAGNOSIS — S27321A Contusion of lung, unilateral, initial encounter: Secondary | ICD-10-CM | POA: Diagnosis present

## 2018-11-10 DIAGNOSIS — Y92410 Unspecified street and highway as the place of occurrence of the external cause: Secondary | ICD-10-CM | POA: Diagnosis not present

## 2018-11-10 DIAGNOSIS — R40243 Glasgow coma scale score 3-8, unspecified time: Secondary | ICD-10-CM | POA: Diagnosis present

## 2018-11-10 DIAGNOSIS — E876 Hypokalemia: Secondary | ICD-10-CM | POA: Diagnosis present

## 2018-11-10 DIAGNOSIS — J9690 Respiratory failure, unspecified, unspecified whether with hypoxia or hypercapnia: Secondary | ICD-10-CM

## 2018-11-10 LAB — CBC
HCT: 50.5 % (ref 39.0–52.0)
Hemoglobin: 16.3 g/dL (ref 13.0–17.0)
MCH: 29.5 pg (ref 26.0–34.0)
MCHC: 32.3 g/dL (ref 30.0–36.0)
MCV: 91.3 fL (ref 80.0–100.0)
Platelets: 298 10*3/uL (ref 150–400)
RBC: 5.53 MIL/uL (ref 4.22–5.81)
RDW: 13.2 % (ref 11.5–15.5)
WBC: 9.1 10*3/uL (ref 4.0–10.5)
nRBC: 0 % (ref 0.0–0.2)

## 2018-11-10 LAB — COMPREHENSIVE METABOLIC PANEL
ALT: 44 U/L (ref 0–44)
AST: 34 U/L (ref 15–41)
Albumin: 4.4 g/dL (ref 3.5–5.0)
Alkaline Phosphatase: 53 U/L (ref 38–126)
Anion gap: 15 (ref 5–15)
BUN: 13 mg/dL (ref 6–20)
CO2: 20 mmol/L — ABNORMAL LOW (ref 22–32)
Calcium: 8.9 mg/dL (ref 8.9–10.3)
Chloride: 104 mmol/L (ref 98–111)
Creatinine, Ser: 1.73 mg/dL — ABNORMAL HIGH (ref 0.61–1.24)
GFR calc Af Amer: 55 mL/min — ABNORMAL LOW (ref >60)
GFR calc non Af Amer: 48 mL/min — ABNORMAL LOW (ref >60)
Glucose, Bld: 234 mg/dL — ABNORMAL HIGH (ref 70–99)
Potassium: 4.1 mmol/L (ref 3.5–5.1)
Sodium: 139 mmol/L (ref 135–145)
Total Bilirubin: 0.5 mg/dL (ref 0.3–1.2)
Total Protein: 7.1 g/dL (ref 6.5–8.1)

## 2018-11-10 LAB — POCT I-STAT 7, (LYTES, BLD GAS, ICA,H+H)
Acid-base deficit: 4 mmol/L — ABNORMAL HIGH (ref 0.0–2.0)
Bicarbonate: 25 mmol/L (ref 20.0–28.0)
Calcium, Ion: 1.21 mmol/L (ref 1.15–1.40)
HCT: 40 % (ref 39.0–52.0)
Hemoglobin: 13.6 g/dL (ref 13.0–17.0)
O2 Saturation: 100 %
Patient temperature: 98.6
Potassium: 3.3 mmol/L — ABNORMAL LOW (ref 3.5–5.1)
Sodium: 136 mmol/L (ref 135–145)
TCO2: 27 mmol/L (ref 22–32)
pCO2 arterial: 61.4 mmHg — ABNORMAL HIGH (ref 32.0–48.0)
pH, Arterial: 7.218 — ABNORMAL LOW (ref 7.350–7.450)
pO2, Arterial: 496 mmHg — ABNORMAL HIGH (ref 83.0–108.0)

## 2018-11-10 LAB — LACTIC ACID, PLASMA: Lactic Acid, Venous: 3.9 mmol/L (ref 0.5–1.9)

## 2018-11-10 LAB — PROTIME-INR
INR: 1 (ref 0.8–1.2)
Prothrombin Time: 13.3 seconds (ref 11.4–15.2)

## 2018-11-10 LAB — CDS SEROLOGY

## 2018-11-10 LAB — ETHANOL: Alcohol, Ethyl (B): 83 mg/dL — ABNORMAL HIGH (ref <10)

## 2018-11-10 MED ORDER — SUCCINYLCHOLINE CHLORIDE 20 MG/ML IJ SOLN
INTRAMUSCULAR | Status: AC | PRN
  Administered 2018-11-10: 150 mg via INTRAVENOUS

## 2018-11-10 MED ORDER — IOHEXOL 300 MG/ML  SOLN
100.0000 mL | Freq: Once | INTRAMUSCULAR | Status: AC | PRN
  Administered 2018-11-10: 100 mL via INTRAVENOUS

## 2018-11-10 MED ORDER — SODIUM CHLORIDE 0.9 % IV SOLN
INTRAVENOUS | Status: AC | PRN
  Administered 2018-11-10: 1000 mL via INTRAVENOUS

## 2018-11-10 MED ORDER — FENTANYL CITRATE (PF) 100 MCG/2ML IJ SOLN
50.0000 ug | Freq: Once | INTRAMUSCULAR | Status: AC
  Administered 2018-11-10: 50 ug via INTRAVENOUS
  Filled 2018-11-10: qty 2

## 2018-11-10 MED ORDER — FENTANYL BOLUS VIA INFUSION
50.0000 ug | INTRAVENOUS | Status: DC | PRN
  Filled 2018-11-10: qty 50

## 2018-11-10 MED ORDER — MORPHINE SULFATE (PF) 2 MG/ML IV SOLN
2.0000 mg | INTRAVENOUS | Status: DC | PRN
  Administered 2018-11-11: 4 mg via INTRAVENOUS
  Administered 2018-11-11: 2 mg via INTRAVENOUS
  Administered 2018-11-11 (×3): 4 mg via INTRAVENOUS
  Administered 2018-11-11: 2 mg via INTRAVENOUS
  Administered 2018-11-12 – 2018-11-13 (×9): 4 mg via INTRAVENOUS
  Filled 2018-11-10 (×10): qty 2
  Filled 2018-11-10: qty 1
  Filled 2018-11-10: qty 2
  Filled 2018-11-10: qty 1
  Filled 2018-11-10 (×2): qty 2

## 2018-11-10 MED ORDER — FENTANYL 2500MCG IN NS 250ML (10MCG/ML) PREMIX INFUSION
25.0000 ug/h | INTRAVENOUS | Status: DC
  Administered 2018-11-10: 50 ug/h via INTRAVENOUS
  Filled 2018-11-10: qty 250

## 2018-11-10 MED ORDER — ETOMIDATE 2 MG/ML IV SOLN
INTRAVENOUS | Status: AC | PRN
  Administered 2018-11-10: 20 mg via INTRAVENOUS

## 2018-11-10 MED ORDER — ACETAMINOPHEN 325 MG PO TABS
650.0000 mg | ORAL_TABLET | ORAL | Status: DC | PRN
  Administered 2018-11-13: 650 mg via ORAL
  Filled 2018-11-10: qty 2

## 2018-11-10 MED ORDER — ONDANSETRON 4 MG PO TBDP: 4.0000 mg | ORAL_TABLET | Freq: Four times a day (QID) | ORAL | Status: DC | PRN

## 2018-11-10 MED ORDER — PROPOFOL 1000 MG/100ML IV EMUL
0.0000 ug/kg/min | INTRAVENOUS | Status: DC
  Filled 2018-11-10: qty 100

## 2018-11-10 MED ORDER — ONDANSETRON HCL 4 MG/2ML IJ SOLN
4.0000 mg | Freq: Four times a day (QID) | INTRAMUSCULAR | Status: DC | PRN
  Administered 2018-11-11: 4 mg via INTRAVENOUS
  Filled 2018-11-10: qty 2

## 2018-11-10 MED ORDER — DOCUSATE SODIUM 100 MG PO CAPS
100.0000 mg | ORAL_CAPSULE | Freq: Two times a day (BID) | ORAL | Status: DC
  Administered 2018-11-12 – 2018-11-14 (×4): 100 mg via ORAL
  Filled 2018-11-10 (×6): qty 1

## 2018-11-10 NOTE — Progress Notes (Signed)
Received page from ED for updated contact information for PT. Contact was for Pt's wife [place]. [phone]. I called and left message to call the ED concerning the PT.
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
## 2018-11-11 ENCOUNTER — Inpatient Hospital Stay (HOSPITAL_COMMUNITY): Payer: 59

## 2018-11-11 LAB — PREPARE FRESH FROZEN PLASMA
Unit division: 0
Unit division: 0

## 2018-11-11 LAB — BPAM RBC
Blood Product Expiration Date: 202003262359
Blood Product Expiration Date: 202003262359
ISSUE DATE / TIME: 202002291151
ISSUE DATE / TIME: 202002291157
Unit Type and Rh: 5100
Unit Type and Rh: 5100

## 2018-11-11 LAB — TYPE AND SCREEN
ABO/RH(D): O POS
Antibody Screen: NEGATIVE
Unit division: 0
Unit division: 0

## 2018-11-11 LAB — BASIC METABOLIC PANEL
Anion gap: 12 (ref 5–15)
BUN: 12 mg/dL (ref 6–20)
CO2: 20 mmol/L — ABNORMAL LOW (ref 22–32)
Calcium: 9.2 mg/dL (ref 8.9–10.3)
Chloride: 107 mmol/L (ref 98–111)
Creatinine, Ser: 1.4 mg/dL — ABNORMAL HIGH (ref 0.61–1.24)
GFR calc Af Amer: >60 mL/min (ref >60)
GFR calc non Af Amer: >60 mL/min (ref >60)
Glucose, Bld: 128 mg/dL — ABNORMAL HIGH (ref 70–99)
Potassium: 3.3 mmol/L — ABNORMAL LOW (ref 3.5–5.1)
Sodium: 139 mmol/L (ref 135–145)

## 2018-11-11 LAB — BPAM FFP
Blood Product Expiration Date: 202003122359
Blood Product Expiration Date: 202003132359
ISSUE DATE / TIME: 202002282144
ISSUE DATE / TIME: 202002282144
Unit Type and Rh: 600
Unit Type and Rh: 6200

## 2018-11-11 LAB — LACTIC ACID, PLASMA: Lactic Acid, Venous: 2.4 mmol/L (ref 0.5–1.9)

## 2018-11-11 LAB — URINALYSIS, ROUTINE W REFLEX MICROSCOPIC
Bilirubin Urine: NEGATIVE
Glucose, UA: NEGATIVE mg/dL
Ketones, ur: 20 mg/dL — AB
Nitrite: NEGATIVE
Protein, ur: 30 mg/dL — AB
Specific Gravity, Urine: >1.046 — ABNORMAL HIGH (ref 1.005–1.030)
pH: 6 (ref 5.0–8.0)

## 2018-11-11 LAB — ABO/RH: ABO/RH(D): O POS

## 2018-11-11 LAB — CBC
HCT: 47.2 % (ref 39.0–52.0)
Hemoglobin: 15.4 g/dL (ref 13.0–17.0)
MCH: 28.5 pg (ref 26.0–34.0)
MCHC: 32.6 g/dL (ref 30.0–36.0)
MCV: 87.4 fL (ref 80.0–100.0)
Platelets: 264 10*3/uL (ref 150–400)
RBC: 5.4 MIL/uL (ref 4.22–5.81)
RDW: 13.2 % (ref 11.5–15.5)
WBC: 13.7 10*3/uL — ABNORMAL HIGH (ref 4.0–10.5)
nRBC: 0 % (ref 0.0–0.2)

## 2018-11-11 LAB — TRIGLYCERIDES: Triglycerides: 475 mg/dL — ABNORMAL HIGH (ref <150)

## 2018-11-11 LAB — HIV ANTIBODY (ROUTINE TESTING W REFLEX): HIV Screen 4th Generation wRfx: NONREACTIVE

## 2018-11-11 LAB — MRSA PCR SCREENING: MRSA by PCR: NEGATIVE

## 2018-11-11 MED ORDER — ALBUTEROL SULFATE (2.5 MG/3ML) 0.083% IN NEBU: 3.0000 mL | INHALATION_SOLUTION | Freq: Four times a day (QID) | RESPIRATORY_TRACT | Status: DC | PRN

## 2018-11-11 MED ORDER — ENOXAPARIN SODIUM 40 MG/0.4ML ~~LOC~~ SOLN
40.0000 mg | SUBCUTANEOUS | Status: DC
  Administered 2018-11-11 – 2018-11-13 (×3): 40 mg via SUBCUTANEOUS
  Filled 2018-11-11 (×3): qty 0.4

## 2018-11-11 MED ORDER — METHOCARBAMOL 750 MG PO TABS
750.0000 mg | ORAL_TABLET | Freq: Three times a day (TID) | ORAL | Status: DC | PRN
  Administered 2018-11-11 – 2018-11-12 (×4): 750 mg via ORAL
  Filled 2018-11-11 (×2): qty 2
  Filled 2018-11-11 (×2): qty 1

## 2018-11-11 MED ORDER — CHLORHEXIDINE GLUCONATE 0.12% ORAL RINSE (MEDLINE KIT)
15.0000 mL | Freq: Two times a day (BID) | OROMUCOSAL | Status: DC
  Administered 2018-11-11: 15 mL via OROMUCOSAL

## 2018-11-11 MED ORDER — ALBUTEROL SULFATE HFA 108 (90 BASE) MCG/ACT IN AERS: 1.0000 | INHALATION_SPRAY | Freq: Four times a day (QID) | RESPIRATORY_TRACT | Status: DC | PRN

## 2018-11-11 MED ORDER — ORAL CARE MOUTH RINSE
15.0000 mL | OROMUCOSAL | Status: DC
  Administered 2018-11-11 (×2): 15 mL via OROMUCOSAL

## 2018-11-11 MED ORDER — SODIUM CHLORIDE 0.9 % IV SOLN
INTRAVENOUS | Status: DC
  Filled 2018-11-11 (×4): qty 1000

## 2018-11-11 MED ORDER — ALBUTEROL SULFATE (2.5 MG/3ML) 0.083% IN NEBU
3.0000 mL | INHALATION_SOLUTION | Freq: Four times a day (QID) | RESPIRATORY_TRACT | Status: DC | PRN
  Administered 2018-11-12: 3 mL via RESPIRATORY_TRACT
  Filled 2018-11-11: qty 3

## 2018-11-11 MED ORDER — OXYCODONE HCL 5 MG PO TABS
5.0000 mg | ORAL_TABLET | ORAL | Status: DC | PRN
  Administered 2018-11-11 – 2018-11-14 (×12): 10 mg via ORAL
  Filled 2018-11-11 (×5): qty 2
  Filled 2018-11-11 (×2): qty 1
  Filled 2018-11-11 (×9): qty 2

## 2018-11-11 NOTE — Progress Notes (Signed)
Pt completely awake, impulsive, asking for endotracheal tube to be taken out.  Pt vomiting with OG tube suctioning contents of stomach.  Dr. Sheliah Hatch, Trauma on call, notified.  Orders given to extubate if appropriate, Respiratory notified.  Will continue to monitor.
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
## 2018-11-11 NOTE — Procedures (Addendum)
Extubation Procedure Note  Patient Details:   Name: Jay Gentry DOB: [birth year] MRN: [id number]   Airway Documentation:  Airway 7.5 mm (Active)  Secured at (cm) 24 cm 11/11/2018  3:23 AM  Measured From Lips 11/11/2018  3:23 AM  Secured Location Left 11/11/2018  3:23 AM  Secured By [employer] 11/11/2018  3:23 AM  Tube Holder Repositioned Yes 11/11/2018  3:23 AM  Cuff Pressure (cm H2O) 28 cm H2O 11/10/2018 10:15 PM  Site Condition Dry 11/11/2018  3:23 AM   Vent end date: (not recorded) Vent end time: (not recorded)   Evaluation  O2 sats: stable throughout Complications: No apparent complications Patient did tolerate procedure well. Bilateral Breath Sounds: Clear, Diminished   Yes   Patient extubated to 4 L [place] with no complications. Posotive cuff leak  [place] Setting 11/11/2018, 6:40 AM

## 2018-11-12 LAB — BASIC METABOLIC PANEL
Anion gap: 7 (ref 5–15)
BUN: 8 mg/dL (ref 6–20)
CO2: 27 mmol/L (ref 22–32)
Calcium: 8.8 mg/dL — ABNORMAL LOW (ref 8.9–10.3)
Chloride: 103 mmol/L (ref 98–111)
Creatinine, Ser: 1.34 mg/dL — ABNORMAL HIGH (ref 0.61–1.24)
GFR calc Af Amer: >60 mL/min (ref >60)
GFR calc non Af Amer: >60 mL/min (ref >60)
Glucose, Bld: 98 mg/dL (ref 70–99)
Potassium: 3.6 mmol/L (ref 3.5–5.1)
Sodium: 137 mmol/L (ref 135–145)

## 2018-11-12 NOTE — Progress Notes (Signed)
Pt transferred to 5n26. Report given to [facility]. Assessment stable. All belongings sent with pt. Wife notified of transfer

## 2018-11-12 NOTE — Progress Notes (Signed)
CSW administered SBIRT with patient. Patient scored an 8 on the SBIRT rating. CSW processed with patient his use and noted patient reported having 1-2 drinks 1-2 times a week and had spoken with his PCP about ceasing use. Per patient he reports having cut down drinking significantly and declines daily use, or any sort of cravings. CSW provided patient with substance use resources. CSW signing off at this time.  Tenna Delaine, LCSW, LCAS-A Clinical Social Worker II [phone]

## 2018-11-12 NOTE — Progress Notes (Signed)
1150 Received pt from 4N via wheelchair. A&O x4. Min assist with transfers, slow steady gait. C/o back and lower right rib cage pain, pain meds given as needed. Incentive spirometer given and used by pt.
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
## 2018-11-12 NOTE — Plan of Care (Signed)
  Problem: Education: Goal: Knowledge of General Education information will improve Description: Including pain rating scale, medication(s)/side effects and non-pharmacologic comfort measures Outcome: Progressing   Problem: Clinical Measurements: Goal: Ability to maintain clinical measurements within normal limits will improve Outcome: Progressing Goal: Respiratory complications will improve Outcome: Progressing   Problem: Activity: Goal: Risk for activity intolerance will decrease Outcome: Progressing   Problem: Nutrition: Goal: Adequate nutrition will be maintained Outcome: Progressing   Problem: Pain Managment: Goal: General experience of comfort will improve Outcome: Progressing   Problem: Safety: Goal: Ability to remain free from injury will improve Outcome: Progressing   

## 2018-11-12 NOTE — Plan of Care (Signed)

## 2018-11-13 ENCOUNTER — Encounter (HOSPITAL_COMMUNITY): Payer: Self-pay

## 2018-11-13 ENCOUNTER — Other Ambulatory Visit: Payer: Self-pay

## 2018-11-13 LAB — BASIC METABOLIC PANEL
Anion gap: 9 (ref 5–15)
BUN: 6 mg/dL (ref 6–20)
CO2: 27 mmol/L (ref 22–32)
Calcium: 9.3 mg/dL (ref 8.9–10.3)
Chloride: 100 mmol/L (ref 98–111)
Creatinine, Ser: 1.33 mg/dL — ABNORMAL HIGH (ref 0.61–1.24)
GFR calc Af Amer: >60 mL/min (ref >60)
GFR calc non Af Amer: >60 mL/min (ref >60)
Glucose, Bld: 139 mg/dL — ABNORMAL HIGH (ref 70–99)
Potassium: 3.3 mmol/L — ABNORMAL LOW (ref 3.5–5.1)
Sodium: 136 mmol/L (ref 135–145)

## 2018-11-13 MED ORDER — METOPROLOL TARTRATE 5 MG/5ML IV SOLN: 5.0000 mg | Freq: Four times a day (QID) | INTRAVENOUS | Status: DC | PRN

## 2018-11-13 MED ORDER — ACETAMINOPHEN 500 MG PO TABS
1000.0000 mg | ORAL_TABLET | Freq: Four times a day (QID) | ORAL | Status: DC
  Administered 2018-11-13 – 2018-11-14 (×5): 1000 mg via ORAL
  Filled 2018-11-13 (×2): qty 2

## 2018-11-13 MED ORDER — MORPHINE SULFATE (PF) 2 MG/ML IV SOLN
1.0000 mg | INTRAVENOUS | Status: DC | PRN
  Administered 2018-11-14: 2 mg via INTRAVENOUS
  Filled 2018-11-13: qty 1

## 2018-11-13 MED ORDER — POTASSIUM CHLORIDE CRYS ER 20 MEQ PO TBCR
20.0000 meq | EXTENDED_RELEASE_TABLET | Freq: Three times a day (TID) | ORAL | Status: DC
  Administered 2018-11-13 – 2018-11-14 (×3): 20 meq via ORAL
  Filled 2018-11-13 (×3): qty 1

## 2018-11-13 MED ORDER — METHOCARBAMOL 750 MG PO TABS
750.0000 mg | ORAL_TABLET | Freq: Three times a day (TID) | ORAL | Status: DC
  Administered 2018-11-13 – 2018-11-14 (×4): 750 mg via ORAL
  Filled 2018-11-13 (×4): qty 1
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
## 2018-11-13 NOTE — Plan of Care (Signed)
  Problem: Clinical Measurements: Goal: Ability to maintain clinical measurements within normal limits will improve Outcome: Progressing Goal: Will remain free from infection Outcome: Progressing Goal: Cardiovascular complication will be avoided Outcome: Progressing   Problem: Activity: Goal: Risk for activity intolerance will decrease Outcome: Progressing   
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
## 2018-11-13 NOTE — Discharge Instructions (Signed)
Transverse Process Fracture  Bones of the spine (vertebrae) have portions that extend off to either side of the spine. These portions of bone are called transverse processes. A transverse process fracture, which is also called a rotation spine fracture, is a break in a transverse process. What are the causes? This condition may be caused by:  A fall from a great height.  A car accident.  A sports injury.  A gunshot wound.  A hard, direct hit to the back. This kind of fracture often results from a sudden and severe bending of the spine to one side. Depending on the cause of the fracture, one or more bones may be affected. What increases the risk? You are more likely to develop this condition if:  You have thinning and loss of density in the bones (osteoporosis).  You play a contact sport. What are the signs or symptoms? The main symptom of this condition is back pain. The pain may:  Be felt on the side of the spine (flank) where the fracture is.  Get worse when you move or take a deep breath. How is this diagnosed? This condition may be diagnosed based on:  Your symptoms.  Your medical history.  A physical exam. You may also have other tests, including:  X-rays.  A CT scan.  MRI. How is this treated? Most transverse process fractures heal on their own with time and rest. Treatment may involve supportive care, such as:  Limiting activity.  Medicines, such as: ? Pain medicine. ? Muscle-relaxing medicine.  Physical therapy.  A neck or back brace. Follow these instructions at home: If you have a brace:  Wear the neck or back brace as told by your health care provider. Remove it only as told by your health care provider.  Keep the brace clean.  If the brace is not waterproof: ? Do not let it get wet. ? Cover it with a watertight covering when you take a bath or a shower. Managing pain, stiffness, and swelling   If directed, put ice on the injured  area: ? If you have a removable brace, remove it as told by your health care provider. ? Put ice in a plastic bag. ? Place a towel between your skin and the bag. ? Leave the ice on for 20 minutes, 2-3 times a day. Medicines  Take over-the-counter and prescription medicines only as told by your health care provider.  Do not drive or use heavy machinery while taking prescription pain medicine.  If you are taking prescription pain medicine, take actions to prevent or treat constipation. Your health care provider may recommend that you: ? Drink enough fluid to keep your urine pale yellow. ? Eat foods that are high in fiber, such as fresh fruits and vegetables, whole grains, and beans. ? Limit foods that are high in fat and processed sugars, such as fried or sweet foods. ? Take an over-the-counter or prescription medicine for constipation. Activity  Stay in bed (on bed rest) only as directed by your health care provider. ? Avoid being in bed for a long time without moving. Get up to take short walks every 1-2 hours. This is important to improve blood flow and breathing. Ask for help if you feel weak or unsteady.  Return to your normal activities when your health care provider says it is okay. Ask if there are any activities that you should not do.  Do physical therapy exercises as recommended by your health care provider. General instructions  Do not use any products that contain nicotine or tobacco, such as cigarettes and e-cigarettes. These can delay bone healing. If you need help quitting, ask your health care provider.  Keep all follow-up visits as told by your health care provider. This is important. Visits can help to prevent permanent injury, disability, and long-lasting (chronic) pain. Contact a health care provider if:  You have a fever.  You develop a cough that makes your pain worse.  Your pain medicine is not helping.  Your pain does not get better over time.  You cannot  return to your normal activities as planned or expected. Get help right away if:  Your pain is very bad and it suddenly gets worse.  You are unable to move any body part (paralysis) that is below the level of your injury.  You have numbness, tingling, or weakness in any body part that is below the level of your injury.  You cannot control your bladder or bowels. Summary  A transverse process fracture is a break in the portion of the bone that extends to the side of the spine.  Most transverse process fractures heal on their own with time and rest.  You may also have supportive treatments such as a back brace, pain medicines, and physical therapy.  Keep all follow-up visits. This is important and will help to prevent permanent injury, disability, and long-lasting (chronic) pain. This information is not intended to replace advice given to you by your health care provider. Make sure you discuss any questions you have with your health care provider. Document Released: 12/15/2006 Document Revised: 10/12/2017 Document Reviewed: 10/12/2017 Elsevier Interactive Patient Education  2019 [employer]   Muscle Cramps and Spasms Muscle cramps and spasms are when muscles tighten by themselves. They usually get better within minutes. Muscle cramps are painful. They are usually stronger and last longer than muscle spasms. Muscle spasms may or may not be painful. They can last a few seconds or much longer. Cramps and spasms can affect any muscle, but they occur most often in the calf muscles of the leg. They are usually not caused by a serious problem. In many cases, the cause is not known. Some common causes include:  Doing more physical work or exercise than your body is ready for.  Using the muscles too much (overuse) by repeating certain movements too many times.  Staying in a certain position for a long time.  Playing a sport or doing an activity without preparing properly.  Using bad form  or technique while playing a sport or doing an activity.  Not having enough water in your body (dehydration).  Injury.  Side effects of some medicines.  Low levels of the salts and minerals in your blood (electrolytes), such as low potassium or calcium. Follow these instructions at home: Managing pain and stiffness      Massage, stretch, and relax the muscle. Do this for many minutes at a time.  If told, put heat on tight or tense muscles as often as told by your doctor. Use the heat source that your doctor recommends, such as a moist heat pack or a heating pad. ? Place a towel between your skin and the heat source. ? Leave the heat on for 20-30 minutes. ? Remove the heat if your skin turns bright red. This is very important if you are not able to feel pain, heat, or cold. You may have a greater risk of getting burned.  If told, put ice on  the affected area. This may help if you are sore or have pain after a cramp or spasm. ? Put ice in a plastic bag. ? Place a towel between your skin and the bag. ? Leave the ice on for 20 minutes, 2-3 times a day.  Try taking hot showers or baths to help relax tight muscles. Eating and drinking  Drink enough fluid to keep your pee (urine) pale yellow.  Eat a healthy diet to help ensure that your muscles work well. This should include: ? Fruits and vegetables. ? Lean protein. ? Whole grains. ? Low-fat or nonfat dairy products. General instructions  If you are having cramps often, avoid intense exercise for several days.  Take over-the-counter and prescription medicines only as told by your doctor.  Watch for any changes in your symptoms.  Keep all follow-up visits as told by your doctor. This is important. Contact a doctor if:  Your cramps or spasms get worse or happen more often.  Your cramps or spasms do not get better with time. Summary  Muscle cramps and spasms are when muscles tighten by themselves. They usually get better  within minutes.  Cramps and spasms occur most often in the calf muscles of the leg.  Massage, stretch, and relax the muscle. This may help the cramp or spasm go away.  Drink enough fluid to keep your pee (urine) pale yellow. This information is not intended to replace advice given to you by your health care provider. Make sure you discuss any questions you have with your health care provider. Document Released: 08/12/2008 Document Revised: 01/23/2018 Document Reviewed: 01/23/2018 Elsevier Interactive Patient Education  2019 [employer].

## 2018-11-14 ENCOUNTER — Encounter (HOSPITAL_COMMUNITY): Payer: Self-pay

## 2018-11-14 LAB — BASIC METABOLIC PANEL
Anion gap: 10 (ref 5–15)
BUN: 12 mg/dL (ref 6–20)
CO2: 25 mmol/L (ref 22–32)
Calcium: 9.3 mg/dL (ref 8.9–10.3)
Chloride: 103 mmol/L (ref 98–111)
Creatinine, Ser: 1.33 mg/dL — ABNORMAL HIGH (ref 0.61–1.24)
GFR calc Af Amer: >60 mL/min (ref >60)
GFR calc non Af Amer: >60 mL/min (ref >60)
Glucose, Bld: 115 mg/dL — ABNORMAL HIGH (ref 70–99)
Potassium: 3.6 mmol/L (ref 3.5–5.1)
Sodium: 138 mmol/L (ref 135–145)

## 2018-11-14 LAB — BLOOD PRODUCT ORDER (VERBAL) VERIFICATION

## 2018-11-14 MED ORDER — IBUPROFEN 200 MG PO TABS: 400.0000 mg | ORAL_TABLET | Freq: Four times a day (QID) | ORAL | Status: AC | PRN

## 2018-11-14 MED ORDER — OXYCODONE HCL 5 MG PO TABS: 5.0000 mg | ORAL_TABLET | Freq: Four times a day (QID) | ORAL | 0 refills | Status: DC | PRN

## 2018-11-14 MED ORDER — ACETAMINOPHEN 500 MG PO TABS: 1000.0000 mg | ORAL_TABLET | Freq: Three times a day (TID) | ORAL | Status: AC | PRN

## 2018-11-14 MED ORDER — FLUTICASONE PROPIONATE 50 MCG/ACT NA SUSP: 1.0000 | Freq: Every day | NASAL | Status: DC

## 2018-11-14 MED ORDER — METHOCARBAMOL 750 MG PO TABS: 750.0000 mg | ORAL_TABLET | Freq: Three times a day (TID) | ORAL | 0 refills | Status: DC | PRN
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
## 2018-11-15 ENCOUNTER — Inpatient Hospital Stay: Payer: 59 | Admitting: Nurse Practitioner

## 2018-11-15 ENCOUNTER — Telehealth: Payer: Self-pay | Admitting: *Deleted

## 2018-11-15 NOTE — Telephone Encounter (Signed)
Tried calling pt to confirm hosp f/u that has been made for today 3/4 @ 3:00. Per TCM report pt was d/c yesterday from hosp. Left msg on phone to RTC.Marland KitchenRaechel Chute

## 2018-11-16 NOTE — Telephone Encounter (Signed)
Per chart pt no-showed for appt yesterday. Called pt again to follow-up there was no answer LMOM RTC.Marland KitchenRaechel Chute

## 2018-11-17 NOTE — Telephone Encounter (Signed)
Pt still have not return call back to make appt. Closing encounter../lmb 

## 2018-11-20 ENCOUNTER — Encounter: Payer: 59 | Admitting: Nurse Practitioner

## 2018-11-23 ENCOUNTER — Other Ambulatory Visit (INDEPENDENT_AMBULATORY_CARE_PROVIDER_SITE_OTHER): Payer: 59

## 2018-11-23 ENCOUNTER — Ambulatory Visit (INDEPENDENT_AMBULATORY_CARE_PROVIDER_SITE_OTHER)
Admission: RE | Admit: 2018-11-23 | Discharge: 2018-11-23 | Disposition: A | Discharge status: Home / Self Care | Payer: 59 | Source: Ambulatory Visit | Attending: Nurse Practitioner | Admitting: Nurse Practitioner

## 2018-11-23 ENCOUNTER — Other Ambulatory Visit: Payer: Self-pay

## 2018-11-23 ENCOUNTER — Encounter: Payer: Self-pay | Admitting: Nurse Practitioner

## 2018-11-23 ENCOUNTER — Ambulatory Visit (INDEPENDENT_AMBULATORY_CARE_PROVIDER_SITE_OTHER): Payer: 59 | Admitting: Nurse Practitioner

## 2018-11-23 DIAGNOSIS — R7989 Other specified abnormal findings of blood chemistry: Secondary | ICD-10-CM

## 2018-11-23 DIAGNOSIS — J9690 Respiratory failure, unspecified, unspecified whether with hypoxia or hypercapnia: Secondary | ICD-10-CM

## 2018-11-23 DIAGNOSIS — H938X2 Other specified disorders of left ear: Secondary | ICD-10-CM | POA: Diagnosis not present

## 2018-11-23 DIAGNOSIS — M549 Dorsalgia, unspecified: Secondary | ICD-10-CM

## 2018-11-23 LAB — BASIC METABOLIC PANEL
BUN: 15 mg/dL (ref 6–23)
CO2: 22 mEq/L (ref 19–32)
Calcium: 10 mg/dL (ref 8.4–10.5)
Chloride: 97 mEq/L (ref 96–112)
Creatinine, Ser: 1.47 mg/dL (ref 0.40–1.50)
GFR: 63.45 mL/min (ref >60.00)
Glucose, Bld: 92 mg/dL (ref 70–99)
Potassium: 3.9 mEq/L (ref 3.5–5.1)
Sodium: 136 mEq/L (ref 135–145)

## 2018-11-23 MED ORDER — FLUTICASONE PROPIONATE 50 MCG/ACT NA SUSP: 2.0000 | Freq: Every day | NASAL | 1 refills | Status: DC

## 2018-11-23 NOTE — Patient Instructions (Addendum)
Start flonase nasal -2 sprays in each nostril daily then reduce to 1 spray in each nostril daily when your symptoms improve Start claritin once daily Follow up if you continue to have popping/pressure in your ear  Chest xray and labs downstairs today  Please schedule a follow up appointment for further evaluation of back pain here with Dr Katrinka Blazing or Dr Jordan Likes, our sports medicine providers
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
## 2018-11-24 ENCOUNTER — Telehealth: Payer: Self-pay | Admitting: Emergency Medicine

## 2018-11-24 NOTE — Telephone Encounter (Signed)
FMLA started and given to provider for completion and signature. Please return to [facility] when complete.

## 2018-11-29 ENCOUNTER — Ambulatory Visit: Payer: 59 | Admitting: Family Medicine

## 2018-12-01 DIAGNOSIS — Z0279 Encounter for issue of other medical certificate: Secondary | ICD-10-CM

## 2018-12-01 NOTE — Telephone Encounter (Signed)
Completed & signed, Faxed to [phone], Copy sent to scan &charged for.   Patient informed and original mailed to patient.

## 2018-12-04 ENCOUNTER — Telehealth: Payer: Self-pay | Admitting: Nurse Practitioner

## 2018-12-04 ENCOUNTER — Other Ambulatory Visit: Payer: Self-pay | Admitting: Nurse Practitioner

## 2018-12-04 MED ORDER — METHOCARBAMOL 750 MG PO TABS: 750.0000 mg | ORAL_TABLET | Freq: Three times a day (TID) | ORAL | 0 refills | Status: DC | PRN

## 2018-12-04 NOTE — Telephone Encounter (Signed)
Okay to refill robaxin Please have him schedule follow up with sports medicine for further management of his back pain

## 2018-12-04 NOTE — Telephone Encounter (Signed)
I called pt- he states he is still having back pain where his fractures are. He states the pharmacy advised him they were sending a request over for his Robaxin. I advised him we had not received a request for this yet.  1. Ok to Rf robaxin?  2. He also wants PCP to advise what else he can do for his pain and to help his healing process. He states he is ordering a back brace.

## 2018-12-04 NOTE — Telephone Encounter (Signed)
Pt informed of below. I advised him to try to rest at home, treat his pain and sxs with OTC meds that he normally uses, take his Robaxin as prescribed and to call our office once the COVID-19 pandemic has passed if he is still experiencing back pain to schedule with Dr. Katrinka Blazing or Jordan Likes.  Patient verbalized understanding.

## 2018-12-04 NOTE — Telephone Encounter (Signed)
Patient would like a call back to discuss pain medications and his robaxin prescription. No call back number left.

## 2018-12-04 NOTE — Addendum Note (Signed)
Addended by: Merrilyn Puma on: 12/04/2018 01:52 PM   Modules accepted: Orders

## 2019-01-15 ENCOUNTER — Telehealth: Payer: Self-pay | Admitting: Nurse Practitioner

## 2019-01-15 NOTE — Telephone Encounter (Signed)
-----   Message from Corwin Levins, MD sent at 01/13/2019  2:44 PM EDT ----- Regarding: transfer of care Ms Jay Gentry,  Please contact this patient, and ask them to schedule with me as new PCP asap (even same day if possible) when you are able to address this, in order for the patient to continue have updated ongoing care without being lost in the system, and to keep up a high standard of care at [facility].  This visit would be for "office visit" to f/u general medical conditions (such as Diabetics who are due for f/u A1c or other chronic medical problem) or could be CPX if the patient does not necessarily need specific chronic condition follow up and wants this.     These visits can be In Person (if no fever symptoms), or Virtual.  I would prefer in person during day hours at the office, but either is ok.  Exceptions can be made to scheduling of currently ill patients in person who happen to have a current issue such as feeling feverish and UTI symptoms or other infection (just not anything related to possible COVID19 symptoms)  Please be aware I would like 4 Doxy (virtual) visits if possible each Mon/Tues/Wed/Thur evenings that I can do from home.  The template has been changed to reflect the ability to do this, and I will just need to be aware to do this by watching my schedule.

## 2019-01-15 NOTE — Telephone Encounter (Signed)
Patient has been scheduled for a CPE.

## 2019-02-09 ENCOUNTER — Telehealth: Payer: Self-pay | Admitting: Internal Medicine

## 2019-02-09 MED ORDER — ALBUTEROL SULFATE HFA 108 (90 BASE) MCG/ACT IN AERS: INHALATION_SPRAY | RESPIRATORY_TRACT | 0 refills | Status: DC

## 2019-02-09 NOTE — Telephone Encounter (Signed)
Copied from CRM [id number]. Topic: Quick Communication - Rx Refill/Question >> Feb 09, 2019  1:48 PM Jens Som A wrote: Medication: albuterol (PROVENTIL HFA;VENTOLIN HFA) 108 (90 Base) MCG/ACT inhaler [[id number]]   Has the patient contacted their pharmacy? Yes  (Agent: If no, request that the patient contact the pharmacy for the refill.) (Agent: If yes, when and what did the pharmacy advise?)  Preferred Pharmacy (with phone number or street name):  [id number] [place], [place] - [address] AT [facility] OF [facility] & [address] [phone] (Phone) [phone] (Fax)     Agent: Please be advised that RX refills may take up to 3 business days. We ask that you follow-up with your pharmacy.

## 2019-02-12 ENCOUNTER — Encounter: Payer: Self-pay | Admitting: Internal Medicine

## 2019-02-12 ENCOUNTER — Ambulatory Visit (INDEPENDENT_AMBULATORY_CARE_PROVIDER_SITE_OTHER): Payer: 59 | Admitting: Internal Medicine

## 2019-02-12 DIAGNOSIS — Z0001 Encounter for general adult medical examination with abnormal findings: Secondary | ICD-10-CM | POA: Insufficient documentation

## 2019-02-12 DIAGNOSIS — I10 Essential (primary) hypertension: Secondary | ICD-10-CM

## 2019-02-12 DIAGNOSIS — R739 Hyperglycemia, unspecified: Secondary | ICD-10-CM

## 2019-02-12 DIAGNOSIS — H538 Other visual disturbances: Secondary | ICD-10-CM | POA: Diagnosis not present

## 2019-02-12 DIAGNOSIS — J4540 Moderate persistent asthma, uncomplicated: Secondary | ICD-10-CM | POA: Diagnosis not present

## 2019-02-12 DIAGNOSIS — Z720 Tobacco use: Secondary | ICD-10-CM | POA: Insufficient documentation

## 2019-02-12 DIAGNOSIS — E538 Deficiency of other specified B group vitamins: Secondary | ICD-10-CM | POA: Diagnosis not present

## 2019-02-12 DIAGNOSIS — E559 Vitamin D deficiency, unspecified: Secondary | ICD-10-CM | POA: Diagnosis not present

## 2019-02-12 DIAGNOSIS — H938X2 Other specified disorders of left ear: Secondary | ICD-10-CM

## 2019-02-12 DIAGNOSIS — Z0000 Encounter for general adult medical examination without abnormal findings: Secondary | ICD-10-CM

## 2019-02-12 DIAGNOSIS — E611 Iron deficiency: Secondary | ICD-10-CM

## 2019-02-12 MED ORDER — BREO ELLIPTA 100-25 MCG/INH IN AEPB: 1.0000 | INHALATION_SPRAY | Freq: Every day | RESPIRATORY_TRACT | 3 refills | Status: DC

## 2019-02-12 MED ORDER — VARENICLINE TARTRATE 1 MG PO TABS: 1.0000 mg | ORAL_TABLET | Freq: Two times a day (BID) | ORAL | 1 refills | Status: DC

## 2019-02-12 MED ORDER — ALBUTEROL SULFATE HFA 108 (90 BASE) MCG/ACT IN AERS: INHALATION_SPRAY | RESPIRATORY_TRACT | 11 refills | Status: DC

## 2019-02-12 MED ORDER — VARENICLINE TARTRATE 0.5 MG X 11 & 1 MG X 42 PO MISC: ORAL | 0 refills | Status: DC

## 2019-02-12 MED ORDER — AMLODIPINE BESYLATE 5 MG PO TABS: 5.0000 mg | ORAL_TABLET | Freq: Every day | ORAL | 3 refills | Status: DC

## 2019-02-12 MED ORDER — FLUTICASONE PROPIONATE 50 MCG/ACT NA SUSP: 2.0000 | Freq: Every day | NASAL | 5 refills | Status: DC

## 2019-02-12 MED ORDER — ALBUTEROL SULFATE (2.5 MG/3ML) 0.083% IN NEBU: 2.5000 mg | INHALATION_SOLUTION | RESPIRATORY_TRACT | 11 refills | Status: DC | PRN

## 2019-02-12 MED ORDER — TIOTROPIUM BROMIDE MONOHYDRATE 2.5 MCG/ACT IN AERS: 2.0000 | INHALATION_SPRAY | Freq: Every day | RESPIRATORY_TRACT | 3 refills | Status: DC

## 2019-02-12 NOTE — Assessment & Plan Note (Signed)
Encouraged pt to continue to monitor the BP at home with < 140/90

## 2019-02-12 NOTE — Assessment & Plan Note (Addendum)
Etiology unclear, for glucose with labs, also refer opthomology - ? Recent onset presbyopia, consider reading glasses  In addition to the time spent performing CPE, I spent an additional 25 minutes face to face,in which greater than 50% of this time was spent in counseling and coordination of care for patient's acute illness as documented, including the differential dx, treatment, further evaluation and other management of bilateral blurred vision, HTN, Hyperglycemia, asthma, tobacco abuse

## 2019-02-12 NOTE — Assessment & Plan Note (Signed)
stable overall by history and exam, recent data reviewed with pt, and pt to continue medical treatment as before,  to f/u any worsening symptoms or concerns  

## 2019-02-12 NOTE — Patient Instructions (Signed)
Please take all new medication as prescribed - the chantix to help quit smoking  Please continue all other medications as before, and refills have been done if requested.  Please have the pharmacy call with any other refills you may need.  Please continue your efforts at being more active, low cholesterol diet, and weight control.  You are otherwise up to date with prevention measures today.  Please keep your appointments with your specialists as you may have planned  You will be contacted regarding the referral for: Eye doctor  Please go to the LAB in the Basement (turn left off the elevator) for the tests to be done tomorrow as you mentioned  You will be contacted by phone if any changes need to be made immediately.  Otherwise, you will receive a letter about your results with an explanation, but please check with MyChart first.  Please remember to sign up for MyChart if you have not done so, as this will be important to you in the future with finding out test results, communicating by private email, and scheduling acute appointments online when needed.  Please return in 1 year for your yearly visit, or sooner if needed, with Lab testing done 3-5 days before

## 2019-02-12 NOTE — Progress Notes (Signed)
Patient ID: Jay Gentry, male   DOB: 08-11-1976, 43 y.o.   MRN: [id number]  Virtual Visit via Video Note  I connected with Jay Gentry on 02/12/19 at  2:40 PM EDT by a video enabled telemedicine application and verified that I am speaking with the correct person using two identifiers.  Location: Patient: at home Provider: at office   I discussed the limitations of evaluation and management by telemedicine and the availability of in person appointments. The patient expressed understanding and agreed to proceed.  History of Present Illness: Here for wellness and establish with new PCP;  Overall doing ok;  Pt denies Chest pain, worsening SOB, DOE, wheezing, orthopnea, PND, worsening LE edema, palpitations, dizziness or syncope.  Pt denies neurological change such as new headache, facial or extremity weakness.  Pt denies polydipsia, polyuria, or low sugar symptoms. Pt states overall good compliance with treatment and medications, good tolerability, and has been trying to follow appropriate diet.  Pt denies worsening depressive symptoms, suicidal ideation or panic. No fever, night sweats, wt loss, loss of appetite, or other constitutional symptoms.  Pt states good ability with ADL's, has low fall risk, home safety reviewed and adequate, no other significant changes in hearing or vision, and only occasionally active with exercise.  BP at home < 140/90.  Also, pt is ready for quitting smoking, asks for chantix, has not tried before, but no recent significant emotional issues.   Also with c/o bilateral blurred vision x 2 wks, mild persistent constant, without pain, HA, hearing loss or other CN related symptoms.  Worse to seeing up close. Nothing else seems better or worse Past Medical History:  Diagnosis Date  . Asthma   . Bronchitis   . Hypertension   . Perirectal abscess 05/14/2015   Past Surgical History:  Procedure Laterality Date  . INCISION AND DRAINAGE PERIRECTAL ABSCESS   05/14/2015  . INCISION AND DRAINAGE PERIRECTAL ABSCESS N/A 05/14/2015   Procedure: DRAINAGE PERIRECTAL ABSCESS;  Surgeon: Manus Rudd, MD;  Location: [facility] OR;  Service: General;  Laterality: N/A;  . INCISION AND DRAINAGE PERIRECTAL ABSCESS N/A 11/09/2016   Procedure: IRRIGATION AND DEBRIDEMENT PERIRECTAL ABSCESS;  Surgeon: Glenna Fellows, MD;  Location: [facility];  Service: General;  Laterality: N/A;    reports that he has never smoked. He has never used smokeless tobacco. He reports current alcohol use. He reports previous drug use. family history includes Asthma in his mother. Allergies  Allergen Reactions  . Penicillins Hives    Has patient had a PCN reaction causing immediate rash, facial/tongue/throat swelling, SOB or lightheadedness with hypotension: yes Has patient had a PCN reaction causing severe rash involving mucus membranes or skin necrosis: no Has patient had a PCN reaction that required hospitalization no Has patient had a PCN reaction occurring within the last 10 years: no If all of the above answers are "NO", then may proceed with Cephalosporin use.   Marland Kitchen Penicillins Hives    Did it involve swelling of the face/tongue/throat, SOB, or low BP? No Did it involve sudden or severe rash/hives, skin peeling, or any reaction on the inside of your mouth or nose?  Did you need to seek medical attention at a hospital or doctor's office? Unknown When did it last happen?childhood If all above answers are "NO", may proceed with cephalosporin use.   Current Outpatient Medications on File Prior to Visit  Medication Sig Dispense Refill  . acetaminophen (TYLENOL) 325 MG tablet You can take plain Tylenol, 2 tablets every 6 hours  as needed for pain.    Marland Kitchen. acetaminophen (TYLENOL) 500 MG tablet Take 2 tablets (1,000 mg total) by mouth every 8 (eight) hours as needed for mild pain or headache.    . diphenhydrAMINE (BENADRYL) 25 mg capsule Take 25 mg by mouth every 6 (six) hours as needed for  allergies.    Marland Kitchen. ibuprofen (MOTRIN IB) 200 MG tablet Take 2 tablets (400 mg total) by mouth every 6 (six) hours as needed for mild pain.     No current facility-administered medications on file prior to visit.    Observations/Objective: Alert, NAD, appropriate mood and affect, resps normal, cn 2-12 intact, moves all 4s, no visible rash or swelling Lab Results  Component Value Date   WBC 13.7 (H) 11/11/2018   HGB 15.4 11/11/2018   HCT 47.2 11/11/2018   PLT 264 11/11/2018   GLUCOSE 92 11/23/2018   TRIG 475 (H) 11/10/2018   ALT 44 11/10/2018   AST 34 11/10/2018   NA 136 11/23/2018   K 3.9 11/23/2018   CL 97 11/23/2018   CREATININE 1.47 11/23/2018   BUN 15 11/23/2018   CO2 22 11/23/2018   TSH 1.44 05/04/2018   INR 1.0 11/10/2018   HGBA1C 6.0 05/22/2018   Assessment and Plan: See notes  Follow Up Instructions: See notes   I discussed the assessment and treatment plan with the patient. The patient was provided an opportunity to ask questions and all were answered. The patient agreed with the plan and demonstrated an understanding of the instructions.   The patient was advised to call back or seek an in-person evaluation if the symptoms worsen or if the condition fails to improve as anticipated.  Oliver BarreJames Clarrisa Kaylor, MD

## 2019-02-12 NOTE — Assessment & Plan Note (Signed)
Ok for chantix asd,  to f/u any worsening symptoms or concerns 

## 2019-02-12 NOTE — Assessment & Plan Note (Signed)

## 2019-02-12 NOTE — Assessment & Plan Note (Signed)
stable overall by history and exam, recent data reviewed with pt, and pt to continue medical treatment as before,  to f/u any worsening symptoms or concerns, for a1c with labs 

## 2019-03-22 ENCOUNTER — Telehealth: Payer: Self-pay | Admitting: Internal Medicine

## 2019-03-22 MED ORDER — ALBUTEROL SULFATE HFA 108 (90 BASE) MCG/ACT IN AERS: INHALATION_SPRAY | RESPIRATORY_TRACT | 11 refills | Status: DC

## 2019-03-22 NOTE — Telephone Encounter (Signed)
Medication Refill - Medication: albuterol (VENTOLIN HFA) 108 (90 Base) MCG/ACT inhaler    Has the patient contacted their pharmacy? Yes.   (Agent: If no, request that the patient contact the pharmacy for the refill.) (Agent: If yes, when and what did the pharmacy advise?)request sent with no response   Preferred Pharmacy (with phone number or street name):  [facility] [place], [place] [phone] (Phone) [phone] (Fax)     Agent: Please be advised that RX refills may take up to 3 business days. We ask that you follow-up with your pharmacy.

## 2019-11-28 ENCOUNTER — Other Ambulatory Visit: Payer: Self-pay | Admitting: Internal Medicine

## 2019-11-28 NOTE — Telephone Encounter (Signed)
New Message:  1.Medication Requested: albuterol (VENTOLIN HFA) 108 (90 Base) MCG/ACT inhaler 2. Pharmacy (Name, Street, [place]): [facility] [phone] - [place], [place] - [address] AT [facility] OF [address] 3. On Med List: yes  4. Last Visit with PCP: 02/12/19  5. Next visit date with PCP: None   Agent: Please be advised that RX refills may take up to 3 business days. We ask that you follow-up with your pharmacy.

## 2019-11-29 ENCOUNTER — Other Ambulatory Visit: Payer: Self-pay

## 2019-11-29 MED ORDER — ALBUTEROL SULFATE HFA 108 (90 BASE) MCG/ACT IN AERS: INHALATION_SPRAY | RESPIRATORY_TRACT | 11 refills | Status: DC

## 2019-11-29 NOTE — Telephone Encounter (Signed)
Sent in today 

## 2019-12-06 ENCOUNTER — Other Ambulatory Visit: Payer: Self-pay | Admitting: Internal Medicine

## 2020-02-25 ENCOUNTER — Telehealth: Payer: Self-pay | Admitting: Internal Medicine

## 2020-02-25 NOTE — Telephone Encounter (Signed)
New message:    1.Medication Requested: albuterol (PROVENTIL) (2.5 MG/3ML) 0.083% nebulizer solution albuterol (VENTOLIN HFA) 108 (90 Base) MCG/ACT inhaler 2. Pharmacy (Name, Street, [place]): [facility] [phone] - [place], [place] - [address] AT [facility] OF [address] 3. On Med List: Yes  4. Last Visit with PCP: 02/12/19  5. Next visit date with PCP:   Agent: Please be advised that RX refills may take up to 3 business days. We ask that you follow-up with your pharmacy.

## 2020-02-26 ENCOUNTER — Other Ambulatory Visit: Payer: Self-pay

## 2020-02-26 MED ORDER — ALBUTEROL SULFATE (2.5 MG/3ML) 0.083% IN NEBU: 2.5000 mg | INHALATION_SOLUTION | RESPIRATORY_TRACT | 11 refills | Status: DC | PRN

## 2020-02-26 MED ORDER — ALBUTEROL SULFATE HFA 108 (90 BASE) MCG/ACT IN AERS: INHALATION_SPRAY | RESPIRATORY_TRACT | 2 refills | Status: DC

## 2020-04-06 ENCOUNTER — Other Ambulatory Visit: Payer: Self-pay | Admitting: Internal Medicine

## 2020-04-06 DIAGNOSIS — I10 Essential (primary) hypertension: Secondary | ICD-10-CM

## 2020-04-06 NOTE — Telephone Encounter (Signed)
Please refill as per office routine med refill policy (all routine meds refilled for 3 mo or monthly per pt preference up to one year from last visit, then month to month grace period for 3 mo, then further med refills will have to be denied)  

## 2020-05-30 ENCOUNTER — Other Ambulatory Visit: Payer: Self-pay | Admitting: Internal Medicine

## 2020-05-30 NOTE — Telephone Encounter (Signed)
Please refill as per office routine med refill policy (all routine meds refilled for 3 mo or monthly per pt preference up to one year from last visit, then month to month grace period for 3 mo, then further med refills will have to be denied)  

## 2020-06-18 ENCOUNTER — Telehealth (INDEPENDENT_AMBULATORY_CARE_PROVIDER_SITE_OTHER): Payer: Self-pay | Admitting: Family

## 2020-06-18 ENCOUNTER — Other Ambulatory Visit: Payer: Self-pay

## 2020-06-18 ENCOUNTER — Other Ambulatory Visit: Payer: Self-pay | Admitting: Family

## 2020-06-18 DIAGNOSIS — J4540 Moderate persistent asthma, uncomplicated: Secondary | ICD-10-CM

## 2020-06-18 DIAGNOSIS — I10 Essential (primary) hypertension: Secondary | ICD-10-CM

## 2020-06-18 MED ORDER — ALBUTEROL SULFATE HFA 108 (90 BASE) MCG/ACT IN AERS: INHALATION_SPRAY | RESPIRATORY_TRACT | 0 refills | Status: DC
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
## 2020-07-04 ENCOUNTER — Inpatient Hospital Stay (HOSPITAL_COMMUNITY)
Admission: EM | Admit: 2020-07-04 | Discharge: 2020-07-05 | DRG: 369 | Disposition: A | Discharge status: Home / Self Care | Payer: Self-pay | Attending: Internal Medicine | Admitting: Internal Medicine

## 2020-07-04 ENCOUNTER — Inpatient Hospital Stay (HOSPITAL_COMMUNITY): Payer: Self-pay

## 2020-07-04 ENCOUNTER — Encounter (HOSPITAL_COMMUNITY): Payer: Self-pay

## 2020-07-04 ENCOUNTER — Encounter: Payer: Self-pay | Admitting: Internal Medicine

## 2020-07-04 ENCOUNTER — Other Ambulatory Visit: Payer: Self-pay

## 2020-07-04 ENCOUNTER — Ambulatory Visit (INDEPENDENT_AMBULATORY_CARE_PROVIDER_SITE_OTHER): Payer: Self-pay | Admitting: Internal Medicine

## 2020-07-04 VITALS — BP 140/100 | HR 121 | Temp 98.5°F | Ht 70.0 in | Wt 165.0 lb

## 2020-07-04 DIAGNOSIS — Z720 Tobacco use: Secondary | ICD-10-CM

## 2020-07-04 DIAGNOSIS — F1020 Alcohol dependence, uncomplicated: Secondary | ICD-10-CM | POA: Diagnosis present

## 2020-07-04 DIAGNOSIS — Z20822 Contact with and (suspected) exposure to covid-19: Secondary | ICD-10-CM | POA: Diagnosis present

## 2020-07-04 DIAGNOSIS — J4540 Moderate persistent asthma, uncomplicated: Secondary | ICD-10-CM

## 2020-07-04 DIAGNOSIS — K7010 Alcoholic hepatitis without ascites: Secondary | ICD-10-CM | POA: Diagnosis present

## 2020-07-04 DIAGNOSIS — Z880 Allergy status to penicillin: Secondary | ICD-10-CM

## 2020-07-04 DIAGNOSIS — F1011 Alcohol abuse, in remission: Secondary | ICD-10-CM | POA: Diagnosis present

## 2020-07-04 DIAGNOSIS — I10 Essential (primary) hypertension: Secondary | ICD-10-CM

## 2020-07-04 DIAGNOSIS — Z825 Family history of asthma and other chronic lower respiratory diseases: Secondary | ICD-10-CM

## 2020-07-04 DIAGNOSIS — K922 Gastrointestinal hemorrhage, unspecified: Secondary | ICD-10-CM | POA: Diagnosis present

## 2020-07-04 DIAGNOSIS — K2101 Gastro-esophageal reflux disease with esophagitis, with bleeding: Principal | ICD-10-CM | POA: Diagnosis present

## 2020-07-04 DIAGNOSIS — Z630 Problems in relationship with spouse or partner: Secondary | ICD-10-CM

## 2020-07-04 DIAGNOSIS — N179 Acute kidney failure, unspecified: Secondary | ICD-10-CM | POA: Diagnosis present

## 2020-07-04 DIAGNOSIS — R739 Hyperglycemia, unspecified: Secondary | ICD-10-CM

## 2020-07-04 DIAGNOSIS — R1084 Generalized abdominal pain: Secondary | ICD-10-CM

## 2020-07-04 DIAGNOSIS — R7989 Other specified abnormal findings of blood chemistry: Secondary | ICD-10-CM

## 2020-07-04 DIAGNOSIS — F1490 Cocaine use, unspecified, uncomplicated: Secondary | ICD-10-CM | POA: Diagnosis present

## 2020-07-04 DIAGNOSIS — Z7289 Other problems related to lifestyle: Secondary | ICD-10-CM | POA: Insufficient documentation

## 2020-07-04 DIAGNOSIS — R7401 Elevation of levels of liver transaminase levels: Secondary | ICD-10-CM

## 2020-07-04 DIAGNOSIS — N183 Chronic kidney disease, stage 3 unspecified: Secondary | ICD-10-CM | POA: Diagnosis present

## 2020-07-04 DIAGNOSIS — K921 Melena: Secondary | ICD-10-CM

## 2020-07-04 DIAGNOSIS — Z789 Other specified health status: Secondary | ICD-10-CM

## 2020-07-04 DIAGNOSIS — F1010 Alcohol abuse, uncomplicated: Secondary | ICD-10-CM | POA: Diagnosis present

## 2020-07-04 DIAGNOSIS — I129 Hypertensive chronic kidney disease with stage 1 through stage 4 chronic kidney disease, or unspecified chronic kidney disease: Secondary | ICD-10-CM | POA: Diagnosis present

## 2020-07-04 HISTORY — DX: Gastrointestinal hemorrhage, unspecified: K92.2

## 2020-07-04 LAB — URINALYSIS, ROUTINE W REFLEX MICROSCOPIC
Bilirubin Urine: NEGATIVE
Glucose, UA: NEGATIVE mg/dL
Ketones, ur: 80 mg/dL — AB
Leukocytes,Ua: NEGATIVE
Nitrite: NEGATIVE
Protein, ur: >=300 mg/dL — AB
Specific Gravity, Urine: 1.025 (ref 1.005–1.030)
pH: 5 (ref 5.0–8.0)

## 2020-07-04 LAB — CBC
HCT: 56.3 % — ABNORMAL HIGH (ref 39.0–52.0)
HCT: 50.8 % (ref 39.0–52.0)
Hemoglobin: 19 g/dL — ABNORMAL HIGH (ref 13.0–17.0)
Hemoglobin: 17.2 g/dL — ABNORMAL HIGH (ref 13.0–17.0)
MCH: 30.1 pg (ref 26.0–34.0)
MCH: 30.5 pg (ref 26.0–34.0)
MCHC: 33.7 g/dL (ref 30.0–36.0)
MCHC: 33.9 g/dL (ref 30.0–36.0)
MCV: 89.1 fL (ref 80.0–100.0)
MCV: 90.1 fL (ref 80.0–100.0)
Platelets: 186 10*3/uL (ref 150–400)
Platelets: 169 10*3/uL (ref 150–400)
RBC: 6.32 MIL/uL — ABNORMAL HIGH (ref 4.22–5.81)
RBC: 5.64 MIL/uL (ref 4.22–5.81)
RDW: 13.1 % (ref 11.5–15.5)
RDW: 13 % (ref 11.5–15.5)
WBC: 5.6 10*3/uL (ref 4.0–10.5)
WBC: 6 10*3/uL (ref 4.0–10.5)
nRBC: 0 % (ref 0.0–0.2)
nRBC: 0 % (ref 0.0–0.2)

## 2020-07-04 LAB — RESPIRATORY PANEL BY RT PCR (FLU A&B, COVID)
Influenza A by PCR: NEGATIVE
Influenza B by PCR: NEGATIVE
SARS Coronavirus 2 by RT PCR: NEGATIVE

## 2020-07-04 LAB — IRON: Iron: 247 ug/dL — ABNORMAL HIGH (ref 45–182)

## 2020-07-04 LAB — TYPE AND SCREEN
ABO/RH(D): O POS
Antibody Screen: NEGATIVE

## 2020-07-04 LAB — HEPATITIS PANEL, ACUTE
HCV Ab: NONREACTIVE
Hep A IgM: NONREACTIVE
Hep B C IgM: NONREACTIVE
Hepatitis B Surface Ag: NONREACTIVE

## 2020-07-04 LAB — RAPID URINE DRUG SCREEN, HOSP PERFORMED
Amphetamines: NOT DETECTED
Barbiturates: NOT DETECTED
Benzodiazepines: NOT DETECTED
Cocaine: POSITIVE — AB
Opiates: NOT DETECTED
Tetrahydrocannabinol: NOT DETECTED

## 2020-07-04 LAB — FERRITIN: Ferritin: 1320 ng/mL — ABNORMAL HIGH (ref 24–336)

## 2020-07-04 LAB — COMPREHENSIVE METABOLIC PANEL
ALT: 119 U/L — ABNORMAL HIGH (ref 0–44)
AST: 177 U/L — ABNORMAL HIGH (ref 15–41)
Albumin: 5 g/dL (ref 3.5–5.0)
Alkaline Phosphatase: 66 U/L (ref 38–126)
Anion gap: 23 — ABNORMAL HIGH (ref 5–15)
BUN: 14 mg/dL (ref 6–20)
CO2: 19 mmol/L — ABNORMAL LOW (ref 22–32)
Calcium: 9.9 mg/dL (ref 8.9–10.3)
Chloride: 91 mmol/L — ABNORMAL LOW (ref 98–111)
Creatinine, Ser: 1.35 mg/dL — ABNORMAL HIGH (ref 0.61–1.24)
GFR, Estimated: >60 mL/min (ref >60)
Glucose, Bld: 110 mg/dL — ABNORMAL HIGH (ref 70–99)
Potassium: 4 mmol/L (ref 3.5–5.1)
Sodium: 133 mmol/L — ABNORMAL LOW (ref 135–145)
Total Bilirubin: 2.4 mg/dL — ABNORMAL HIGH (ref 0.3–1.2)
Total Protein: 8.5 g/dL — ABNORMAL HIGH (ref 6.5–8.1)

## 2020-07-04 LAB — HIV ANTIBODY (ROUTINE TESTING W REFLEX): HIV Screen 4th Generation wRfx: NONREACTIVE

## 2020-07-04 LAB — PROTIME-INR
INR: 1 (ref 0.8–1.2)
Prothrombin Time: 12.4 seconds (ref 11.4–15.2)

## 2020-07-04 LAB — LIPASE, BLOOD: Lipase: 57 U/L — ABNORMAL HIGH (ref 11–51)

## 2020-07-04 LAB — ACETAMINOPHEN LEVEL: Acetaminophen (Tylenol), Serum: <10 ug/mL — ABNORMAL LOW (ref 10–30)

## 2020-07-04 LAB — ETHANOL: Alcohol, Ethyl (B): <10 mg/dL (ref <10)

## 2020-07-04 LAB — ABO/RH: ABO/RH(D): O POS

## 2020-07-04 LAB — POC OCCULT BLOOD, ED: Fecal Occult Bld: POSITIVE — AB

## 2020-07-04 MED ORDER — NICOTINE 7 MG/24HR TD PT24
7.0000 mg | MEDICATED_PATCH | Freq: Every day | TRANSDERMAL | Status: DC | PRN
  Administered 2020-07-04 – 2020-07-05 (×2): 7 mg via TRANSDERMAL
  Filled 2020-07-04 (×2): qty 1

## 2020-07-04 MED ORDER — PANTOPRAZOLE SODIUM 40 MG IV SOLR: 40.0000 mg | Freq: Two times a day (BID) | INTRAVENOUS | Status: DC

## 2020-07-04 MED ORDER — ONDANSETRON HCL 4 MG PO TABS: 4.0000 mg | ORAL_TABLET | Freq: Four times a day (QID) | ORAL | Status: DC | PRN

## 2020-07-04 MED ORDER — NICOTINE 7 MG/24HR TD PT24: 7.0000 mg | MEDICATED_PATCH | Freq: Every day | TRANSDERMAL | Status: DC

## 2020-07-04 MED ORDER — ACETAMINOPHEN 650 MG RE SUPP: 650.0000 mg | Freq: Four times a day (QID) | RECTAL | Status: DC | PRN

## 2020-07-04 MED ORDER — LACTATED RINGERS IV SOLN: INTRAVENOUS | Status: DC

## 2020-07-04 MED ORDER — AMLODIPINE BESYLATE 5 MG PO TABS
5.0000 mg | ORAL_TABLET | Freq: Every day | ORAL | Status: DC
  Administered 2020-07-05: 16:00:00 5 mg via ORAL
  Filled 2020-07-04: qty 1

## 2020-07-04 MED ORDER — LORAZEPAM 2 MG/ML IJ SOLN: 1.0000 mg | INTRAMUSCULAR | Status: DC | PRN

## 2020-07-04 MED ORDER — HYDRALAZINE HCL 20 MG/ML IJ SOLN: 5.0000 mg | INTRAMUSCULAR | Status: DC | PRN

## 2020-07-04 MED ORDER — SODIUM CHLORIDE 0.9 % IV BOLUS
1000.0000 mL | Freq: Once | INTRAVENOUS | Status: AC
  Administered 2020-07-04: 1000 mL via INTRAVENOUS

## 2020-07-04 MED ORDER — TIOTROPIUM BROMIDE MONOHYDRATE 2.5 MCG/ACT IN AERS: 2.0000 | INHALATION_SPRAY | Freq: Every day | RESPIRATORY_TRACT | Status: DC

## 2020-07-04 MED ORDER — MORPHINE SULFATE (PF) 4 MG/ML IV SOLN
4.0000 mg | Freq: Once | INTRAVENOUS | Status: AC
  Administered 2020-07-04: 4 mg via INTRAVENOUS
  Filled 2020-07-04: qty 1

## 2020-07-04 MED ORDER — ONDANSETRON HCL 4 MG/2ML IJ SOLN
4.0000 mg | Freq: Four times a day (QID) | INTRAMUSCULAR | Status: DC | PRN
  Administered 2020-07-05: 09:00:00 4 mg via INTRAVENOUS
  Filled 2020-07-04: qty 2

## 2020-07-04 MED ORDER — ALBUTEROL SULFATE HFA 108 (90 BASE) MCG/ACT IN AERS: 1.0000 | INHALATION_SPRAY | Freq: Four times a day (QID) | RESPIRATORY_TRACT | Status: DC | PRN

## 2020-07-04 MED ORDER — IOHEXOL 9 MG/ML PO SOLN
ORAL | Status: AC
  Filled 2020-07-04: qty 1000

## 2020-07-04 MED ORDER — THIAMINE HCL 100 MG/ML IJ SOLN: 100.0000 mg | Freq: Every day | INTRAMUSCULAR | Status: DC

## 2020-07-04 MED ORDER — ACETAMINOPHEN 325 MG PO TABS: 650.0000 mg | ORAL_TABLET | Freq: Four times a day (QID) | ORAL | Status: DC | PRN

## 2020-07-04 MED ORDER — ONDANSETRON HCL 4 MG/2ML IJ SOLN
4.0000 mg | Freq: Once | INTRAMUSCULAR | Status: AC
  Administered 2020-07-04: 4 mg via INTRAVENOUS
  Filled 2020-07-04: qty 2

## 2020-07-04 MED ORDER — LORAZEPAM 1 MG PO TABS: 1.0000 mg | ORAL_TABLET | ORAL | Status: DC | PRN

## 2020-07-04 MED ORDER — MORPHINE SULFATE (PF) 2 MG/ML IV SOLN
2.0000 mg | INTRAVENOUS | Status: DC | PRN
  Administered 2020-07-04 – 2020-07-05 (×3): 2 mg via INTRAVENOUS
  Filled 2020-07-04 (×4): qty 1

## 2020-07-04 MED ORDER — UMECLIDINIUM BROMIDE 62.5 MCG/INH IN AEPB
1.0000 | INHALATION_SPRAY | Freq: Every day | RESPIRATORY_TRACT | Status: DC
  Filled 2020-07-04: qty 7

## 2020-07-04 MED ORDER — SODIUM CHLORIDE 0.9 % IV SOLN
8.0000 mg/h | INTRAVENOUS | Status: DC
  Administered 2020-07-04 (×2): 8 mg/h via INTRAVENOUS
  Filled 2020-07-04 (×4): qty 80

## 2020-07-04 MED ORDER — ADULT MULTIVITAMIN W/MINERALS CH
1.0000 | ORAL_TABLET | Freq: Every day | ORAL | Status: DC
  Administered 2020-07-04 – 2020-07-05 (×2): 1 via ORAL
  Filled 2020-07-04 (×2): qty 1

## 2020-07-04 MED ORDER — IOHEXOL 12 MG/ML PO SOLN: 500.0000 mL | ORAL | Status: AC

## 2020-07-04 MED ORDER — PANTOPRAZOLE SODIUM 40 MG IV SOLR
40.0000 mg | Freq: Once | INTRAVENOUS | Status: AC
  Administered 2020-07-04: 40 mg via INTRAVENOUS
  Filled 2020-07-04: qty 40

## 2020-07-04 MED ORDER — THIAMINE HCL 100 MG PO TABS
100.0000 mg | ORAL_TABLET | Freq: Every day | ORAL | Status: DC
  Administered 2020-07-04 – 2020-07-05 (×2): 100 mg via ORAL
  Filled 2020-07-04 (×2): qty 1

## 2020-07-04 MED ORDER — FOLIC ACID 1 MG PO TABS
1.0000 mg | ORAL_TABLET | Freq: Every day | ORAL | Status: DC
  Administered 2020-07-04 – 2020-07-05 (×2): 1 mg via ORAL
  Filled 2020-07-04 (×2): qty 1
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
## 2020-07-04 NOTE — H&P (Signed)
History and Physical    Jay Gentry FUX:[id number] DOB: 11-05-1975 DOA: 07/04/2020  PCP: Corwin Levins, MD Consultants:  None Patient coming from:  Home - lives with wife and children; NOK: Wife, Jay Gentry, [phone]   Chief Complaint: Jay Gentry stools  HPI: Jay Gentry is a 44 y.o. male with medical history significant of perirectal abscess; and HTN presenting with tarry stools.  He reports that he has been undergoing stress.   He has been drinking ETOH and not eating or getting enough sleep.  Black stools, N/V with a few episodes prior to admitting.  Black stools without BRBPR.  Mid-umbilical pain.  He took Melatonin last night to try to get some sleep, felt hungover today.  For the last 2 days, he has maybe 6 mini-bottles of liquor, but usually drinks 3-4 most days.  He hasn't been eating because he has been feeling nauseated/stressed.  Weight down maybe 10 pounds in a week.  No prior h/o GI bleeding.  He takes Customer service manager ASA for pain and probably took it 4 days in the last week.      ED Course: h/o ETOH abuse with binge drinking for several days.  Abdominal pain, weakness, black stools.  Heme positive, elevated LFTs.  GI consulted and will see.  CT A/P ordered.  Review of Systems: As per HPI; otherwise review of systems reviewed and negative.   Ambulatory Status:  Ambulates without assistance  COVID Vaccine Status:  None  Past Medical History:  Diagnosis Date  . Acute upper GI bleeding 07/04/2020  . Asthma   . Bronchitis   . Hypertension   . Perirectal abscess 05/14/2015    Past Surgical History:  Procedure Laterality Date  . INCISION AND DRAINAGE PERIRECTAL ABSCESS  05/14/2015  . INCISION AND DRAINAGE PERIRECTAL ABSCESS N/A 05/14/2015   Procedure: DRAINAGE PERIRECTAL ABSCESS;  Surgeon: Manus Rudd, MD;  Location: [facility] OR;  Service: General;  Laterality: N/A;  . INCISION AND DRAINAGE PERIRECTAL ABSCESS N/A 11/09/2016   Procedure: IRRIGATION AND DEBRIDEMENT  PERIRECTAL ABSCESS;  Surgeon: Glenna Fellows, MD;  Location: [facility];  Service: General;  Laterality: N/A;    Social History   Socioeconomic History  . Marital status: Married    Spouse name: Not on file  . Number of children: Not on file  . Years of education: Not on file  . Highest education level: Not on file  Occupational History  . Occupation: Museum/gallery exhibitions officer  Tobacco Use  . Smoking status: Current Every Day Smoker    Packs/day: 0.50    Years: 27.00    Pack years: 13.50    Types: Cigarettes  . Smokeless tobacco: Never Used  . Tobacco comment: declines patch  Vaping Use  . Vaping Use: Never used  Substance and Sexual Activity  . Alcohol use: Yes    Comment: occassional  . Drug use: Not Currently  . Sexual activity: Not on file  Other Topics Concern  . Not on file  Social History Narrative   ** Merged History Encounter **       Social Determinants of Health   Financial Resource Strain:   . Difficulty of Paying Living Expenses: Not on file  Food Insecurity:   . Worried About Programme researcher, broadcasting/film/video in the Last Year: Not on file  . Ran Out of Food in the Last Year: Not on file  Transportation Needs:   . Lack of Transportation (Medical): Not on file  . Lack of Transportation (Non-Medical): Not on file  Physical  Activity:   . Days of Exercise per Week: Not on file  . Minutes of Exercise per Session: Not on file  Stress:   . Feeling of Stress : Not on file  Social Connections:   . Frequency of Communication with Friends and Family: Not on file  . Frequency of Social Gatherings with Friends and Family: Not on file  . Attends Religious Services: Not on file  . Active Member of Clubs or Organizations: Not on file  . Attends Banker Meetings: Not on file  . Marital Status: Not on file  Intimate Partner Violence:   . Fear of Current or Ex-Partner: Not on file  . Emotionally Abused: Not on file  . Physically Abused: Not on file  . Sexually Abused: Not on  file    Allergies  Allergen Reactions  . Penicillins Hives    Has patient had a PCN reaction causing immediate rash, facial/tongue/throat swelling, SOB or lightheadedness with hypotension: yes Has patient had a PCN reaction causing severe rash involving mucus membranes or skin necrosis: no Has patient had a PCN reaction that required hospitalization no Has patient had a PCN reaction occurring within the last 10 years: no If all of the above answers are "NO", then may proceed with Cephalosporin use.   Marland Kitchen Penicillins Hives    Did it involve swelling of the face/tongue/throat, SOB, or low BP? No Did it involve sudden or severe rash/hives, skin peeling, or any reaction on the inside of your mouth or nose?  Did you need to seek medical attention at a hospital or doctor's office? Unknown When did it last happen?childhood If all above answers are "NO", may proceed with cephalosporin use.    Family History  Problem Relation Age of Onset  . Asthma Mother     Prior to Admission medications   Medication Sig Start Date End Date Taking? Authorizing Provider  acetaminophen (TYLENOL) 325 MG tablet You can take plain Tylenol, 2 tablets every 6 hours as needed for pain. 11/09/16   Sherrie George, PA-C  acetaminophen (TYLENOL) 500 MG tablet Take 2 tablets (1,000 mg total) by mouth every 8 (eight) hours as needed for mild pain or headache. 11/14/18   Juliet Rude, PA-C  albuterol (PROVENTIL) (2.5 MG/3ML) 0.083% nebulizer solution Take 3 mLs (2.5 mg total) by nebulization every 4 (four) hours as needed for wheezing or shortness of breath. 02/26/20   Corwin Levins, MD  albuterol (VENTOLIN HFA) 108 (90 Base) MCG/ACT inhaler INHALE 1 TO 2 PUFFS BY MOUTH EVERY 6 HOURS AS NEEDED FOR WHEEZING OR SHORTNESS OF BREATH 06/18/20   Olive Bass, FNP  amLODipine (NORVASC) 5 MG tablet TAKE 1 TABLET(5 MG) BY MOUTH DAILY 04/08/20   Corwin Levins, MD  BREO ELLIPTA 100-25 MCG/INH AEPB Inhale 1 puff into  the lungs daily. 02/12/19   Corwin Levins, MD  diphenhydrAMINE (BENADRYL) 25 mg capsule Take 25 mg by mouth every 6 (six) hours as needed for allergies.    [provider]  fluticasone (FLONASE) 50 MCG/ACT nasal spray Place 2 sprays into both nostrils daily. 02/12/19   Corwin Levins, MD  Tiotropium Bromide Monohydrate (SPIRIVA RESPIMAT) 2.5 MCG/ACT AERS Inhale 2 puffs into the lungs daily. 02/12/19   Corwin Levins, MD  varenicline (CHANTIX CONTINUING MONTH PAK) 1 MG tablet Take 1 tablet (1 mg total) by mouth 2 (two) times daily. 02/12/19   Corwin Levins, MD  varenicline (CHANTIX STARTING MONTH PAK) 0.5 MG X 11 &  1 MG X 42 tablet Take one 0.5 mg tablet by mouth once daily for 3 days, then increase to one 0.5 mg tablet twice daily for 4 days, then increase to one 1 mg tablet twice daily. 02/12/19   Corwin Levins, MD    Physical Exam: Vitals:   07/04/20 1014 07/04/20 1221 07/04/20 1320  BP: (!) 146/97  132/88  Pulse: (!) 118 96 96  Resp: 16  13  Temp: 98.3 F (36.8 C)    TempSrc: Oral    SpO2: 100% 98% 98%     . General:  Appears calm and comfortable and is NAD . Eyes:  PERRL, EOMI, normal lids, iris . ENT:  grossly normal hearing, lips & tongue, mmm . Neck:  no LAD, masses or thyromegaly . Cardiovascular:  RRR, no m/r/g. No LE edema.  Marland Kitchen Respiratory:   CTA bilaterally with no wheezes/rales/rhonchi.  Normal respiratory effort. . Abdomen:  soft, mild midepigastric TTP, ND, NABS . Skin:  no rash or induration seen on limited exam . Musculoskeletal:  grossly normal tone BUE/BLE, good ROM, no bony abnormality . Psychiatric:  grossly normal mood and affect, speech fluent and appropriate, AOx3 . Neurologic:  CN 2-12 grossly intact, moves all extremities in coordinated fashion    Radiological Exams on Admission: No results found.  EKG: not done   Labs on Admission: I have personally reviewed the available labs and imaging studies at the time of the admission.  Pertinent labs:   Na++  133 CO2 19 Glucose 110 BUN 14/Creatinine 1.35/GFR >60 - stable Lipase 57 AST 177/ALT 119/Bili 2.4 WBC 5.6 Hgb 19.0 COVID/flu negative UA: moderate Hgb, 80 ketones, >300 protein, rare bacteria Heme positive   Assessment/Plan Principal Problem:   Acute upper GI bleeding Active Problems:   Essential hypertension   Moderate persistent asthma without complication   Tobacco abuse   Alcohol abuse   Acute Upper GI Bleed -Patient with periumbilical abdominal pain, n/v, and tarry stools (heme positive) after binge drinking  -Most likely diagnosis is gastritis, esophagitis, gastric or duodenal ulcer -His Hgb is hemoconcentrated, with significant ketonuria; will trend q12h -The patient is no longer tachycardic with normal blood pressure, suggesting subacute volume loss.  -Type and screen is ordered  -will admit to med surg -GI consulted by ED, will follow up recommendations -NPO for possible EGD -LR at 100 mL/hr -Start IV pantoprazole bolus and infusion -Zofran IV for nausea -Avoid NSAIDs and SQ heparin -Maintain IV access (2 large bore IVs if possible). -Monitor closely and follow cbc, transfuse as necessary.  Alcohol abuse -Patient with apparent ETOH dependence, worse by recent life stress -Number of drinks per day: 3-6 -Number of admissions for management of alcohol withdrawal syndrome (detox): 0 -History of withdrawal seizures, ICU admissions, DTs: 0 -Patient is not exhibiting active s/sx of withdrawal -He reports last drink was Tuesday night -He is at moderately increased risk for complications of withdrawal including seizures, DTs -CIWA protocol -Folate, thiamine, and MVI ordered -Will provide symptom-triggered BZD (ativan per CIWA protocol) only since the patient is able to communicate; is not showing current signs of delirium; and has no history of severe withdrawal. -TOC team consult for substance abuse education -Will also check UDS. -Elevated LFTs are likely related  to alcoholism -Consider offering a medication for Alcohol Use Disorder at the time of d/c, to include Disulfuram; Naltrexone; or Acamprosate.  HTN -Continue Norvasc  Asthma -Continue Spiriva and prn Albuterol HFA  Tobacco abuse -Encourage cessation.   -This was  discussed with the patient and should be reviewed on an ongoing basis.   -Patch ordered for prn use   Note: This patient has been tested and is negative for the novel coronavirus COVID-19. The patient has not been vaccinated against COVID-19.    DVT prophylaxis:  SCDs Code Status:  Full - confirmed with patient Family Communication: Wife was present for the conclusion of the visit Disposition Plan:  The patient is from: home  [facility]tic[facility]   Anticipated d/c date will depend on clinical response to treatment, but likely 1-2 days  Patient is currently: acutely ill Consults called: GI Admission status:  Admit - It is my clinical opinion that admission to INPATIENT is reasonable and necessary because of the expectation that this patient will require hospital care that crosses at least 2 midnights to treat this condition based on the medical complexity of the problems presented.  Given the aforementioned information, the predictability of an adverse outcome is felt to be significant.    Jonah BlueJennifer Livan Hires MD [facility]   How to contact the [facility] Attending or Consulting provider 7A - 7P or covering provider during after hours 7P -7A, for this patient?  1. Check the care team in [facility] and look for a) attending/consulting [facility] provider listed and b) the [facility] team listed 2. Log into [email or web address] and use [place]'s universal password to access. If you do not have the password, please contact the hospital operator. 3. Locate the [facility] provider you are looking for under [facility] and page to a number that you can be directly reached. 4. If you still have difficulty reaching the provider,  please page the [facility] (Director on Call) for the Hospitalists listed on amion for assistance.   07/04/2020, 2:48 PM

## 2020-07-04 NOTE — ED Triage Notes (Addendum)
Patient c/o mid abdominal pain and nausea x 2 days. Patient states he has noticed when he drinks he has the pain, but when he does not drink then the pain and nausea subside.

## 2020-07-04 NOTE — Assessment & Plan Note (Addendum)
High suspicion for UGI bleeding, symptomatic - for refer ED now  I spent 41 minutes in preparing to see the patient by review of recent labs, imaging and procedures, obtaining and reviewing separately obtained history, communicating with the patient and family or caregiver, ordering medications, tests or procedures, and documenting clinical information in the EHR including the differential Dx, treatment, and any further evaluation and other management of black stools, etoh use, marital discord, htn, hyperglycemia asthma

## 2020-07-04 NOTE — Assessment & Plan Note (Signed)
stable overall by history and exam, recent data reviewed with pt, and pt to continue medical treatment as before,  to f/u any worsening symptoms or concerns  

## 2020-07-04 NOTE — Assessment & Plan Note (Signed)
Mod elevated likely reactive, also tachycardic, cont to follow

## 2020-07-04 NOTE — Assessment & Plan Note (Signed)
stable overall by history and exam, recent data reviewed with pt, and pt to continue medical treatment as before,  to f/u any worsening symptoms or concerns, but ? Smells mild ketotic

## 2020-07-04 NOTE — Patient Instructions (Signed)
Please go to ED now for probably bleeding from the stomach  Please continue all other medications as before, and refills have been done if requested.  Please have the pharmacy call with any other refills you may need.  Please keep your appointments with your specialists as you may have planned

## 2020-07-04 NOTE — Assessment & Plan Note (Signed)
Encouraged marital counseling

## 2020-07-04 NOTE — Assessment & Plan Note (Signed)
Need to watch for s/s withdrawal
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
## 2020-07-04 NOTE — Assessment & Plan Note (Signed)
Encouraged cont trying to quit

## 2020-07-05 ENCOUNTER — Inpatient Hospital Stay (HOSPITAL_COMMUNITY): Payer: Self-pay | Admitting: Anesthesiology

## 2020-07-05 ENCOUNTER — Encounter (HOSPITAL_COMMUNITY)
Admission: EM | Disposition: A | Discharge status: Home / Self Care | Payer: Self-pay | Source: Home / Self Care | Attending: Internal Medicine

## 2020-07-05 ENCOUNTER — Encounter (HOSPITAL_COMMUNITY): Payer: Self-pay | Admitting: Internal Medicine

## 2020-07-05 DIAGNOSIS — N179 Acute kidney failure, unspecified: Secondary | ICD-10-CM

## 2020-07-05 DIAGNOSIS — K920 Hematemesis: Secondary | ICD-10-CM

## 2020-07-05 DIAGNOSIS — I10 Essential (primary) hypertension: Secondary | ICD-10-CM

## 2020-07-05 DIAGNOSIS — K2100 Gastro-esophageal reflux disease with esophagitis, without bleeding: Secondary | ICD-10-CM

## 2020-07-05 HISTORY — PX: ESOPHAGOGASTRODUODENOSCOPY: SHX5428

## 2020-07-05 LAB — CBC
HCT: 45.1 % (ref 39.0–52.0)
Hemoglobin: 15 g/dL (ref 13.0–17.0)
MCH: 30.4 pg (ref 26.0–34.0)
MCHC: 33.3 g/dL (ref 30.0–36.0)
MCV: 91.3 fL (ref 80.0–100.0)
Platelets: 133 10*3/uL — ABNORMAL LOW (ref 150–400)
RBC: 4.94 MIL/uL (ref 4.22–5.81)
RDW: 12.7 % (ref 11.5–15.5)
WBC: 4.5 10*3/uL (ref 4.0–10.5)
nRBC: 0 % (ref 0.0–0.2)

## 2020-07-05 LAB — COMPREHENSIVE METABOLIC PANEL
ALT: 74 U/L — ABNORMAL HIGH (ref 0–44)
AST: 83 U/L — ABNORMAL HIGH (ref 15–41)
Albumin: 3.8 g/dL (ref 3.5–5.0)
Alkaline Phosphatase: 42 U/L (ref 38–126)
Anion gap: 8 (ref 5–15)
BUN: 9 mg/dL (ref 6–20)
CO2: 26 mmol/L (ref 22–32)
Calcium: 8.8 mg/dL — ABNORMAL LOW (ref 8.9–10.3)
Chloride: 100 mmol/L (ref 98–111)
Creatinine, Ser: 1.13 mg/dL (ref 0.61–1.24)
GFR, Estimated: >60 mL/min (ref >60)
Glucose, Bld: 99 mg/dL (ref 70–99)
Potassium: 4.1 mmol/L (ref 3.5–5.1)
Sodium: 134 mmol/L — ABNORMAL LOW (ref 135–145)
Total Bilirubin: 1.9 mg/dL — ABNORMAL HIGH (ref 0.3–1.2)
Total Protein: 6.1 g/dL — ABNORMAL LOW (ref 6.5–8.1)

## 2020-07-05 LAB — HEPATITIS PANEL, ACUTE
HCV Ab: NONREACTIVE
Hep A IgM: NONREACTIVE
Hep B C IgM: NONREACTIVE
Hepatitis B Surface Ag: NONREACTIVE

## 2020-07-05 LAB — IGG: IgG (Immunoglobin G), Serum: 810 mg/dL (ref 603–1613)

## 2020-07-05 LAB — CERULOPLASMIN: Ceruloplasmin: 19.5 mg/dL (ref 16.0–31.0)

## 2020-07-05 SURGERY — EGD (ESOPHAGOGASTRODUODENOSCOPY)
Anesthesia: Monitor Anesthesia Care

## 2020-07-05 MED ORDER — PROPOFOL 500 MG/50ML IV EMUL
INTRAVENOUS | Status: DC | PRN
  Administered 2020-07-05: 100 ug/kg/min via INTRAVENOUS
  Administered 2020-07-05: 150 mg via INTRAVENOUS

## 2020-07-05 MED ORDER — CYCLOBENZAPRINE HCL 5 MG PO TABS
5.0000 mg | ORAL_TABLET | Freq: Three times a day (TID) | ORAL | Status: DC | PRN
  Administered 2020-07-05: 16:00:00 5 mg via ORAL
  Filled 2020-07-05: qty 1

## 2020-07-05 MED ORDER — SODIUM CHLORIDE 0.9 % IV SOLN: INTRAVENOUS | Status: DC

## 2020-07-05 MED ORDER — NICOTINE 7 MG/24HR TD PT24: 7.0000 mg | MEDICATED_PATCH | Freq: Every day | TRANSDERMAL | 0 refills | Status: DC | PRN

## 2020-07-05 MED ORDER — OMEPRAZOLE 20 MG PO CPDR: 40.0000 mg | DELAYED_RELEASE_CAPSULE | Freq: Two times a day (BID) | ORAL | 0 refills | Status: DC

## 2020-07-05 NOTE — Anesthesia Postprocedure Evaluation (Signed)
Anesthesia Post Note  Patient: Ether Wolters  Procedure(s) Performed: ESOPHAGOGASTRODUODENOSCOPY (EGD) (N/A )     Patient location during evaluation: PACU Anesthesia Type: MAC Level of consciousness: awake and alert Pain management: pain level controlled Vital Signs Assessment: post-procedure vital signs reviewed and stable Respiratory status: spontaneous breathing, nonlabored ventilation and respiratory function stable Cardiovascular status: blood pressure returned to baseline and stable Postop Assessment: no apparent nausea or vomiting Anesthetic complications: no   No complications documented.  Last Vitals:  Vitals:   07/05/20 1248 07/05/20 1350  BP: (!) 141/80 120/78  Pulse: 67 90  Resp: 10 15  Temp:  36.7 C  SpO2: 98% 100%    Last Pain:  Vitals:   07/05/20 1350  TempSrc: Oral  PainSc: 0-No pain                 Pervis Hocking

## 2020-07-05 NOTE — Discharge Summary (Signed)
Physician Discharge Summary  Jay Gentry ZOX:[id number] DOB: [birth year] DOA: 07/04/2020  PCP: Corwin Levins, MD  Admit date: 07/04/2020 Discharge date: 07/05/2020  Admitted From: Home Disposition:  Home  Recommendations for Outpatient Follow-up:  1. Follow up with PCP in 1-2 weeks  Discharge Condition:Stable CODE STATUS:Full Diet recommendation: Regular   Brief/Interim Summary: 44 y.o. male with medical history significant of perirectal abscess; and HTN presenting with tarry stools.  He reports that he has been undergoing stress. He has been drinking ETOH and not eating or getting enough sleep. Black stools, N/V with a few episodes prior to admitting. Black stools without BRBPR. Mid-umbilical pain. He took Melatonin last night to try to get some sleep, felt hungover today. For the last 2 days, he has maybe 6 mini-bottles of liquor, but usually drinks 3-4 most days. He hasn't been eating because he has been feeling nauseated/stressed. Weight down maybe 10 pounds in a week. No prior h/o GI bleeding. He takes Customer service manager ASA for pain and probably took it 4 days in the last week.   Discharge Diagnoses:  Principal Problem:   Acute upper GI bleeding Active Problems:   Essential hypertension   Moderate persistent asthma without complication   Tobacco abuse   Alcohol abuse  Acute Upper GI Bleed -Patient with periumbilical abdominal pain, n/v, and tarry stools (heme positive) after binge drinking  -GI was consulted and pt underwent EGD 10/23. Findings of esophagitis with no bleeding -GI recs for omeprazole  bid x 4 weeks and to abstain from ETOH  Alcohol abuse -Patient with apparent ETOH dependence, worse by recent life stress -Number of drinks per day: 3-6 -Cessation done. Pt desires to quit etoh in the future  HTN -Continued Norvasc  Asthma -Continue Spiriva and prn Albuterol HFA  Tobacco abuse -Pt is keen on quitting smoking. Pt congratulated -Nicotine  patches prescribed   Discharge Instructions   Allergies as of 07/05/2020      Reactions   Penicillins Hives   Has patient had a PCN reaction causing immediate rash, facial/tongue/throat swelling, SOB or lightheadedness with hypotension: yes Has patient had a PCN reaction causing severe rash involving mucus membranes or skin necrosis: no Has patient had a PCN reaction that required hospitalization no Has patient had a PCN reaction occurring within the last 10 years: no If all of the above answers are "NO", then may proceed with Cephalosporin use.   Penicillins Hives   Did it involve swelling of the face/tongue/throat, SOB, or low BP? No Did it involve sudden or severe rash/hives, skin peeling, or any reaction on the inside of your mouth or nose?  Did you need to seek medical attention at a hospital or doctor's office? Unknown When did it last happen?childhood If all above answers are "NO", may proceed with cephalosporin use.      Medication List    TAKE these medications   acetaminophen 500 MG tablet Commonly known as: TYLENOL Take 2 tablets (1,000 mg total) by mouth every 8 (eight) hours as needed for mild pain or headache.   albuterol (2.5 MG/3ML) 0.083% nebulizer solution Commonly known as: PROVENTIL Take 3 mLs (2.5 mg total) by nebulization every 4 (four) hours as needed for wheezing or shortness of breath. What changed: Another medication with the same name was changed. Make sure you understand how and when to take each.   albuterol 108 (90 Base) MCG/ACT inhaler Commonly known as: VENTOLIN HFA INHALE 1 TO 2 PUFFS BY MOUTH EVERY 6 HOURS AS NEEDED FOR  WHEEZING OR SHORTNESS OF BREATH What changed:   how much to take  how to take this  when to take this  reasons to take this  additional instructions   amLODipine 5 MG tablet Commonly known as: NORVASC TAKE 1 TABLET(5 MG) BY MOUTH DAILY What changed: See the new instructions.   aspirin EC 81 MG tablet Take  81-162 mg by mouth daily. Swallow whole.   diphenhydrAMINE 25 mg capsule Commonly known as: BENADRYL Take 25 mg by mouth every 6 (six) hours as needed for allergies.   nicotine 7 mg/24hr patch Commonly known as: NICODERM CQ - dosed in mg/24 hr Place 1 patch (7 mg total) onto the skin daily as needed (tobacco dependence).   omeprazole 20 MG capsule Commonly known as: PriLOSEC Take 2 capsules (40 mg total) by mouth 2 (two) times daily before a meal for 28 days.   Tiotropium Bromide Monohydrate 2.5 MCG/ACT Aers Commonly known as: Spiriva Respimat Inhale 2 puffs into the lungs daily.       Follow-up Information    Corwin Levins, MD. Schedule an appointment as soon as possible for a visit in 2 week(s).   Specialties: Internal Medicine, Radiology Contact information: [address] [place] [address] [phone]              Allergies  Allergen Reactions  . Penicillins Hives    Has patient had a PCN reaction causing immediate rash, facial/tongue/throat swelling, SOB or lightheadedness with hypotension: yes Has patient had a PCN reaction causing severe rash involving mucus membranes or skin necrosis: no Has patient had a PCN reaction that required hospitalization no Has patient had a PCN reaction occurring within the last 10 years: no If all of the above answers are "NO", then may proceed with Cephalosporin use.   Gentry Kitchen Penicillins Hives    Did it involve swelling of the face/tongue/throat, SOB, or low BP? No Did it involve sudden or severe rash/hives, skin peeling, or any reaction on the inside of your mouth or nose?  Did you need to seek medical attention at a hospital or doctor's office? Unknown When did it last happen?childhood If all above answers are "NO", may proceed with cephalosporin use.    Consultations:  GI  Procedures/Studies: CT ABDOMEN PELVIS WO CONTRAST  Result Date: 07/04/2020 CLINICAL DATA:  Acute abdominal pain with elevated LFTs EXAM:  CT ABDOMEN AND PELVIS WITHOUT CONTRAST TECHNIQUE: Multidetector CT imaging of the abdomen and pelvis was performed following the standard protocol without IV contrast. COMPARISON:  05/14/2015 FINDINGS: Lower chest: No acute abnormality. Hepatobiliary: Fatty infiltration of the liver is noted. Gallbladder is within normal limits. Pancreas: Unremarkable. No pancreatic ductal dilatation or surrounding inflammatory changes. Spleen: Normal in size without focal abnormality. Adrenals/Urinary Tract: Adrenal glands are unremarkable. Kidneys demonstrate no renal calculi or obstructive changes. The ureters are within normal limits bilaterally. Bladder is decompressed. Stomach/Bowel: Previously seen perirectal abscess has resolved in the interval. Colon is well visualized without obstructive or inflammatory change. The appendix is unremarkable. Small bowel and stomach are within normal limits. Vascular/Lymphatic: No significant vascular findings are present. No enlarged abdominal or pelvic lymph nodes. Reproductive: Prostate is unremarkable. Other: No abdominal wall hernia or abnormality. No abdominopelvic ascites. Musculoskeletal: No acute or significant osseous findings. IMPRESSION: Fatty liver No other focal abnormality is noted. Electronically Signed   By: Alcide Clever M.D.   On: 07/04/2020 18:09    Subjective: Feeling hungry this AM. Post-procedure, complained of back pain related to laying  in bed  Discharge Exam: Vitals:   07/05/20 1430 07/05/20 1443  BP: (!) 131/92 (!) 120/95  Pulse: 66 78  Resp: 13 17  Temp:  98.9 F (37.2 C)  SpO2: 100% 97%   Vitals:   07/05/20 1410 07/05/20 1420 07/05/20 1430 07/05/20 1443  BP: (!) 146/94 132/86 (!) 131/92 (!) 120/95  Pulse: 74 66 66 78  Resp: 13 13 13 17   Temp:    98.9 F (37.2 C)  TempSrc:    Oral  SpO2: 100% 100% 100% 97%  Weight:      Height:        General: Pt is alert, awake, not in acute distress Cardiovascular: RRR, S1/S2 +, no rubs, no  gallops Respiratory: CTA bilaterally, no wheezing, no rhonchi Abdominal: Soft, NT, ND, bowel sounds + Extremities: no edema, no cyanosis   The results of significant diagnostics from this hospitalization (including imaging, microbiology, ancillary and laboratory) are listed below for reference.     Microbiology: Recent Results (from the past 240 hour(s))  Respiratory Panel by RT PCR (Flu A&B, Covid) - Nasopharyngeal Swab     Status: None   Collection Time: 07/04/20 12:15 PM   Specimen: Nasopharyngeal Swab  Result Value Ref Range Status   SARS Coronavirus 2 by RT PCR NEGATIVE NEGATIVE Final    Comment: (NOTE) SARS-CoV-2 target nucleic acids are NOT DETECTED.  The SARS-CoV-2 RNA is generally detectable in upper respiratoy specimens during the acute phase of infection. The lowest concentration of SARS-CoV-2 viral copies this assay can detect is 131 copies/mL. A negative result does not preclude SARS-Cov-2 infection and should not be used as the sole basis for treatment or other patient management decisions. A negative result may occur with  improper specimen collection/handling, submission of specimen other than nasopharyngeal swab, presence of viral mutation(s) within the areas targeted by this assay, and inadequate number of viral copies (<131 copies/mL). A negative result must be combined with clinical observations, patient history, and epidemiological information. The expected result is Negative.  Fact Sheet for Patients:  07/06/20  Fact Sheet for Healthcare Providers:  https://www.moore.com/  This test is no t yet approved or cleared by the https://www.young.biz/ FDA and  has been authorized for detection and/or diagnosis of SARS-CoV-2 by FDA under an Emergency Use Authorization (EUA). This EUA will remain  in effect (meaning this test can be used) for the duration of the COVID-19 declaration under Section 564(b)(1) of the Act, 21  U.S.C. section 360bbb-3(b)(1), unless the authorization is terminated or revoked sooner.     Influenza A by PCR NEGATIVE NEGATIVE Final   Influenza B by PCR NEGATIVE NEGATIVE Final    Comment: (NOTE) The Xpert Xpress SARS-CoV-2/FLU/RSV assay is intended as an aid in  the diagnosis of influenza from Nasopharyngeal swab specimens and  should not be used as a sole basis for treatment. Nasal washings and  aspirates are unacceptable for Xpert Xpress SARS-CoV-2/FLU/RSV  testing.  Fact Sheet for Patients: [place]  Fact Sheet for Healthcare Providers: https://www.moore.com/  This test is not yet approved or cleared by the https://www.young.biz/ FDA and  has been authorized for detection and/or diagnosis of SARS-CoV-2 by  FDA under an Emergency Use Authorization (EUA). This EUA will remain  in effect (meaning this test can be used) for the duration of the  Covid-19 declaration under Section 564(b)(1) of the Act, 21  U.S.C. section 360bbb-3(b)(1), unless the authorization is  terminated or revoked. Performed at [facility], [address] [place], [place] [address]      Labs: BNP (last 3 results) No results for input(s): BNP in the last 8760 hours. Basic Metabolic Panel: Recent Labs  Lab 07/04/20 1047 07/05/20 0526  NA 133* 134*  K 4.0 4.1  CL 91* 100  CO2 19* 26  GLUCOSE 110* 99  BUN 14 9  CREATININE 1.35* 1.13  CALCIUM 9.9 8.8*   Liver Function Tests: Recent Labs  Lab 07/04/20 1047 07/05/20 0526  AST 177* 83*  ALT 119* 74*  ALKPHOS 66 42  BILITOT 2.4* 1.9*  PROT 8.5* 6.1*  ALBUMIN 5.0 3.8   Recent Labs  Lab 07/04/20 1047  LIPASE 57*   No results for input(s): AMMONIA in the last 168 hours. CBC: Recent Labs  Lab 07/04/20 1047 07/04/20 1624 07/05/20 0526  WBC 5.6 6.0 4.5  HGB 19.0* 17.2* 15.0  HCT 56.3* 50.8 45.1  MCV 89.1 90.1 91.3  PLT 186 169 133*   Cardiac Enzymes: No results for  input(s): CKTOTAL, CKMB, CKMBINDEX, TROPONINI in the last 168 hours. BNP: Invalid input(s): POCBNP CBG: No results for input(s): GLUCAP in the last 168 hours. D-Dimer No results for input(s): DDIMER in the last 72 hours. Hgb A1c No results for input(s): HGBA1C in the last 72 hours. Lipid Profile No results for input(s): CHOL, HDL, LDLCALC, TRIG, CHOLHDL, LDLDIRECT in the last 72 hours. Thyroid function studies No results for input(s): TSH, T4TOTAL, T3FREE, THYROIDAB in the last 72 hours.  Invalid input(s): FREET3 Anemia work up Recent Labs    07/04/20 1624  FERRITIN 1,320*  IRON 247*   Urinalysis    Component Value Date/Time   COLORURINE YELLOW 07/04/2020 1038   APPEARANCEUR CLEAR 07/04/2020 1038   LABSPEC 1.025 07/04/2020 1038   PHURINE 5.0 07/04/2020 1038   GLUCOSEU NEGATIVE 07/04/2020 1038   HGBUR MODERATE (A) 07/04/2020 1038   BILIRUBINUR NEGATIVE 07/04/2020 1038   KETONESUR 80 (A) 07/04/2020 1038   PROTEINUR >=300 (A) 07/04/2020 1038   UROBILINOGEN 1.0 03/13/2010 2259   NITRITE NEGATIVE 07/04/2020 1038   LEUKOCYTESUR NEGATIVE 07/04/2020 1038   Sepsis Labs Invalid input(s): PROCALCITONIN,  WBC,  LACTICIDVEN Microbiology Recent Results (from the past 240 hour(s))  Respiratory Panel by RT PCR (Flu A&B, Covid) - Nasopharyngeal Swab     Status: None   Collection Time: 07/04/20 12:15 PM   Specimen: Nasopharyngeal Swab  Result Value Ref Range Status   SARS Coronavirus 2 by RT PCR NEGATIVE NEGATIVE Final    Comment: (NOTE) SARS-CoV-2 target nucleic acids are NOT DETECTED.  The SARS-CoV-2 RNA is generally detectable in upper respiratoy specimens during the acute phase of infection. The lowest concentration of SARS-CoV-2 viral copies this assay can detect is 131 copies/mL. A negative result does not preclude SARS-Cov-2 infection and should not be used as the sole basis for treatment or other patient management decisions. A negative result may occur with  improper  specimen collection/handling, submission of specimen other than nasopharyngeal swab, presence of viral mutation(s) within the areas targeted by this assay, and inadequate number of viral copies (<131 copies/mL). A negative result must be combined with clinical observations, patient history, and epidemiological information. The expected result is Negative.  Fact Sheet for Patients:  https://www.moore.com/https://www.fda.gov/media/142436/download  Fact Sheet for Healthcare Providers:  https://www.young.biz/https://www.fda.gov/media/142435/download  This test is no t yet approved or cleared by the [place] FDA and  has been authorized for detection and/or diagnosis of SARS-CoV-2 by FDA under an Emergency Use Authorization (EUA). This EUA will remain  in  effect (meaning this test can be used) for the duration of the COVID-19 declaration under Section 564(b)(1) of the Act, 21 U.S.C. section 360bbb-3(b)(1), unless the authorization is terminated or revoked sooner.     Influenza A by PCR NEGATIVE NEGATIVE Final   Influenza B by PCR NEGATIVE NEGATIVE Final    Comment: (NOTE) The Xpert Xpress SARS-CoV-2/FLU/RSV assay is intended as an aid in  the diagnosis of influenza from Nasopharyngeal swab specimens and  should not be used as a sole basis for treatment. Nasal washings and  aspirates are unacceptable for Xpert Xpress SARS-CoV-2/FLU/RSV  testing.  Fact Sheet for Patients: https://www.moore.com/  Fact Sheet for Healthcare Providers: https://www.young.biz/  This test is not yet approved or cleared by the [place] FDA and  has been authorized for detection and/or diagnosis of SARS-CoV-2 by  FDA under an Emergency Use Authorization (EUA). This EUA will remain  in effect (meaning this test can be used) for the duration of the  Covid-19 declaration under Section 564(b)(1) of the Act, 21  U.S.C. section 360bbb-3(b)(1), unless the authorization is  terminated or revoked. Performed at  [facility], [address] [place], [place] [address]    Time spent: 30 min  SIGNED:   Rickey Barbara, MD  [facility] 07/05/2020, 3:45 PM  If 7PM-7AM, please contact night-coverage

## 2020-07-05 NOTE — Interval H&P Note (Signed)
History and Physical Interval Note:  07/05/2020 1:26 PM  Jay Gentry  has presented today for surgery, with the diagnosis of heme positive stool.  The various methods of treatment have been discussed with the patient and family. After consideration of risks, benefits and other options for treatment, the patient has consented to  Procedure(s): ESOPHAGOGASTRODUODENOSCOPY (EGD) (N/A) as a surgical intervention.  The patient's history has been reviewed, patient examined, no change in status, stable for surgery.  I have reviewed the patient's chart and labs.  Questions were answered to the patient's satisfaction.    Patient stable.  Tox positive for cocaine, which also partially explains overall clinical picture.  Charlie Pitter III

## 2020-07-05 NOTE — Op Note (Signed)
[facility] Patient Name: Jay Gentry Procedure Date: 07/05/2020 MRN: [id number] Attending MD: Estill Cotta. Loletha Carrow , MD Date of Birth: Dec 12, 1975 CSN: [id number] Age: 44 Admit Type: Inpatient Procedure:                Upper GI endoscopy Indications:              Coffee-ground emesis (during acute GI upset,                            elevated LFTs, EtOH and cocaine use) Hemoglobin has                            remained normal Providers:                Mallie Mussel L. Loletha Carrow, MD, Benetta Spar RN, RN, Laverda Sorenson, Technician, Eliberto Ivory, CRNA Referring MD:             [facility] Medicines:                Monitored Anesthesia Care Complications:            No immediate complications. Estimated Blood Loss:     Estimated blood loss: none. Procedure:                Pre-Anesthesia Assessment:                           - Prior to the procedure, a History and Physical                            was performed, and patient medications and                            allergies were reviewed. The patient's tolerance of                            previous anesthesia was also reviewed. The risks                            and benefits of the procedure and the sedation                            options and risks were discussed with the patient.                            All questions were answered, and informed consent                            was obtained. Prior Anticoagulants: The patient has                            taken no previous anticoagulant or antiplatelet  agents. ASA Grade Assessment: III - A patient with                            severe systemic disease. After reviewing the risks                            and benefits, the patient was deemed in                            satisfactory condition to undergo the procedure.                           After obtaining informed consent, the endoscope was                             passed under direct vision. Throughout the                            procedure, the patient's blood pressure, pulse, and                            oxygen saturations were monitored continuously. The                            [id number] ([id number]) was introduced through the                            mouth, and advanced to the second part of duodenum.                            The upper GI endoscopy was accomplished without                            difficulty. The patient tolerated the procedure                            well. Scope In: Scope Out: Findings:      LA Grade C (one or more mucosal breaks continuous between tops of 2 or       more mucosal folds, less than 75% circumference) esophagitis with no       bleeding was found at the gastroesophageal junction. Mild stricturing.      The exam of the esophagus was otherwise normal.      The stomach was normal.      The cardia and gastric fundus were normal on retroflexion.      The examined duodenum was normal. Impression:               - LA Grade C reflux esophagitis with no bleeding.                           - Normal stomach.                           - Normal examined duodenum.                           -  No specimens collected. Moderate Sedation:      MAC sedation used Recommendation:           - Return patient to hospital ward for possible                            discharge same day.                           - Use Prilosec (omeprazole) 40 mg PO BID for 4                            weeks.                           - Resume regular diet. Procedure Code(s):        --- Professional ---                           [phone], Esophagogastroduodenoscopy, flexible,                            transoral; diagnostic, including collection of                            specimen(s) by brushing or washing, when performed                            (separate procedure) Diagnosis Code(s):        --- Professional ---                            K21.00, Gastro-esophageal reflux disease with                            esophagitis, without bleeding                           K92.0, Hematemesis CPT copyright 2019 American Medical Association. All rights reserved. The codes documented in this report are preliminary and upon coder review may  be revised to meet current compliance requirements. Nanette Wirsing L. Loletha Carrow, MD 07/05/2020 1:57:59 PM This report has been signed electronically. Number of Addenda: 0

## 2020-07-05 NOTE — Plan of Care (Signed)
Instructions were reviewed with patient. All questions were answered. Patient was transported to main entrance by wheelchair. ° °

## 2020-07-05 NOTE — Anesthesia Preprocedure Evaluation (Addendum)
Anesthesia Evaluation  Patient identified by MRN, date of birth, ID band Patient awake    Reviewed: Allergy & Precautions, NPO status , Patient's Chart, lab work & pertinent test results  Airway Mallampati: II  TM Distance: >3 FB Neck ROM: Full    Dental no notable dental hx.    Pulmonary asthma , Current Smoker,  14 pack year history    Pulmonary exam normal breath sounds clear to auscultation       Cardiovascular hypertension, Pt. on medications Normal cardiovascular exam Rhythm:Regular Rate:Normal     Neuro/Psych negative neurological ROS     GI/Hepatic (+)     substance abuse  alcohol use, Mildly elevated AST/ALT Heme positive stool Hx perirectal abscess  seen by PCP yesterday with complaints of central abdominal pain, nausea/vomiting and black stools x3 episodes which started on Tues 10/19.  He developed nausea and vomited clear to bile tinged non bloody emesis with associated central to lower abdominal pain and he passed a 3 loose black stools   Endo/Other  negative endocrine ROS  Renal/GU Renal InsufficiencyRenal diseaseCr 1.13 from 1.35 yesterday   negative genitourinary   Musculoskeletal negative musculoskeletal ROS (+)   Abdominal   Peds  Hematology negative hematology ROS (+) H/H 15/45.1, plt 133   Anesthesia Other Findings   Reproductive/Obstetrics negative OB ROS                            Anesthesia Physical Anesthesia Plan  ASA: III  Anesthesia Plan: MAC   Post-op Pain Management:    Induction:   PONV Risk Score and Plan: 2 and Propofol infusion and TIVA  Airway Management Planned: Natural Airway and Simple Face Mask  Additional Equipment: None  Intra-op Plan:   Post-operative Plan:   Informed Consent: I have reviewed the patients History and Physical, chart, labs and discussed the procedure including the risks, benefits and alternatives for the proposed  anesthesia with the patient or authorized representative who has indicated his/her understanding and acceptance.       Plan Discussed with: CRNA  Anesthesia Plan Comments:       Anesthesia Quick Evaluation

## 2020-07-05 NOTE — Transfer of Care (Signed)
Immediate Anesthesia Transfer of Care Note  Patient: Jay Gentry  Procedure(s) Performed: Procedure(s): ESOPHAGOGASTRODUODENOSCOPY (EGD) (N/A)  Patient Location: PACU and Endoscopy Unit  Anesthesia Type:MAc  Level of Consciousness: awake, alert  and oriented  Airway & Oxygen Therapy: Patient Spontanous Breathing and Patient connected to nasal cannula oxygen  Post-op Assessment: Report given to RN and Post -op Vital signs reviewed and stable  Post vital signs: Reviewed and stable  Last Vitals:  Vitals:   07/05/20 1008 07/05/20 1248  BP: 134/84 (!) 141/80  Pulse: 68 67  Resp: 18 10  Temp: 36.9 C   SpO2: 37% 94%    Complications: No apparent anesthesia complications

## 2020-07-06 LAB — ANA: Anti Nuclear Antibody (ANA): NEGATIVE

## 2020-07-06 LAB — MITOCHONDRIAL ANTIBODIES: Mitochondrial M2 Ab, IgG: <20 Units (ref 0.0–20.0)

## 2020-07-07 ENCOUNTER — Telehealth: Payer: Self-pay

## 2020-07-07 ENCOUNTER — Encounter (HOSPITAL_COMMUNITY): Payer: Self-pay | Admitting: Gastroenterology

## 2020-07-07 LAB — ANTI-SMOOTH MUSCLE ANTIBODY, IGG: F-Actin IgG: 7 U (ref 0–19)

## 2020-07-07 NOTE — Telephone Encounter (Signed)
Transition Care Management Unsuccessful Follow-up Telephone Call  Date of discharge and from where:  07/05/2020 from [facility]  Attempts:  1st Attempt  Reason for unsuccessful TCM follow-up call:  Left voice message

## 2020-11-16 ENCOUNTER — Emergency Department (HOSPITAL_COMMUNITY): Payer: BC Managed Care – PPO

## 2020-11-16 ENCOUNTER — Other Ambulatory Visit: Payer: Self-pay

## 2020-11-16 ENCOUNTER — Encounter (HOSPITAL_COMMUNITY): Payer: Self-pay | Admitting: Emergency Medicine

## 2020-11-16 ENCOUNTER — Emergency Department (HOSPITAL_COMMUNITY)
Admission: EM | Admit: 2020-11-16 | Discharge: 2020-11-16 | Disposition: A | Discharge status: Home / Self Care | Payer: BC Managed Care – PPO | Attending: Emergency Medicine | Admitting: Emergency Medicine

## 2020-11-16 DIAGNOSIS — M545 Low back pain, unspecified: Secondary | ICD-10-CM | POA: Insufficient documentation

## 2020-11-16 DIAGNOSIS — R000 Tachycardia, unspecified: Secondary | ICD-10-CM | POA: Diagnosis not present

## 2020-11-16 DIAGNOSIS — Z79899 Other long term (current) drug therapy: Secondary | ICD-10-CM | POA: Diagnosis not present

## 2020-11-16 DIAGNOSIS — R0789 Other chest pain: Secondary | ICD-10-CM | POA: Diagnosis not present

## 2020-11-16 DIAGNOSIS — R42 Dizziness and giddiness: Secondary | ICD-10-CM | POA: Insufficient documentation

## 2020-11-16 DIAGNOSIS — R0602 Shortness of breath: Secondary | ICD-10-CM | POA: Diagnosis not present

## 2020-11-16 DIAGNOSIS — Z7982 Long term (current) use of aspirin: Secondary | ICD-10-CM | POA: Diagnosis not present

## 2020-11-16 DIAGNOSIS — R002 Palpitations: Secondary | ICD-10-CM | POA: Diagnosis not present

## 2020-11-16 DIAGNOSIS — Z7951 Long term (current) use of inhaled steroids: Secondary | ICD-10-CM | POA: Insufficient documentation

## 2020-11-16 DIAGNOSIS — F17210 Nicotine dependence, cigarettes, uncomplicated: Secondary | ICD-10-CM | POA: Insufficient documentation

## 2020-11-16 DIAGNOSIS — J4540 Moderate persistent asthma, uncomplicated: Secondary | ICD-10-CM | POA: Diagnosis not present

## 2020-11-16 DIAGNOSIS — I10 Essential (primary) hypertension: Secondary | ICD-10-CM | POA: Diagnosis not present

## 2020-11-16 LAB — CBC
HCT: 48.9 % (ref 39.0–52.0)
Hemoglobin: 16.2 g/dL (ref 13.0–17.0)
MCH: 29.8 pg (ref 26.0–34.0)
MCHC: 33.1 g/dL (ref 30.0–36.0)
MCV: 89.9 fL (ref 80.0–100.0)
Platelets: 220 10*3/uL (ref 150–400)
RBC: 5.44 MIL/uL (ref 4.22–5.81)
RDW: 14.2 % (ref 11.5–15.5)
WBC: 4.8 10*3/uL (ref 4.0–10.5)
nRBC: 0 % (ref 0.0–0.2)

## 2020-11-16 LAB — BASIC METABOLIC PANEL
Anion gap: 18 — ABNORMAL HIGH (ref 5–15)
BUN: 10 mg/dL (ref 6–20)
CO2: 18 mmol/L — ABNORMAL LOW (ref 22–32)
Calcium: 8.7 mg/dL — ABNORMAL LOW (ref 8.9–10.3)
Chloride: 102 mmol/L (ref 98–111)
Creatinine, Ser: 1.27 mg/dL — ABNORMAL HIGH (ref 0.61–1.24)
GFR, Estimated: >60 mL/min (ref >60)
Glucose, Bld: 72 mg/dL (ref 70–99)
Potassium: 3.6 mmol/L (ref 3.5–5.1)
Sodium: 138 mmol/L (ref 135–145)

## 2020-11-16 LAB — TROPONIN I (HIGH SENSITIVITY)
Troponin I (High Sensitivity): 11 ng/L (ref <18)
Troponin I (High Sensitivity): 14 ng/L (ref <18)

## 2020-11-16 LAB — D-DIMER, QUANTITATIVE: D-Dimer, Quant: 0.76 ug/mL-FEU — ABNORMAL HIGH (ref 0.00–0.50)

## 2020-11-16 MED ORDER — IOHEXOL 350 MG/ML SOLN
75.0000 mL | Freq: Once | INTRAVENOUS | Status: AC | PRN
  Administered 2020-11-16: 75 mL via INTRAVENOUS

## 2020-11-16 MED ORDER — ACETAMINOPHEN 500 MG PO TABS
1000.0000 mg | ORAL_TABLET | Freq: Once | ORAL | Status: AC
  Administered 2020-11-16: 1000 mg via ORAL
  Filled 2020-11-16: qty 2

## 2020-11-16 MED ORDER — SODIUM CHLORIDE 0.9 % IV BOLUS
1000.0000 mL | Freq: Once | INTRAVENOUS | Status: AC
  Administered 2020-11-16: 1000 mL via INTRAVENOUS

## 2020-11-16 NOTE — ED Triage Notes (Signed)
Pt to triage via [facility] from home.  Pt reports chest pain, heart racing, dizziness, and SOB x 4 hours.  Used inhaler without relief.  HR initially 120-140s.  18g LFA.  NS 900cc and ASA 324mg  PTA.  HR down to 100 on arrival.  BP 142/86- Pain relieved on arrival.  Reports palpitations.
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
## 2020-11-16 NOTE — ED Notes (Signed)
E-signature pad unavailable at time of pt discharge. This RN discussed discharge materials with pt and answered all pt questions. Pt stated understanding of discharge material. ? ?

## 2020-12-01 ENCOUNTER — Other Ambulatory Visit: Payer: Self-pay | Admitting: Internal Medicine

## 2020-12-01 NOTE — Telephone Encounter (Signed)
Please refill as per office routine med refill policy (all routine meds refilled for 3 mo or monthly per pt preference up to one year from last visit, then month to month grace period for 3 mo, then further med refills will have to be denied)  

## 2020-12-02 NOTE — Telephone Encounter (Signed)
Patient calling, states he needs it because he is completely out

## 2020-12-02 NOTE — Telephone Encounter (Signed)
Nigh Call : Caller states he has been waiting on his doctor to send refill on inhaler to [facility]

## 2021-01-19 ENCOUNTER — Telehealth: Payer: Self-pay | Admitting: Internal Medicine

## 2021-01-19 DIAGNOSIS — I10 Essential (primary) hypertension: Secondary | ICD-10-CM

## 2021-01-19 MED ORDER — ALBUTEROL SULFATE HFA 108 (90 BASE) MCG/ACT IN AERS: INHALATION_SPRAY | RESPIRATORY_TRACT | 0 refills | Status: DC

## 2021-01-19 MED ORDER — ALBUTEROL SULFATE (2.5 MG/3ML) 0.083% IN NEBU: 2.5000 mg | INHALATION_SOLUTION | RESPIRATORY_TRACT | 0 refills | Status: DC | PRN

## 2021-01-19 MED ORDER — AMLODIPINE BESYLATE 5 MG PO TABS: ORAL_TABLET | ORAL | 0 refills | Status: DC

## 2021-01-19 NOTE — Telephone Encounter (Signed)
Prescriptions sent to pharmacy

## 2021-01-19 NOTE — Telephone Encounter (Signed)
   Patient requesting refill for  albuterol (PROVENTIL) (2.5 MG/3ML) 0.083% nebulizer solution  albuterol (VENTOLIN HFA) 108 (90 Base) MCG/ACT inhaler  amLODipine (NORVASC) 5 MG tablet   Pharmacy [id number] - [place], [place] - [address] AT [facility] OF [address]

## 2021-02-01 ENCOUNTER — Encounter (HOSPITAL_COMMUNITY): Payer: Self-pay | Admitting: Emergency Medicine

## 2021-02-01 ENCOUNTER — Emergency Department (HOSPITAL_COMMUNITY): Payer: BC Managed Care – PPO

## 2021-02-01 ENCOUNTER — Emergency Department (HOSPITAL_COMMUNITY)
Admission: EM | Admit: 2021-02-01 | Discharge: 2021-02-01 | Disposition: A | Discharge status: Home / Self Care | Payer: BC Managed Care – PPO | Attending: Emergency Medicine | Admitting: Emergency Medicine

## 2021-02-01 DIAGNOSIS — F17210 Nicotine dependence, cigarettes, uncomplicated: Secondary | ICD-10-CM | POA: Insufficient documentation

## 2021-02-01 DIAGNOSIS — E8729 Other acidosis: Secondary | ICD-10-CM

## 2021-02-01 DIAGNOSIS — Z79899 Other long term (current) drug therapy: Secondary | ICD-10-CM | POA: Diagnosis not present

## 2021-02-01 DIAGNOSIS — I10 Essential (primary) hypertension: Secondary | ICD-10-CM | POA: Insufficient documentation

## 2021-02-01 DIAGNOSIS — E782 Mixed hyperlipidemia: Secondary | ICD-10-CM | POA: Insufficient documentation

## 2021-02-01 DIAGNOSIS — J45909 Unspecified asthma, uncomplicated: Secondary | ICD-10-CM | POA: Insufficient documentation

## 2021-02-01 DIAGNOSIS — Z760 Encounter for issue of repeat prescription: Secondary | ICD-10-CM | POA: Insufficient documentation

## 2021-02-01 DIAGNOSIS — E860 Dehydration: Secondary | ICD-10-CM | POA: Diagnosis not present

## 2021-02-01 DIAGNOSIS — F10988 Alcohol use, unspecified with other alcohol-induced disorder: Secondary | ICD-10-CM | POA: Diagnosis not present

## 2021-02-01 DIAGNOSIS — R002 Palpitations: Secondary | ICD-10-CM | POA: Diagnosis not present

## 2021-02-01 DIAGNOSIS — R000 Tachycardia, unspecified: Secondary | ICD-10-CM | POA: Diagnosis not present

## 2021-02-01 DIAGNOSIS — E872 Acidosis: Secondary | ICD-10-CM

## 2021-02-01 LAB — BASIC METABOLIC PANEL
Anion gap: 25 — ABNORMAL HIGH (ref 5–15)
Anion gap: 18 — ABNORMAL HIGH (ref 5–15)
BUN: 16 mg/dL (ref 6–20)
BUN: 14 mg/dL (ref 6–20)
CO2: 15 mmol/L — ABNORMAL LOW (ref 22–32)
CO2: 18 mmol/L — ABNORMAL LOW (ref 22–32)
Calcium: 9.3 mg/dL (ref 8.9–10.3)
Calcium: 8.3 mg/dL — ABNORMAL LOW (ref 8.9–10.3)
Chloride: 95 mmol/L — ABNORMAL LOW (ref 98–111)
Chloride: 100 mmol/L (ref 98–111)
Creatinine, Ser: 1.49 mg/dL — ABNORMAL HIGH (ref 0.61–1.24)
Creatinine, Ser: 1.48 mg/dL — ABNORMAL HIGH (ref 0.61–1.24)
GFR, Estimated: 59 mL/min — ABNORMAL LOW (ref >60)
GFR, Estimated: 59 mL/min — ABNORMAL LOW (ref >60)
Glucose, Bld: 81 mg/dL (ref 70–99)
Glucose, Bld: 87 mg/dL (ref 70–99)
Potassium: 4.7 mmol/L (ref 3.5–5.1)
Potassium: 5.4 mmol/L — ABNORMAL HIGH (ref 3.5–5.1)
Sodium: 135 mmol/L (ref 135–145)
Sodium: 136 mmol/L (ref 135–145)

## 2021-02-01 LAB — CBC WITH DIFFERENTIAL/PLATELET
Abs Immature Granulocytes: 0.04 10*3/uL (ref 0.00–0.07)
Basophils Absolute: 0 10*3/uL (ref 0.0–0.1)
Basophils Relative: 0 %
Eosinophils Absolute: 0 10*3/uL (ref 0.0–0.5)
Eosinophils Relative: 0 %
HCT: 46.5 % (ref 39.0–52.0)
Hemoglobin: 15.9 g/dL (ref 13.0–17.0)
Immature Granulocytes: 0 %
Lymphocytes Relative: 6 %
Lymphs Abs: 0.6 10*3/uL — ABNORMAL LOW (ref 0.7–4.0)
MCH: 31.9 pg (ref 26.0–34.0)
MCHC: 34.2 g/dL (ref 30.0–36.0)
MCV: 93.2 fL (ref 80.0–100.0)
Monocytes Absolute: 0.5 10*3/uL (ref 0.1–1.0)
Monocytes Relative: 5 %
Neutro Abs: 8.8 10*3/uL — ABNORMAL HIGH (ref 1.7–7.7)
Neutrophils Relative %: 89 %
Platelets: 230 10*3/uL (ref 150–400)
RBC: 4.99 MIL/uL (ref 4.22–5.81)
RDW: 14.2 % (ref 11.5–15.5)
WBC: 9.9 10*3/uL (ref 4.0–10.5)
nRBC: 0 % (ref 0.0–0.2)

## 2021-02-01 LAB — URINALYSIS, ROUTINE W REFLEX MICROSCOPIC
Bacteria, UA: NONE SEEN
Bilirubin Urine: NEGATIVE
Glucose, UA: NEGATIVE mg/dL
Ketones, ur: 80 mg/dL — AB
Leukocytes,Ua: NEGATIVE
Nitrite: NEGATIVE
Protein, ur: >=300 mg/dL — AB
Specific Gravity, Urine: 1.02 (ref 1.005–1.030)
pH: 5 (ref 5.0–8.0)

## 2021-02-01 MED ORDER — ACETAMINOPHEN 325 MG PO TABS
650.0000 mg | ORAL_TABLET | Freq: Once | ORAL | Status: AC
  Administered 2021-02-01: 650 mg via ORAL
  Filled 2021-02-01: qty 2

## 2021-02-01 MED ORDER — ONDANSETRON 4 MG PO TBDP
4.0000 mg | ORAL_TABLET | Freq: Once | ORAL | Status: AC
  Administered 2021-02-01: 4 mg via ORAL
  Filled 2021-02-01: qty 1

## 2021-02-01 MED ORDER — ALBUTEROL SULFATE HFA 108 (90 BASE) MCG/ACT IN AERS
1.0000 | INHALATION_SPRAY | Freq: Once | RESPIRATORY_TRACT | Status: AC
  Administered 2021-02-01: 1 via RESPIRATORY_TRACT
  Filled 2021-02-01: qty 6.7

## 2021-02-01 MED ORDER — OXYCODONE-ACETAMINOPHEN 5-325 MG PO TABS
1.0000 | ORAL_TABLET | Freq: Once | ORAL | Status: AC
  Administered 2021-02-01: 1 via ORAL
  Filled 2021-02-01: qty 1

## 2021-02-01 MED ORDER — ONDANSETRON HCL 4 MG/2ML IJ SOLN: 4.0000 mg | Freq: Once | INTRAMUSCULAR | Status: DC

## 2021-02-01 MED ORDER — LORAZEPAM 2 MG/ML IJ SOLN
1.0000 mg | Freq: Once | INTRAMUSCULAR | Status: AC
  Administered 2021-02-01: 1 mg via INTRAVENOUS
  Filled 2021-02-01: qty 1

## 2021-02-01 MED ORDER — SODIUM CHLORIDE 0.9 % IV BOLUS
1000.0000 mL | Freq: Once | INTRAVENOUS | Status: AC
  Administered 2021-02-01: 1000 mL via INTRAVENOUS

## 2021-02-01 NOTE — ED Notes (Signed)
In process of discharging pt, and he requested Prilosec and nausea medication.

## 2021-02-01 NOTE — ED Triage Notes (Signed)
Patient here from home via EMS reporting that he is out of his inhaler. Also reports that he is going through a separation and has anxiety and feel overwhelmed.

## 2021-02-01 NOTE — Discharge Instructions (Signed)
  You were evaluated in the Emergency Department and after careful evaluation, we did not find any emergent condition requiring admission or further testing in the hospital.   Your exam/testing today was overall reassuring.  Symptoms seem to be due to dehydration from alcohol use.  I want you to follow-up with your primary care to get all of your labs rechecked including your electrolytes in the next couple of days.  Try to refrain from alcohol and make sure to stay hydrated.  Your albuterol inhaler was refilled today. Please return to the Emergency Department if you experience any worsening of your condition.  Thank you for allowing [place] to be a part of your care. Please speak to your pharmacist about any new medications prescribed today in regards to side effects or interactions with other medications.
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
## 2021-02-01 NOTE — ED Notes (Signed)
Patient transported to X-ray 

## 2021-02-20 ENCOUNTER — Other Ambulatory Visit: Payer: Self-pay

## 2021-02-20 MED ORDER — ALBUTEROL SULFATE HFA 108 (90 BASE) MCG/ACT IN AERS: INHALATION_SPRAY | RESPIRATORY_TRACT | 0 refills | Status: DC

## 2021-03-18 ENCOUNTER — Ambulatory Visit (HOSPITAL_COMMUNITY): Payer: BC Managed Care – PPO | Admitting: Behavioral Health

## 2021-03-19 ENCOUNTER — Ambulatory Visit (HOSPITAL_COMMUNITY): Payer: BC Managed Care – PPO | Admitting: Behavioral Health

## 2021-03-20 ENCOUNTER — Other Ambulatory Visit: Payer: Self-pay | Admitting: Internal Medicine

## 2021-03-20 NOTE — Telephone Encounter (Signed)
LVM instructing pt to return call to schedule OV.  Medication has been refilled per protocol once, so refill will be denied this time. Last OV 02/12/19

## 2021-03-25 ENCOUNTER — Other Ambulatory Visit: Payer: Self-pay

## 2021-03-25 ENCOUNTER — Ambulatory Visit (INDEPENDENT_AMBULATORY_CARE_PROVIDER_SITE_OTHER): Payer: BC Managed Care – PPO | Admitting: Internal Medicine

## 2021-03-25 ENCOUNTER — Encounter: Payer: Self-pay | Admitting: Internal Medicine

## 2021-03-25 VITALS — BP 126/82 | HR 85 | Temp 98.6°F | Ht 69.0 in | Wt 182.0 lb

## 2021-03-25 DIAGNOSIS — E538 Deficiency of other specified B group vitamins: Secondary | ICD-10-CM

## 2021-03-25 DIAGNOSIS — E559 Vitamin D deficiency, unspecified: Secondary | ICD-10-CM | POA: Diagnosis not present

## 2021-03-25 DIAGNOSIS — R739 Hyperglycemia, unspecified: Secondary | ICD-10-CM | POA: Diagnosis not present

## 2021-03-25 DIAGNOSIS — Z0001 Encounter for general adult medical examination with abnormal findings: Secondary | ICD-10-CM

## 2021-03-25 DIAGNOSIS — J4531 Mild persistent asthma with (acute) exacerbation: Secondary | ICD-10-CM | POA: Diagnosis not present

## 2021-03-25 DIAGNOSIS — I10 Essential (primary) hypertension: Secondary | ICD-10-CM

## 2021-03-25 DIAGNOSIS — J45901 Unspecified asthma with (acute) exacerbation: Secondary | ICD-10-CM | POA: Insufficient documentation

## 2021-03-25 DIAGNOSIS — F1011 Alcohol abuse, in remission: Secondary | ICD-10-CM

## 2021-03-25 DIAGNOSIS — Z720 Tobacco use: Secondary | ICD-10-CM | POA: Diagnosis not present

## 2021-03-25 MED ORDER — PREDNISONE 10 MG PO TABS: ORAL_TABLET | ORAL | 0 refills | Status: DC

## 2021-03-25 MED ORDER — AMLODIPINE BESYLATE 5 MG PO TABS: ORAL_TABLET | ORAL | 3 refills | Status: DC

## 2021-03-25 MED ORDER — ALBUTEROL SULFATE (2.5 MG/3ML) 0.083% IN NEBU: 2.5000 mg | INHALATION_SOLUTION | RESPIRATORY_TRACT | 11 refills | Status: DC | PRN

## 2021-03-25 MED ORDER — ALBUTEROL SULFATE HFA 108 (90 BASE) MCG/ACT IN AERS: INHALATION_SPRAY | RESPIRATORY_TRACT | 11 refills | Status: DC

## 2021-03-25 MED ORDER — OMEPRAZOLE 40 MG PO CPDR: 40.0000 mg | DELAYED_RELEASE_CAPSULE | Freq: Every day | ORAL | 3 refills | Status: DC

## 2021-03-25 NOTE — Patient Instructions (Signed)
You had the steroid shot today  Please take all new medication as prescribed - the prednisone  Please continue all other medications as before, and refills have been done if requested - all other meds  Please have the pharmacy call with any other refills you may need.  Please continue your efforts at being more active, low cholesterol diet, and weight control.  You are otherwise up to date with prevention measures today.  Please keep your appointments with your specialists as you may have planned  Please go to the LAB at the blood drawing area for the tests to be done - at the [facility] at your convenience  You will be contacted by phone if any changes need to be made immediately.  Otherwise, you will receive a letter about your results with an explanation, but please check with MyChart first.  Please remember to sign up for MyChart if you have not done so, as this will be important to you in the future with finding out test results, communicating by private email, and scheduling acute appointments online when needed.  Please make an Appointment to return for your 1 year visit, or sooner if needed

## 2021-03-25 NOTE — Progress Notes (Addendum)
Patient ID: Jay Gentry, male   DOB: Jun 26, 1976, 45 y.o.   MRN: [id number]         Chief Complaint:: wellness exam and asthma exacerbation, smoking, alcohol detox       HPI:  Jay Gentry is a 45 y.o. male here for wellness exam; declines pneumovax, o/w up to date with preventive referrals and immunizations                        Also Pt denies chest pain, increased sob or doe, wheezing, orthopnea, PND, increased LE swelling, palpitations, dizziness or syncope except for non prod cough and wheezing /sob/doe mild x 1 week, denies fever, HA, ST as well.  Has just finished alcohol rehab/detox successfully.  Denies worsening depressive symptoms, suicidal ideation, or panic; has ongoing anxiety, Still smoking, not ready to quit.   Pt denies polydipsia, polyuria, or new focal neuro s/s.   Pt denies fever, wt loss, night sweats, loss of appetite, or other constitutional symptoms  No other new complaints  Wt Readings from Last 3 Encounters:  03/25/21 182 lb (82.6 kg)  07/04/20 164 lb 4.8 oz (74.5 kg)  07/04/20 165 lb (74.8 kg)   BP Readings from Last 3 Encounters:  03/25/21 126/82  02/01/21 136/90  11/16/20 140/83   Immunization History  Administered Date(s) Administered   Tdap 11/06/2016  There are no preventive care reminders to display for this patient.    Past Medical History:  Diagnosis Date   Acute upper GI bleeding 07/04/2020   Asthma    Bronchitis    Hypertension    Perirectal abscess 05/14/2015   Past Surgical History:  Procedure Laterality Date   ESOPHAGOGASTRODUODENOSCOPY N/A 07/05/2020   Procedure: ESOPHAGOGASTRODUODENOSCOPY (EGD);  Surgeon: Sherrilyn Rist, MD;  Location: [facility];  Service: Gastroenterology;  Laterality: N/A;   INCISION AND DRAINAGE PERIRECTAL ABSCESS  05/14/2015   INCISION AND DRAINAGE PERIRECTAL ABSCESS N/A 05/14/2015   Procedure: DRAINAGE PERIRECTAL ABSCESS;  Surgeon: Manus Rudd, MD;  Location: [facility];  Service: General;  Laterality:  N/A;   INCISION AND DRAINAGE PERIRECTAL ABSCESS N/A 11/09/2016   Procedure: IRRIGATION AND DEBRIDEMENT PERIRECTAL ABSCESS;  Surgeon: Glenna Fellows, MD;  Location: [facility];  Service: General;  Laterality: N/A;    reports that he has been smoking cigarettes. He has a 13.50 pack-year smoking history. He has never used smokeless tobacco. He reports current alcohol use. He reports previous drug use. family history includes Asthma in his mother. Allergies  Allergen Reactions   Penicillins Hives    Has patient had a PCN reaction causing immediate rash, facial/tongue/throat swelling, SOB or lightheadedness with hypotension: yes Has patient had a PCN reaction causing severe rash involving mucus membranes or skin necrosis: no Has patient had a PCN reaction that required hospitalization no Has patient had a PCN reaction occurring within the last 10 years: no If all of the above answers are "NO", then may proceed with Cephalosporin use.    Penicillins Hives    Did it involve swelling of the face/tongue/throat, SOB, or low BP? No Did it involve sudden or severe rash/hives, skin peeling, or any reaction on the inside of your mouth or nose?  Did you need to seek medical attention at a hospital or doctor's office? Unknown When did it last happen?      childhood If all above answers are "NO", may proceed with cephalosporin use.   Current Outpatient Medications on File Prior to Visit  Medication Sig  Dispense Refill   acetaminophen (TYLENOL) 500 MG tablet Take 2 tablets (1,000 mg total) by mouth every 8 (eight) hours as needed for mild pain or headache.     aspirin EC 81 MG tablet Take 81-162 mg by mouth every 6 (six) hours as needed for moderate pain. Swallow whole.     nicotine (NICODERM CQ - DOSED IN MG/24 HR) 7 mg/24hr patch Place 1 patch (7 mg total) onto the skin daily as needed (tobacco dependence). 28 patch 0   No current facility-administered medications on file prior to visit.        ROS:  All  others reviewed and negative.  Objective        PE:  BP 126/82 (BP Location: Left Arm, Patient Position: Sitting, Cuff Size: Normal)   Pulse 85   Temp 98.6 F (37 C) (Oral)   Ht 5\' 9"  (1.753 m)   Wt 182 lb (82.6 kg)   SpO2 97%   BMI 26.88 kg/m                 Constitutional: Pt appears in NAD               HENT: Head: NCAT.                Right Ear: External ear normal.                 Left Ear: External ear normal.                Eyes: . Pupils are equal, round, and reactive to light. Conjunctivae and EOM are normal               Nose: without d/c or deformity               Neck: Neck supple. Gross normal ROM               Cardiovascular: Normal rate and regular rhythm.                 Pulmonary/Chest: Effort normal and breath sounds without rales bu has bilat mild wheezing.                Abd:  Soft, NT, ND, + BS, no organomegaly               Neurological: Pt is alert. At baseline orientation, motor grossly intact               Skin: Skin is warm. No rashes, no other new lesions, LE edema - none               Psychiatric: Pt behavior is normal without agitation   Micro: none  Cardiac tracings I have personally interpreted today:  none  Pertinent Radiological findings (summarize): none   Lab Results  Component Value Date   WBC 9.9 02/01/2021   HGB 15.9 02/01/2021   HCT 46.5 02/01/2021   PLT 230 02/01/2021   GLUCOSE 87 02/01/2021   TRIG 475 (H) 11/10/2018   ALT 74 (H) 07/05/2020   AST 83 (H) 07/05/2020   NA 136 02/01/2021   K 5.4 (H) 02/01/2021   CL 100 02/01/2021   CREATININE 1.48 (H) 02/01/2021   BUN 14 02/01/2021   CO2 18 (L) 02/01/2021   TSH 1.44 05/04/2018   INR 1.0 07/04/2020   HGBA1C 6.0 05/22/2018   Assessment/Plan:  Jay Gentry is a 45 y.o. Black or African American [2] male with  has a past medical history of Acute upper GI bleeding (07/04/2020), Asthma, Bronchitis, Hypertension, and Perirectal abscess (05/14/2015).  Encounter for well adult  exam with abnormal findings Age and sex appropriate education and counseling updated with regular exercise and diet Referrals for preventative services - none needed Immunizations addressed - decines pneumovax Smoking counseling  - counseled to quit, pt not ready Evidence for depression or other mood disorder - stable anxiety Most recent labs reviewed. I have personally reviewed and have noted: 1) the patient's medical and social history 2) The patient's current medications and supplements 3) The patient's height, weight, and BMI have been recorded in the chart   Alcohol abuse, in remission S/p recent detox/rehab successfully, hopefully to remain abstinent  Tobacco abuse counseed to quit, pt not ready  Asthma exacerbation Mild to mod, for depomedrol im 80, and predpac asd, to f/u any worsening symptoms or concerns  Essential hypertension BP Readings from Last 3 Encounters:  03/25/21 126/82  02/01/21 136/90  11/16/20 140/83   Stable, pt to continue medical treatment norvasc   Hyperglycemia Lab Results  Component Value Date   HGBA1C 6.0 05/22/2018   Stable, pt to continue current medical treatment  - diet  Followup: Return in about 1 year (around 03/25/2022).  Oliver Barre, MD 03/29/2021 2:19 PM [facility]

## 2021-03-29 ENCOUNTER — Encounter: Payer: Self-pay | Admitting: Internal Medicine

## 2021-03-29 NOTE — Assessment & Plan Note (Signed)
Age and sex appropriate education and counseling updated with regular exercise and diet Referrals for preventative services - none needed Immunizations addressed - decines pneumovax Smoking counseling  - counseled to quit, pt not ready Evidence for depression or other mood disorder - stable anxiety Most recent labs reviewed. I have personally reviewed and have noted: 1) the patient's medical and social history 2) The patient's current medications and supplements 3) The patient's height, weight, and BMI have been recorded in the chart

## 2021-03-29 NOTE — Assessment & Plan Note (Signed)
Mild to mod, for depomedrol im 80, and predpac asd, to f/u any worsening symptoms or concerns

## 2021-03-29 NOTE — Assessment & Plan Note (Signed)
BP Readings from Last 3 Encounters:  03/25/21 126/82  02/01/21 136/90  11/16/20 140/83   Stable, pt to continue medical treatment norvasc

## 2021-03-29 NOTE — Assessment & Plan Note (Signed)
counseed to quit, pt not ready

## 2021-03-29 NOTE — Assessment & Plan Note (Signed)
S/p recent detox/rehab successfully, hopefully to remain abstinent

## 2021-03-29 NOTE — Assessment & Plan Note (Signed)
Lab Results  Component Value Date   HGBA1C 6.0 05/22/2018   Stable, pt to continue current medical treatment  - diet

## 2021-10-01 ENCOUNTER — Ambulatory Visit: Payer: BC Managed Care – PPO | Admitting: Internal Medicine

## 2022-04-14 ENCOUNTER — Encounter: Payer: Self-pay | Admitting: Internal Medicine

## 2022-04-14 ENCOUNTER — Ambulatory Visit (INDEPENDENT_AMBULATORY_CARE_PROVIDER_SITE_OTHER): Payer: BC Managed Care – PPO | Admitting: Internal Medicine

## 2022-04-14 VITALS — BP 120/80 | HR 97 | Temp 98.7°F | Ht 69.0 in | Wt 163.6 lb

## 2022-04-14 DIAGNOSIS — Z125 Encounter for screening for malignant neoplasm of prostate: Secondary | ICD-10-CM | POA: Diagnosis not present

## 2022-04-14 DIAGNOSIS — Z720 Tobacco use: Secondary | ICD-10-CM

## 2022-04-14 DIAGNOSIS — Z1211 Encounter for screening for malignant neoplasm of colon: Secondary | ICD-10-CM

## 2022-04-14 DIAGNOSIS — Z1212 Encounter for screening for malignant neoplasm of rectum: Secondary | ICD-10-CM

## 2022-04-14 DIAGNOSIS — J4531 Mild persistent asthma with (acute) exacerbation: Secondary | ICD-10-CM

## 2022-04-14 DIAGNOSIS — Z0001 Encounter for general adult medical examination with abnormal findings: Secondary | ICD-10-CM

## 2022-04-14 DIAGNOSIS — E538 Deficiency of other specified B group vitamins: Secondary | ICD-10-CM | POA: Diagnosis not present

## 2022-04-14 DIAGNOSIS — R739 Hyperglycemia, unspecified: Secondary | ICD-10-CM

## 2022-04-14 DIAGNOSIS — E559 Vitamin D deficiency, unspecified: Secondary | ICD-10-CM | POA: Diagnosis not present

## 2022-04-14 DIAGNOSIS — I10 Essential (primary) hypertension: Secondary | ICD-10-CM | POA: Diagnosis not present

## 2022-04-14 DIAGNOSIS — F1011 Alcohol abuse, in remission: Secondary | ICD-10-CM

## 2022-04-14 LAB — BASIC METABOLIC PANEL
BUN: 11 mg/dL (ref 6–23)
CO2: 24 mEq/L (ref 19–32)
Calcium: 10.1 mg/dL (ref 8.4–10.5)
Chloride: 96 mEq/L (ref 96–112)
Creatinine, Ser: 1.2 mg/dL (ref 0.40–1.50)
GFR: 72.91 mL/min (ref >60.00)
Glucose, Bld: 105 mg/dL — ABNORMAL HIGH (ref 70–99)
Potassium: 3.4 mEq/L — ABNORMAL LOW (ref 3.5–5.1)
Sodium: 135 mEq/L (ref 135–145)

## 2022-04-14 LAB — URINALYSIS, ROUTINE W REFLEX MICROSCOPIC
Ketones, ur: 15 — AB
Leukocytes,Ua: NEGATIVE
Nitrite: NEGATIVE
Specific Gravity, Urine: >=1.03 — AB (ref 1.000–1.030)
Total Protein, Urine: 100 — AB
Urine Glucose: NEGATIVE
Urobilinogen, UA: 1 (ref 0.0–1.0)
pH: 5.5 (ref 5.0–8.0)

## 2022-04-14 LAB — CBC WITH DIFFERENTIAL/PLATELET
Basophils Absolute: 0 10*3/uL (ref 0.0–0.1)
Basophils Relative: 0.7 % (ref 0.0–3.0)
Eosinophils Absolute: 0.1 10*3/uL (ref 0.0–0.7)
Eosinophils Relative: 1.3 % (ref 0.0–5.0)
HCT: 50.9 % (ref 39.0–52.0)
Hemoglobin: 17.3 g/dL — ABNORMAL HIGH (ref 13.0–17.0)
Lymphocytes Relative: 32.5 % (ref 12.0–46.0)
Lymphs Abs: 1.7 10*3/uL (ref 0.7–4.0)
MCHC: 33.9 g/dL (ref 30.0–36.0)
MCV: 91.2 fl (ref 78.0–100.0)
Monocytes Absolute: 0.5 10*3/uL (ref 0.1–1.0)
Monocytes Relative: 9.3 % (ref 3.0–12.0)
Neutro Abs: 3 10*3/uL (ref 1.4–7.7)
Neutrophils Relative %: 56.2 % (ref 43.0–77.0)
Platelets: 199 10*3/uL (ref 150.0–400.0)
RBC: 5.58 Mil/uL (ref 4.22–5.81)
RDW: 12.9 % (ref 11.5–15.5)
WBC: 5.3 10*3/uL (ref 4.0–10.5)

## 2022-04-14 LAB — VITAMIN B12: Vitamin B-12: 1312 pg/mL — ABNORMAL HIGH (ref 211–911)

## 2022-04-14 LAB — HEMOGLOBIN A1C: Hgb A1c MFr Bld: 5.9 % (ref 4.6–6.5)

## 2022-04-14 LAB — HEPATIC FUNCTION PANEL
ALT: 48 U/L (ref 0–53)
AST: 85 U/L — ABNORMAL HIGH (ref 0–37)
Albumin: 5 g/dL (ref 3.5–5.2)
Alkaline Phosphatase: 51 U/L (ref 39–117)
Bilirubin, Direct: 0.2 mg/dL (ref 0.0–0.3)
Total Bilirubin: 1.1 mg/dL (ref 0.2–1.2)
Total Protein: 7.4 g/dL (ref 6.0–8.3)

## 2022-04-14 LAB — LIPID PANEL
Cholesterol: 339 mg/dL — ABNORMAL HIGH (ref 0–200)
HDL: 60.9 mg/dL (ref >39.00)
Total CHOL/HDL Ratio: 6
Triglycerides: 477 mg/dL — ABNORMAL HIGH (ref 0.0–149.0)

## 2022-04-14 LAB — PSA: PSA: 0.88 ng/mL (ref 0.10–4.00)

## 2022-04-14 LAB — TSH: TSH: 1.83 u[IU]/mL (ref 0.35–5.50)

## 2022-04-14 LAB — LDL CHOLESTEROL, DIRECT: Direct LDL: 171 mg/dL

## 2022-04-14 LAB — VITAMIN D 25 HYDROXY (VIT D DEFICIENCY, FRACTURES): VITD: 8.01 ng/mL — ABNORMAL LOW (ref 30.00–100.00)

## 2022-04-14 MED ORDER — ALBUTEROL SULFATE (2.5 MG/3ML) 0.083% IN NEBU: 2.5000 mg | INHALATION_SOLUTION | RESPIRATORY_TRACT | 11 refills | Status: DC | PRN

## 2022-04-14 MED ORDER — OMEPRAZOLE 40 MG PO CPDR: 40.0000 mg | DELAYED_RELEASE_CAPSULE | Freq: Every day | ORAL | 3 refills | Status: DC

## 2022-04-14 MED ORDER — ALBUTEROL SULFATE HFA 108 (90 BASE) MCG/ACT IN AERS: INHALATION_SPRAY | RESPIRATORY_TRACT | 11 refills | Status: DC

## 2022-04-14 MED ORDER — PREDNISONE 10 MG PO TABS: ORAL_TABLET | ORAL | 0 refills | Status: DC

## 2022-04-14 MED ORDER — AMLODIPINE BESYLATE 5 MG PO TABS: ORAL_TABLET | ORAL | 3 refills | Status: DC

## 2022-04-14 NOTE — Assessment & Plan Note (Signed)
Now s/p 1 wk rehab in June 2023, now very little drinking now

## 2022-04-14 NOTE — Progress Notes (Signed)
Patient ID: Jay Gentry, male   DOB: 1976-05-04, 46 y.o.   MRN: [id number]         Chief Complaint:: wellness exam and wheezing, htn, hyperglycemia,       HPI:  Jay Gentry is a 46 y.o. male here for wellness exam; due for colonoscopy, o/w up to date                        Also Pt denies chest pain, orthopnea, PND, increased LE swelling, palpitations, dizziness or syncope, but incidentally has had 3 days onset mild wheezing, sob doe, without fever, chills.  Pt denies polydipsia, polyuria, or new focal neuro s/s.     Pt denies fever, wt loss, night sweats, loss of appetite, or other constitutional symptoms  Not taking Vit D. Still smoking, not ready to quit.   Wt Readings from Last 3 Encounters:  04/14/22 163 lb 9.6 oz (74.2 kg)  03/25/21 182 lb (82.6 kg)  07/04/20 164 lb 4.8 oz (74.5 kg)   BP Readings from Last 3 Encounters:  04/14/22 120/80  03/25/21 126/82  02/01/21 136/90   Immunization History  Administered Date(s) Administered   Tdap 11/06/2016   Health Maintenance Due  Topic Date Due   COLONOSCOPY (Pts 45-19yrs Insurance coverage will need to be confirmed)  Never done      Past Medical History:  Diagnosis Date   Acute upper GI bleeding 07/04/2020   Asthma    Bronchitis    Hypertension    Perirectal abscess 05/14/2015   Past Surgical History:  Procedure Laterality Date   ESOPHAGOGASTRODUODENOSCOPY N/A 07/05/2020   Procedure: ESOPHAGOGASTRODUODENOSCOPY (EGD);  Surgeon: Sherrilyn Rist, MD;  Location: [facility];  Service: Gastroenterology;  Laterality: N/A;   INCISION AND DRAINAGE PERIRECTAL ABSCESS  05/14/2015   INCISION AND DRAINAGE PERIRECTAL ABSCESS N/A 05/14/2015   Procedure: DRAINAGE PERIRECTAL ABSCESS;  Surgeon: Manus Rudd, MD;  Location: [facility];  Service: General;  Laterality: N/A;   INCISION AND DRAINAGE PERIRECTAL ABSCESS N/A 11/09/2016   Procedure: IRRIGATION AND DEBRIDEMENT PERIRECTAL ABSCESS;  Surgeon: Glenna Fellows, MD;  Location: [facility];  Service: General;  Laterality: N/A;    reports that he has been smoking cigarettes. He has a 13.50 pack-year smoking history. He has never used smokeless tobacco. He reports current alcohol use. He reports that he does not currently use drugs. family history includes Asthma in his mother. Allergies  Allergen Reactions   Penicillins Hives    Has patient had a PCN reaction causing immediate rash, facial/tongue/throat swelling, SOB or lightheadedness with hypotension: yes Has patient had a PCN reaction causing severe rash involving mucus membranes or skin necrosis: no Has patient had a PCN reaction that required hospitalization no Has patient had a PCN reaction occurring within the last 10 years: no If all of the above answers are "NO", then may proceed with Cephalosporin use.    Penicillins Hives    Did it involve swelling of the face/tongue/throat, SOB, or low BP? No Did it involve sudden or severe rash/hives, skin peeling, or any reaction on the inside of your mouth or nose?  Did you need to seek medical attention at a hospital or doctor's office? Unknown When did it last happen?      childhood If all above answers are "NO", may proceed with cephalosporin use.   Current Outpatient Medications on File Prior to Visit  Medication Sig Dispense Refill   acetaminophen (TYLENOL) 500 MG tablet Take 2 tablets (  1,000 mg total) by mouth every 8 (eight) hours as needed for mild pain or headache.     aspirin EC 81 MG tablet Take 81-162 mg by mouth every 6 (six) hours as needed for moderate pain. Swallow whole.     No current facility-administered medications on file prior to visit.        ROS:  All others reviewed and negative.  Objective        PE:  BP 120/80 (BP Location: Right Arm, Patient Position: Sitting, Cuff Size: Normal)   Pulse 97   Temp 98.7 F (37.1 C) (Oral)   Ht 5\' 9"  (1.753 m)   Wt 163 lb 9.6 oz (74.2 kg)   SpO2 99%   BMI 24.16 kg/m                 Constitutional: Pt  appears in NAD               HENT: Head: NCAT.                Right Ear: External ear normal.                 Left Ear: External ear normal.                Eyes: . Pupils are equal, round, and reactive to light. Conjunctivae and EOM are normal               Nose: without d/c or deformity               Neck: Neck supple. Gross normal ROM               Cardiovascular: Normal rate and regular rhythm.                 Pulmonary/Chest: Effort normal and breath sounds without rales but with mild bilateral wheezing.                Abd:  Soft, NT, ND, + BS, no organomegaly               Neurological: Pt is alert. At baseline orientation, motor grossly intact               Skin: Skin is warm. No rashes, no other new lesions, LE edema - none               Psychiatric: Pt behavior is normal without agitation   Micro: none  Cardiac tracings I have personally interpreted today:  none  Pertinent Radiological findings (summarize): none   Lab Results  Component Value Date   WBC 5.3 04/14/2022   HGB 17.3 (H) 04/14/2022   HCT 50.9 04/14/2022   PLT 199.0 04/14/2022   GLUCOSE 105 (H) 04/14/2022   CHOL 339 (H) 04/14/2022   TRIG (H) 04/14/2022    477.0 Triglyceride is over 400; calculations on Lipids are invalid.   HDL 60.90 04/14/2022   LDLDIRECT 171.0 04/14/2022   ALT 48 04/14/2022   AST 85 (H) 04/14/2022   NA 135 04/14/2022   K 3.4 (L) 04/14/2022   CL 96 04/14/2022   CREATININE 1.20 04/14/2022   BUN 11 04/14/2022   CO2 24 04/14/2022   TSH 1.83 04/14/2022   PSA 0.88 04/14/2022   INR 1.0 07/04/2020   HGBA1C 5.9 04/14/2022   Assessment/Plan:  Jay Gentry is a 46 y.o. Black or African American [2] male with  has a past medical history of Acute upper  GI bleeding (07/04/2020), Asthma, Bronchitis, Hypertension, and Perirectal abscess (05/14/2015).  Alcohol abuse, in remission Now s/p 1 wk rehab in June 2023, now very little drinking now  Encounter for well adult exam with abnormal  findings Age and sex appropriate education and counseling updated with regular exercise and diet Referrals for preventative services - for colonoscopy Immunizations addressed - none needed Smoking counseling  - none needed Evidence for depression or other mood disorder - none significant Most recent labs reviewed. I have personally reviewed and have noted: 1) the patient's medical and social history 2) The patient's current medications and supplements 3) The patient's height, weight, and BMI have been recorded in the chart   Essential hypertension BP Readings from Last 3 Encounters:  04/14/22 120/80  03/25/21 126/82  02/01/21 136/90   Stable, pt to continue medical treatment norvasc 5 mg qd   Tobacco abuse Pt counsled to quit, pt not ready  Hyperglycemia Lab Results  Component Value Date   HGBA1C 5.9 04/14/2022   Stable, pt to continue current medical treatment  - diet, wt control   Asthma exacerbation Mild to mod, for prednisone taper course,  to f/u any worsening symptoms or concerns  Vitamin D deficiency Last vitamin D Lab Results  Component Value Date   VD25OH 8.01 (L) 04/14/2022   Low, to start oral replacement Followup: Return in about 1 year (around 04/15/2023).  Cathlean Cower, MD 04/16/2022 8:36 PM [facility]

## 2022-04-14 NOTE — Patient Instructions (Signed)
Please take all new medication as prescribed - the prednisone for 5 days  Please continue all other medications as before, and refills have been done if requested - the inhaler and solution  Please have the pharmacy call with any other refills you may need.  Please continue your efforts at being more active, low cholesterol diet, and weight control.  You are otherwise up to date with prevention measures today.  Please keep your appointments with your specialists as you may have planned  You will be contacted regarding the referral for: colonoscopy  Please go to the LAB at the blood drawing area for the tests to be done  You will be contacted by phone if any changes need to be made immediately.  Otherwise, you will receive a letter about your results with an explanation, but please check with MyChart first.  Please remember to sign up for MyChart if you have not done so, as this will be important to you in the future with finding out test results, communicating by private email, and scheduling acute appointments online when needed.  Please make an Appointment to return for your 1 year visit, or sooner if needed

## 2022-04-15 NOTE — Progress Notes (Signed)
Printed and mailed to address on file../l,mb

## 2022-04-16 ENCOUNTER — Encounter: Payer: Self-pay | Admitting: Internal Medicine

## 2022-04-16 DIAGNOSIS — E559 Vitamin D deficiency, unspecified: Secondary | ICD-10-CM | POA: Insufficient documentation

## 2022-04-16 NOTE — Assessment & Plan Note (Signed)
Last vitamin D Lab Results  Component Value Date   VD25OH 8.01 (L) 04/14/2022   Low, to start oral replacement

## 2022-04-16 NOTE — Assessment & Plan Note (Signed)
BP Readings from Last 3 Encounters:  04/14/22 120/80  03/25/21 126/82  02/01/21 136/90   Stable, pt to continue medical treatment norvasc 5 mg qd

## 2022-04-16 NOTE — Assessment & Plan Note (Signed)

## 2022-04-16 NOTE — Assessment & Plan Note (Signed)
Mild to mod, for prednisone taper course,  to f/u any worsening symptoms or concerns 

## 2022-04-16 NOTE — Assessment & Plan Note (Signed)
Pt counsled to quit, pt not ready °

## 2022-04-16 NOTE — Assessment & Plan Note (Signed)
Lab Results  Component Value Date   HGBA1C 5.9 04/14/2022   Stable, pt to continue current medical treatment  - diet, wt control  

## 2022-08-02 ENCOUNTER — Encounter: Payer: Self-pay | Admitting: Internal Medicine

## 2022-11-02 ENCOUNTER — Other Ambulatory Visit: Payer: Self-pay

## 2022-11-02 MED ORDER — ALBUTEROL SULFATE HFA 108 (90 BASE) MCG/ACT IN AERS: INHALATION_SPRAY | RESPIRATORY_TRACT | 6 refills | Status: DC

## 2022-11-05 IMAGING — CR DG CHEST 1V PORT
1 series · 1 of 1 positions shown · non-contrast
Comparison: 11/16/2020 and older exams.

CLINICAL DATA: Pt reported he fell backwards onto his back two days
ago. Pt complained of lower back pain since then. Pt also reported
he ran out of his inhaler and has been having trouble breathing.

EXAM:
PORTABLE CHEST 1 VIEW

[w chest pa]
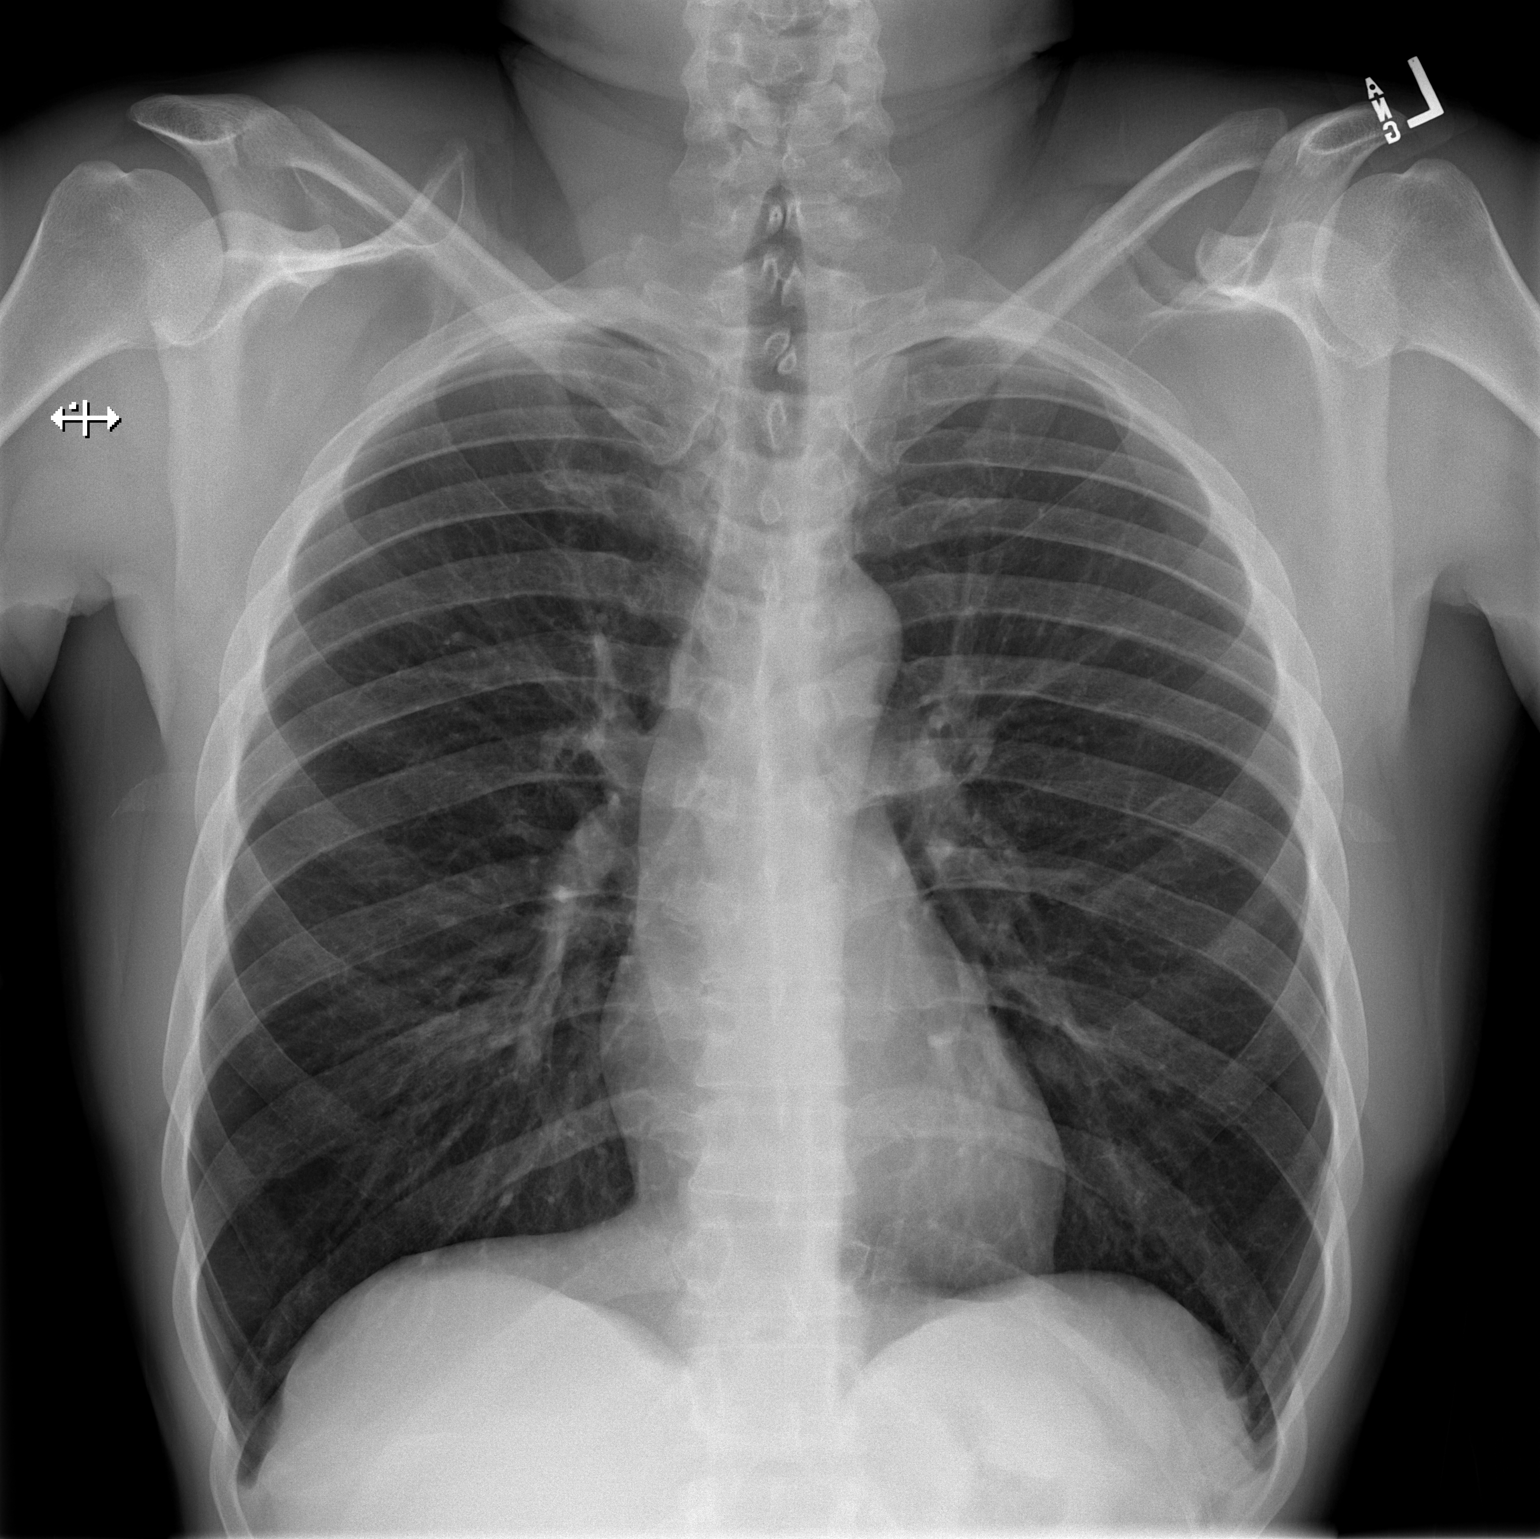

[1 of 1 positions shown; findings below may reference images not displayed]

FINDINGS: The heart size and mediastinal contours are within normal limits.
Both lungs are clear. No pleural effusion or pneumothorax. The
visualized skeletal structures are unremarkable.
IMPRESSION: No active disease.

## 2023-06-05 ENCOUNTER — Other Ambulatory Visit: Payer: Self-pay | Admitting: Internal Medicine

## 2023-06-05 DIAGNOSIS — I10 Essential (primary) hypertension: Secondary | ICD-10-CM

## 2023-06-06 ENCOUNTER — Other Ambulatory Visit: Payer: Self-pay

## 2023-06-10 ENCOUNTER — Encounter: Payer: Self-pay | Admitting: Internal Medicine

## 2023-06-10 ENCOUNTER — Encounter: Payer: BC Managed Care – PPO | Admitting: Internal Medicine

## 2023-06-10 ENCOUNTER — Ambulatory Visit (INDEPENDENT_AMBULATORY_CARE_PROVIDER_SITE_OTHER): Payer: BC Managed Care – PPO | Admitting: Internal Medicine

## 2023-06-10 VITALS — BP 126/78 | HR 90 | Temp 98.3°F | Ht 69.0 in | Wt 169.0 lb

## 2023-06-10 DIAGNOSIS — Z0000 Encounter for general adult medical examination without abnormal findings: Secondary | ICD-10-CM | POA: Diagnosis not present

## 2023-06-10 DIAGNOSIS — Z125 Encounter for screening for malignant neoplasm of prostate: Secondary | ICD-10-CM

## 2023-06-10 DIAGNOSIS — Z720 Tobacco use: Secondary | ICD-10-CM

## 2023-06-10 DIAGNOSIS — Z1211 Encounter for screening for malignant neoplasm of colon: Secondary | ICD-10-CM

## 2023-06-10 DIAGNOSIS — E559 Vitamin D deficiency, unspecified: Secondary | ICD-10-CM

## 2023-06-10 DIAGNOSIS — I10 Essential (primary) hypertension: Secondary | ICD-10-CM

## 2023-06-10 DIAGNOSIS — R739 Hyperglycemia, unspecified: Secondary | ICD-10-CM

## 2023-06-10 DIAGNOSIS — Z0001 Encounter for general adult medical examination with abnormal findings: Secondary | ICD-10-CM

## 2023-06-10 DIAGNOSIS — E538 Deficiency of other specified B group vitamins: Secondary | ICD-10-CM

## 2023-06-10 DIAGNOSIS — J4540 Moderate persistent asthma, uncomplicated: Secondary | ICD-10-CM

## 2023-06-10 MED ORDER — AMLODIPINE BESYLATE 5 MG PO TABS
ORAL_TABLET | ORAL | 3 refills | Status: AC
Start: 2023-06-10 — End: ?

## 2023-06-10 NOTE — Patient Instructions (Addendum)
Please continue all other medications as before, and refills have been done if requested.  Please have the pharmacy call with any other refills you may need.  Please continue your efforts at being more active, low cholesterol diet, and weight control.  You are otherwise up to date with prevention measures today.  Please quit smoking  Please keep your appointments with your specialists as you may have planned  You will be contacted regarding the referral for: colonoscopy  Please go to the LAB at the blood drawing area for the tests to be done  You will be contacted by phone if any changes need to be made immediately.  Otherwise, you will receive a letter about your results with an explanation, but please check with MyChart first.  Please make an Appointment to return for your 1 year visit, or sooner if needed

## 2023-06-10 NOTE — Progress Notes (Unsigned)
Patient ID: Jay Gentry, male   DOB: October 04, 1975, 47 y.o.   MRN: [id number]         Chief Complaint:: wellness exam and htn, hyperglycemia, asthma, smoker, low vit d       HPI:  Jay Gentry is a 47 y.o. male here for wellness exam; due for colonoscopy, declines flu shot, o/w up to date                        Also has some marital stress lately.  Denies worsening depressive symptoms, suicidal ideation, or panic; Pt denies chest pain, increased sob or doe, wheezing, orthopnea, PND, increased LE swelling, palpitations, dizziness or syncope.   Pt denies polydipsia, polyuria, or new focal neuro s/s.    Pt denies fever, wt loss, night sweats, loss of appetite, or other constitutional symptoms  Still smoking, not ready to quit   Wt Readings from Last 3 Encounters:  06/10/23 169 lb (76.7 kg)  04/14/22 163 lb 9.6 oz (74.2 kg)  03/25/21 182 lb (82.6 kg)   BP Readings from Last 3 Encounters:  06/10/23 126/78  04/14/22 120/80  03/25/21 126/82   Immunization History  Administered Date(s) Administered   Tdap 11/06/2016   Health Maintenance Due  Topic Date Due   Colonoscopy  Never done      Past Medical History:  Diagnosis Date   Acute upper GI bleeding 07/04/2020   Asthma    Bronchitis    Hypertension    Perirectal abscess 05/14/2015   Past Surgical History:  Procedure Laterality Date   ESOPHAGOGASTRODUODENOSCOPY N/A 07/05/2020   Procedure: ESOPHAGOGASTRODUODENOSCOPY (EGD);  Surgeon: Sherrilyn Rist, MD;  Location: [facility];  Service: Gastroenterology;  Laterality: N/A;   INCISION AND DRAINAGE PERIRECTAL ABSCESS  05/14/2015   INCISION AND DRAINAGE PERIRECTAL ABSCESS N/A 05/14/2015   Procedure: DRAINAGE PERIRECTAL ABSCESS;  Surgeon: Manus Rudd, MD;  Location: [facility];  Service: General;  Laterality: N/A;   INCISION AND DRAINAGE PERIRECTAL ABSCESS N/A 11/09/2016   Procedure: IRRIGATION AND DEBRIDEMENT PERIRECTAL ABSCESS;  Surgeon: Glenna Fellows, MD;  Location: [facility];  Service: General;  Laterality: N/A;    reports that he has been smoking cigarettes. He has a 13.5 pack-year smoking history. He has never used smokeless tobacco. He reports current alcohol use. He reports that he does not currently use drugs. family history includes Asthma in his mother. Allergies  Allergen Reactions   Penicillins Hives    Has patient had a PCN reaction causing immediate rash, facial/tongue/throat swelling, SOB or lightheadedness with hypotension: yes Has patient had a PCN reaction causing severe rash involving mucus membranes or skin necrosis: no Has patient had a PCN reaction that required hospitalization no Has patient had a PCN reaction occurring within the last 10 years: no If all of the above answers are "NO", then may proceed with Cephalosporin use.    Penicillins Hives    Did it involve swelling of the face/tongue/throat, SOB, or low BP? No Did it involve sudden or severe rash/hives, skin peeling, or any reaction on the inside of your mouth or nose?  Did you need to seek medical attention at a hospital or doctor's office? Unknown When did it last happen?      childhood If all above answers are "NO", may proceed with cephalosporin use.   Current Outpatient Medications on File Prior to Visit  Medication Sig Dispense Refill   acetaminophen (TYLENOL) 500 MG tablet Take 2 tablets (1,000 mg total)  by mouth every 8 (eight) hours as needed for mild pain or headache.     albuterol (PROVENTIL) (2.5 MG/3ML) 0.083% nebulizer solution Take 3 mLs (2.5 mg total) by nebulization every 4 (four) hours as needed for wheezing or shortness of breath. 75 mL 11   albuterol (VENTOLIN HFA) 108 (90 Base) MCG/ACT inhaler INHALE 1 TO 2 PUFFS INTO THE LUNGS EVERY 6 HOURS AS NEEDED 18 g 6   aspirin EC 81 MG tablet Take 81-162 mg by mouth every 6 (six) hours as needed for moderate pain. Swallow whole.     omeprazole (PRILOSEC) 40 MG capsule Take 1 capsule (40 mg total) by mouth daily. 90  capsule 3   No current facility-administered medications on file prior to visit.        ROS:  All others reviewed and negative.  Objective        PE:  BP 126/78 (BP Location: Left Arm, Patient Position: Sitting, Cuff Size: Normal)   Pulse 90   Temp 98.3 F (36.8 C) (Oral)   Ht 5\' 9"  (1.753 m)   Wt 169 lb (76.7 kg)   SpO2 98%   BMI 24.96 kg/m                 Constitutional: Pt appears in NAD               HENT: Head: NCAT.                Right Ear: External ear normal.                 Left Ear: External ear normal.                Eyes: . Pupils are equal, round, and reactive to light. Conjunctivae and EOM are normal               Nose: without d/c or deformity               Neck: Neck supple. Gross normal ROM               Cardiovascular: Normal rate and regular rhythm.                 Pulmonary/Chest: Effort normal and breath sounds without rales or wheezing.                Abd:  Soft, NT, ND, + BS, no organomegaly               Neurological: Pt is alert. At baseline orientation, motor grossly intact               Skin: Skin is warm. No rashes, no other new lesions, LE edema - none               Psychiatric: Pt behavior is normal without agitation   Micro: none  Cardiac tracings I have personally interpreted today:  none  Pertinent Radiological findings (summarize): none   Lab Results  Component Value Date   WBC 5.3 04/14/2022   HGB 17.3 (H) 04/14/2022   HCT 50.9 04/14/2022   PLT 199.0 04/14/2022   GLUCOSE 105 (H) 04/14/2022   CHOL 339 (H) 04/14/2022   TRIG (H) 04/14/2022    477.0 Triglyceride is over 400; calculations on Lipids are invalid.   HDL 60.90 04/14/2022   LDLDIRECT 171.0 04/14/2022   ALT 48 04/14/2022   AST 85 (H) 04/14/2022   NA 135 04/14/2022   K  3.4 (L) 04/14/2022   CL 96 04/14/2022   CREATININE 1.20 04/14/2022   BUN 11 04/14/2022   CO2 24 04/14/2022   TSH 1.83 04/14/2022   PSA 0.88 04/14/2022   INR 1.0 07/04/2020   HGBA1C 5.9 04/14/2022    Assessment/Plan:  Jay Gentry is a 47 y.o. Black or African American [2] male with  has a past medical history of Acute upper GI bleeding (07/04/2020), Asthma, Bronchitis, Hypertension, and Perirectal abscess (05/14/2015).  Encounter for well adult exam with abnormal findings Age and sex appropriate education and counseling updated with regular exercise and diet Referrals for preventative services - for colonosocpy Immunizations addressed - declines flu shot Smoking counseling  - pt counsled to quit, pt not ready Evidence for depression or other mood disorder - mild marital stress - decliens counseling referral Most recent labs reviewed. I have personally reviewed and have noted: 1) the patient's medical and social history 2) The patient's current medications and supplements 3) The patient's height, weight, and BMI have been recorded in the chart   Essential hypertension BP Readings from Last 3 Encounters:  06/10/23 126/78  04/14/22 120/80  03/25/21 126/82   Stable, pt to continue medical treatment norvasc 5 qd   Hyperglycemia Lab Results  Component Value Date   HGBA1C 5.9 04/14/2022   Stable, pt to continue current medical treatment  - diet, wt control   Tobacco abuse Pt counsled to quit, pt not ready  Moderate persistent asthma without complication Overall stable, cont inhaler prn  Vitamin D deficiency Last vitamin D Lab Results  Component Value Date   VD25OH 8.01 (L) 04/14/2022   Low to start oral replacement  Followup: No follow-ups on file.  Oliver Barre, MD 06/12/2023 4:12 PM [facility]

## 2023-06-12 NOTE — Assessment & Plan Note (Signed)
Pt counsled to quit, pt not ready °

## 2023-06-12 NOTE — Assessment & Plan Note (Signed)
Overall stable, cont inhaler prn

## 2023-06-12 NOTE — Assessment & Plan Note (Signed)
BP Readings from Last 3 Encounters:  06/10/23 126/78  04/14/22 120/80  03/25/21 126/82   Stable, pt to continue medical treatment norvasc 5 qd

## 2023-06-12 NOTE — Assessment & Plan Note (Addendum)
Age and sex appropriate education and counseling updated with regular exercise and diet Referrals for preventative services - for colonosocpy Immunizations addressed - declines flu shot Smoking counseling  - pt counsled to quit, pt not ready Evidence for depression or other mood disorder - mild marital stress - decliens counseling referral Most recent labs reviewed. I have personally reviewed and have noted: 1) the patient's medical and social history 2) The patient's current medications and supplements 3) The patient's height, weight, and BMI have been recorded in the chart

## 2023-06-12 NOTE — Assessment & Plan Note (Signed)
Lab Results  Component Value Date   HGBA1C 5.9 04/14/2022   Stable, pt to continue current medical treatment  - diet, wt control  

## 2023-06-12 NOTE — Assessment & Plan Note (Signed)
Last vitamin D Lab Results  Component Value Date   VD25OH 8.01 (L) 04/14/2022   Low to start oral replacement

## 2023-06-17 ENCOUNTER — Encounter (HOSPITAL_COMMUNITY): Payer: Self-pay

## 2023-06-17 ENCOUNTER — Other Ambulatory Visit: Payer: Self-pay

## 2023-06-17 ENCOUNTER — Emergency Department (HOSPITAL_COMMUNITY): Payer: 59

## 2023-06-17 ENCOUNTER — Emergency Department (HOSPITAL_COMMUNITY)
Admission: EM | Admit: 2023-06-17 | Discharge: 2023-06-17 | Disposition: A | Discharge status: Home / Self Care | Payer: 59

## 2023-06-17 DIAGNOSIS — R079 Chest pain, unspecified: Secondary | ICD-10-CM | POA: Insufficient documentation

## 2023-06-17 DIAGNOSIS — Z7982 Long term (current) use of aspirin: Secondary | ICD-10-CM | POA: Insufficient documentation

## 2023-06-17 DIAGNOSIS — R0602 Shortness of breath: Secondary | ICD-10-CM | POA: Insufficient documentation

## 2023-06-17 DIAGNOSIS — F17200 Nicotine dependence, unspecified, uncomplicated: Secondary | ICD-10-CM | POA: Insufficient documentation

## 2023-06-17 DIAGNOSIS — Z79899 Other long term (current) drug therapy: Secondary | ICD-10-CM | POA: Diagnosis not present

## 2023-06-17 DIAGNOSIS — R1013 Epigastric pain: Secondary | ICD-10-CM | POA: Insufficient documentation

## 2023-06-17 DIAGNOSIS — I10 Essential (primary) hypertension: Secondary | ICD-10-CM | POA: Insufficient documentation

## 2023-06-17 LAB — CBC
HCT: 53.2 % — ABNORMAL HIGH (ref 39.0–52.0)
Hemoglobin: 17.9 g/dL — ABNORMAL HIGH (ref 13.0–17.0)
MCH: 30.1 pg (ref 26.0–34.0)
MCHC: 33.6 g/dL (ref 30.0–36.0)
MCV: 89.4 fL (ref 80.0–100.0)
Platelets: 287 10*3/uL (ref 150–400)
RBC: 5.95 MIL/uL — ABNORMAL HIGH (ref 4.22–5.81)
RDW: 12.6 % (ref 11.5–15.5)
WBC: 5 10*3/uL (ref 4.0–10.5)
nRBC: 0 % (ref 0.0–0.2)

## 2023-06-17 LAB — BASIC METABOLIC PANEL
Anion gap: 18 — ABNORMAL HIGH (ref 5–15)
BUN: 13 mg/dL (ref 6–20)
CO2: 20 mmol/L — ABNORMAL LOW (ref 22–32)
Calcium: 8.8 mg/dL — ABNORMAL LOW (ref 8.9–10.3)
Chloride: 102 mmol/L (ref 98–111)
Creatinine, Ser: 1.04 mg/dL (ref 0.61–1.24)
GFR, Estimated: >60 mL/min (ref >60)
Glucose, Bld: 92 mg/dL (ref 70–99)
Potassium: 3.6 mmol/L (ref 3.5–5.1)
Sodium: 140 mmol/L (ref 135–145)

## 2023-06-17 LAB — TROPONIN I (HIGH SENSITIVITY)
Troponin I (High Sensitivity): 19 ng/L — ABNORMAL HIGH (ref <18)
Troponin I (High Sensitivity): 22 ng/L — ABNORMAL HIGH (ref <18)

## 2023-06-17 LAB — LIPASE, BLOOD: Lipase: 68 U/L — ABNORMAL HIGH (ref 11–51)

## 2023-06-17 LAB — D-DIMER, QUANTITATIVE: D-Dimer, Quant: 0.29 ug{FEU}/mL (ref 0.00–0.50)

## 2023-06-17 MED ORDER — MORPHINE SULFATE (PF) 4 MG/ML IV SOLN
4.0000 mg | Freq: Once | INTRAVENOUS | Status: AC
  Administered 2023-06-17: 4 mg via INTRAVENOUS
  Filled 2023-06-17: qty 1

## 2023-06-17 MED ORDER — SODIUM CHLORIDE 0.9 % IV BOLUS: 1000.0000 mL | Freq: Once | INTRAVENOUS | Status: DC

## 2023-06-17 MED ORDER — IBUPROFEN 200 MG PO TABS
600.0000 mg | ORAL_TABLET | Freq: Once | ORAL | Status: AC
  Administered 2023-06-17: 600 mg via ORAL
  Filled 2023-06-17: qty 3

## 2023-06-17 MED ORDER — ONDANSETRON HCL 4 MG/2ML IJ SOLN
4.0000 mg | Freq: Once | INTRAMUSCULAR | Status: AC
  Administered 2023-06-17: 4 mg via INTRAVENOUS
  Filled 2023-06-17: qty 2

## 2023-06-17 MED ORDER — SODIUM CHLORIDE 0.9 % IV BOLUS
1000.0000 mL | Freq: Once | INTRAVENOUS | Status: AC
  Administered 2023-06-17: 1000 mL via INTRAVENOUS

## 2023-06-17 MED ORDER — IOHEXOL 300 MG/ML  SOLN
100.0000 mL | Freq: Once | INTRAMUSCULAR | Status: AC | PRN
  Administered 2023-06-17: 100 mL via INTRAVENOUS

## 2023-06-17 MED ORDER — ACETAMINOPHEN 500 MG PO TABS
1000.0000 mg | ORAL_TABLET | Freq: Once | ORAL | Status: AC
  Administered 2023-06-17: 1000 mg via ORAL
  Filled 2023-06-17: qty 2

## 2023-06-17 MED ORDER — FAMOTIDINE 20 MG PO TABS
20.0000 mg | ORAL_TABLET | Freq: Once | ORAL | Status: AC
  Administered 2023-06-17: 20 mg via ORAL
  Filled 2023-06-17: qty 1

## 2023-06-17 NOTE — ED Provider Notes (Signed)
[place] EMERGENCY DEPARTMENT AT [facility] Provider Note   CSN: [id number] Arrival date & time: 06/17/23  1450     History  Chief Complaint  Patient presents with   Panic Attack   HPI Jay Gentry is a 47 y.o. male with hypertension, tobacco abuse presenting for panic attack.  States he has had a lot of stress at home particularly in his marriage.  Was feeling very anxious today and also states that he was having chest pain started around 2 PM today along with some intermittent shortness of breath.  He has had these symptoms before for the past 2 months intermittently.  He took an inhaler and was given a nebulized treatment by EMS.  At this time he states that he still feeling chest pain but it has improved somewhat.  Denies cough and fever.  Denies nausea vomiting diarrhea.  Denies cocaine use.  States he took some Tylenol around 130 today.  Also reports drinking "4 airplane bottle sized" liquors last night.  HPI     Home Medications Prior to Admission medications   Medication Sig Start Date End Date Taking? Authorizing Provider  acetaminophen (TYLENOL) 500 MG tablet Take 2 tablets (1,000 mg total) by mouth every 8 (eight) hours as needed for mild pain or headache. 11/14/18   Juliet Rude, PA-C  albuterol (PROVENTIL) (2.5 MG/3ML) 0.083% nebulizer solution Take 3 mLs (2.5 mg total) by nebulization every 4 (four) hours as needed for wheezing or shortness of breath. 04/14/22   Corwin Levins, MD  albuterol (VENTOLIN HFA) 108 (90 Base) MCG/ACT inhaler INHALE 1 TO 2 PUFFS INTO THE LUNGS EVERY 6 HOURS AS NEEDED 06/06/23   Corwin Levins, MD  amLODipine (NORVASC) 5 MG tablet TAKE 1 TABLET(5 MG) BY MOUTH DAILY 06/10/23   Corwin Levins, MD  aspirin EC 81 MG tablet Take 81-162 mg by mouth every 6 (six) hours as needed for moderate pain. Swallow whole.    [provider]  omeprazole (PRILOSEC) 40 MG capsule Take 1 capsule (40 mg total) by mouth daily. 04/14/22   Corwin Levins, MD      Allergies    Penicillins and Penicillins    Review of Systems   See HPI for pertinent positives   Physical Exam   Vitals:   06/17/23 1918 06/17/23 2229  BP: 131/83 137/79  Pulse: 88 85  Resp: 18 16  Temp:  98.2 F (36.8 C)  SpO2: 98% 97%    CONSTITUTIONAL:  well-appearing, NAD NEURO:  Alert and oriented x 3, CN 3-12 grossly intact EYES:  eyes equal and reactive ENT/NECK:  Supple, no stridor  CARDIO:  regular rate and rhythm, appears well-perfused  PULM:  No respiratory distress, CTAB GI/GU:  non-distended, soft, epigastric tenderness MSK/SPINE:  No gross deformities, no edema, moves all extremities SKIN:  no rash, atraumatic  *Additional and/or pertinent findings included in MDM below   ED Results / Procedures / Treatments   Labs (all labs ordered are listed, but only abnormal results are displayed) Labs Reviewed  BASIC METABOLIC PANEL - Abnormal; Notable for the following components:      Result Value   CO2 20 (*)    Calcium 8.8 (*)    Anion gap 18 (*)    All other components within normal limits  CBC - Abnormal; Notable for the following components:   RBC 5.95 (*)    Hemoglobin 17.9 (*)    HCT 53.2 (*)    All other  components within normal limits  LIPASE, BLOOD - Abnormal; Notable for the following components:   Lipase 68 (*)    All other components within normal limits  TROPONIN I (HIGH SENSITIVITY) - Abnormal; Notable for the following components:   Troponin I (High Sensitivity) 19 (*)    All other components within normal limits  TROPONIN I (HIGH SENSITIVITY) - Abnormal; Notable for the following components:   Troponin I (High Sensitivity) 22 (*)    All other components within normal limits  D-DIMER, QUANTITATIVE    EKG EKG Interpretation Date/Time:  Friday June 17 2023 15:46:12 EDT Ventricular Rate:  103 PR Interval:  156 QRS Duration:  88 QT Interval:  352 QTC Calculation: 461 R Axis:   97  Text Interpretation: Sinus  tachycardia Rightward axis Anterior infarct , age undetermined Abnormal ECG When compared with ECG of 01-Feb-2021 10:31, PREVIOUS ECG IS PRESENT Confirmed by Beckey Downing [phone]) on 06/17/2023 3:50:22 PM  Radiology CT ABDOMEN PELVIS W CONTRAST  Result Date: 06/17/2023 CLINICAL DATA:  Pancreatitis, acute, severe EXAM: CT ABDOMEN AND PELVIS WITH CONTRAST TECHNIQUE: Multidetector CT imaging of the abdomen and pelvis was performed using the standard protocol following bolus administration of intravenous contrast. RADIATION DOSE REDUCTION: This exam was performed according to the departmental dose-optimization program which includes automated exposure control, adjustment of the mA and/or kV according to patient size and/or use of iterative reconstruction technique. CONTRAST:  OMNIPAQUE IOHEXOL 300 MG/ML  SOLN COMPARISON:  CT abdomen pelvis 07/04/2020 FINDINGS: Lower chest: No acute abnormality. Hepatobiliary: The hepatic parenchyma is diffusely hypodense compared to the splenic parenchyma consistent with fatty infiltration. 1.2 cm hypodensity likely represents a simple hepatic cyst. Vague hypodensity along the falciform ligament likely fatty infiltration. No gallstones, gallbladder wall thickening, or pericholecystic fluid. No biliary dilatation. Pancreas: No focal lesion. Normal pancreatic contour. No surrounding inflammatory changes. No main pancreatic ductal dilatation. No pseudocyst. Spleen: Normal in size without focal abnormality. Adrenals/Urinary Tract: No adrenal nodule bilaterally. Bilateral kidneys enhance symmetrically. Subcentimeter hypodensities too small to characterize-no further follow-up indicated. No hydronephrosis. No hydroureter. The urinary bladder is unremarkable. Stomach/Bowel: Stomach is within normal limits. No evidence of bowel wall thickening or dilatation. Appendix appears normal. Vascular/Lymphatic: No abdominal aorta or iliac aneurysm. Mild atherosclerotic plaque of the aorta and  its branches. No abdominal, pelvic, or inguinal lymphadenopathy. Reproductive: Prostate is unremarkable. Other: No intraperitoneal free fluid. No intraperitoneal free gas. No organized fluid collection. Musculoskeletal: No abdominal wall hernia or abnormality. No suspicious lytic or blastic osseous lesions. No acute displaced fracture. IMPRESSION: 1. No CT findings of acute pancreatitis. 2. Hepatic steatosis. 3.  Aortic Atherosclerosis (ICD10-I70.0). Electronically Signed   By: Tish Frederickson M.D.   On: 06/17/2023 22:02   DG Chest Port 1 View  Result Date: 06/17/2023 CLINICAL DATA:  Several day history of left-sided chest pain and tachycardia EXAM: PORTABLE CHEST 1 VIEW COMPARISON:  Chest radiograph dated 02/01/2021 FINDINGS: Normal lung volumes. No focal consolidations. No pleural effusion or pneumothorax. The heart size and mediastinal contours are within normal limits. No acute osseous abnormality. IMPRESSION: No active disease. Electronically Signed   By: Agustin Cree M.D.   On: 06/17/2023 16:54    Procedures Procedures    Medications Ordered in ED Medications  ibuprofen (ADVIL) tablet 600 mg (600 mg Oral Given 06/17/23 1538)  famotidine (PEPCID) tablet 20 mg (20 mg Oral Given 06/17/23 1539)  sodium chloride 0.9 % bolus 1,000 mL (0 mLs Intravenous Stopped 06/17/23 2023)  iohexol (OMNIPAQUE) 300 MG/ML solution  100 mL (100 mLs Intravenous Contrast Given 06/17/23 1948)  morphine (PF) 4 MG/ML injection 4 mg (4 mg Intravenous Given 06/17/23 2012)  ondansetron (ZOFRAN) injection 4 mg (4 mg Intravenous Given 06/17/23 2012)  acetaminophen (TYLENOL) tablet 1,000 mg (1,000 mg Oral Given 06/17/23 2256)    ED Course/ Medical Decision Making/ A&P             HEART Score: 3                    Medical Decision Making Amount and/or Complexity of Data Reviewed Labs: ordered. Radiology: ordered.  Risk OTC drugs. Prescription drug management.   Initial Impression and Ddx 47 year old well-appearing male  presenting for chest pain and abdominal pain.  Exam notable for epigastric tenderness but otherwise reassuring.  DDx includes ACS, PE, pneumonia, pancreatitis, acute cholecystitis, other. Patient PMH that increases complexity of ED encounter:  hypertension, tobacco abuse  Interpretation of Diagnostics - I independent reviewed and interpreted the labs as followed: Slightly elevated troponins above baseline but with no significant delta, elevated lipase  - I independently visualized the following imaging with scope of interpretation limited to determining acute life threatening conditions related to emergency care: CT ab/pelvis, which revealed no acute findings  -I personally reviewed interpret EKG which revealed sinus tachycardia  Patient Reassessment and Ultimate Disposition/Management Overall remained clinically well.  On reassessment, patient was completely chest pain-free.  ACS workup was reassuring. However given his intermittent chest pain and slight elevation of cardiac enzymes, felt it would be appropriate for amatory referral to cardiology.  Suspect the epigastric pain, with slightly elevated lipase in setting of recent alcohol drinking could be mild pancreatitis.  CT was reassuring however.  On reassessment he stated that his epigastric pain had resolved.  Discussed return precautions.  Advise follow-up with PCP regarding his epigastric pain.  Vital stable.  Discharged home in good condition.  Patient management required discussion with the following services or consulting groups:  None  Complexity of Problems Addressed Acute complicated illness or Injury  Additional Data Reviewed and Analyzed Further history obtained from: Further history from spouse/family member, Past medical history and medications listed in the EMR, and Prior ED visit notes  Patient Encounter Risk Assessment None         Final Clinical Impression(s) / ED Diagnoses Final diagnoses:  Epigastric pain   Chest pain, unspecified type    Rx / DC Orders ED Discharge Orders          Ordered    Ambulatory referral to Cardiology       Comments: If you have not heard from the Cardiology office within the next 72 hours please call [phone].   06/17/23 2312              Gareth Eagle, PA-C 06/17/23 2312    Gerhard Munch, MD 06/21/23 1905

## 2023-06-17 NOTE — ED Triage Notes (Signed)
[facility] reports pt coming from home c/o panic attack. Sister states pt has a lot going on at home right now. Pt took his inhaler prior to EMS arrival and Fire gave a neb treatment.

## 2023-06-17 NOTE — Discharge Instructions (Addendum)
Evaluation today was overall reassuring.  For your chest pain, recommend you follow-up with cardiology.  For your epigastric pain recommending follow-up PCP.  Suspect could be related to recent drinking of alcohol.  If your epigastric pain or chest pain returns, develop shortness of breath or any other concerning symptom please return emerged part for evaluation.

## 2023-12-18 ENCOUNTER — Emergency Department (HOSPITAL_COMMUNITY)
Admission: EM | Admit: 2023-12-18 | Discharge: 2023-12-18 | Disposition: A | Discharge status: Home / Self Care | Payer: Self-pay | Attending: Emergency Medicine | Admitting: Emergency Medicine

## 2023-12-18 ENCOUNTER — Other Ambulatory Visit: Payer: Self-pay

## 2023-12-18 ENCOUNTER — Encounter (HOSPITAL_COMMUNITY): Payer: Self-pay

## 2023-12-18 ENCOUNTER — Emergency Department (HOSPITAL_COMMUNITY): Payer: Self-pay

## 2023-12-18 DIAGNOSIS — Z7982 Long term (current) use of aspirin: Secondary | ICD-10-CM | POA: Insufficient documentation

## 2023-12-18 DIAGNOSIS — K611 Rectal abscess: Secondary | ICD-10-CM | POA: Insufficient documentation

## 2023-12-18 LAB — CBC WITH DIFFERENTIAL/PLATELET
Abs Immature Granulocytes: 0.04 10*3/uL (ref 0.00–0.07)
Basophils Absolute: 0 10*3/uL (ref 0.0–0.1)
Basophils Relative: 1 %
Eosinophils Absolute: 0 10*3/uL (ref 0.0–0.5)
Eosinophils Relative: 0 %
HCT: 43.8 % (ref 39.0–52.0)
Hemoglobin: 14.6 g/dL (ref 13.0–17.0)
Immature Granulocytes: 1 %
Lymphocytes Relative: 19 %
Lymphs Abs: 1.4 10*3/uL (ref 0.7–4.0)
MCH: 30.7 pg (ref 26.0–34.0)
MCHC: 33.3 g/dL (ref 30.0–36.0)
MCV: 92 fL (ref 80.0–100.0)
Monocytes Absolute: 0.8 10*3/uL (ref 0.1–1.0)
Monocytes Relative: 10 %
Neutro Abs: 5 10*3/uL (ref 1.7–7.7)
Neutrophils Relative %: 69 %
Platelets: 191 10*3/uL (ref 150–400)
RBC: 4.76 MIL/uL (ref 4.22–5.81)
RDW: 13.5 % (ref 11.5–15.5)
WBC: 7.2 10*3/uL (ref 4.0–10.5)
nRBC: 0 % (ref 0.0–0.2)

## 2023-12-18 LAB — BASIC METABOLIC PANEL WITH GFR
Anion gap: 11 (ref 5–15)
BUN: 16 mg/dL (ref 6–20)
CO2: 25 mmol/L (ref 22–32)
Calcium: 9.2 mg/dL (ref 8.9–10.3)
Chloride: 101 mmol/L (ref 98–111)
Creatinine, Ser: 0.83 mg/dL (ref 0.61–1.24)
GFR, Estimated: >60 mL/min (ref >60)
Glucose, Bld: 133 mg/dL — ABNORMAL HIGH (ref 70–99)
Potassium: 3.3 mmol/L — ABNORMAL LOW (ref 3.5–5.1)
Sodium: 137 mmol/L (ref 135–145)

## 2023-12-18 MED ORDER — CIPROFLOXACIN HCL 500 MG PO TABS: 500.0000 mg | ORAL_TABLET | Freq: Two times a day (BID) | ORAL | 0 refills | Status: DC

## 2023-12-18 MED ORDER — OXYCODONE-ACETAMINOPHEN 5-325 MG PO TABS
1.0000 | ORAL_TABLET | Freq: Once | ORAL | Status: AC
  Administered 2023-12-18: 1 via ORAL
  Filled 2023-12-18: qty 1

## 2023-12-18 MED ORDER — KETOROLAC TROMETHAMINE 30 MG/ML IJ SOLN
30.0000 mg | Freq: Once | INTRAMUSCULAR | Status: AC
  Administered 2023-12-18: 30 mg via INTRAVENOUS
  Filled 2023-12-18: qty 1

## 2023-12-18 MED ORDER — LIDOCAINE HCL (PF) 1 % IJ SOLN
2.0000 mL | Freq: Once | INTRAMUSCULAR | Status: AC
  Administered 2023-12-18: 2 mL
  Filled 2023-12-18: qty 30

## 2023-12-18 MED ORDER — OXYCODONE HCL 5 MG PO TABS
5.0000 mg | ORAL_TABLET | Freq: Four times a day (QID) | ORAL | 0 refills | Status: DC | PRN
Start: 2023-12-18 — End: 2023-12-23

## 2023-12-18 MED ORDER — IOHEXOL 300 MG/ML  SOLN
100.0000 mL | Freq: Once | INTRAMUSCULAR | Status: AC | PRN
  Administered 2023-12-18: 100 mL via INTRAVENOUS

## 2023-12-18 MED ORDER — CIPROFLOXACIN IN D5W 400 MG/200ML IV SOLN
400.0000 mg | Freq: Two times a day (BID) | INTRAVENOUS | Status: DC
  Administered 2023-12-18: 400 mg via INTRAVENOUS
  Filled 2023-12-18: qty 200

## 2023-12-18 NOTE — ED Triage Notes (Signed)
 Patient began having rectal pain since Monday. Stated he has had surgery on his rectal abscesses before. Stated it is painful to have bowel movements now because the abscess is in the way. Denies rectal bleeding.

## 2023-12-18 NOTE — ED Provider Notes (Signed)
 [place] EMERGENCY DEPARTMENT AT [facility] Provider Note   CSN: [id number] Arrival date & time: 12/18/23  1009     History  Chief Complaint  Patient presents with   Rectal Pain    Jay Gentry is a 48 y.o. male, hx of perirectal abscesses, who presents to the ED 2/2 to pain in his rectum, has been going on for the last 5 to 6 days.  He states that he has a history of perirectal abscesses, and thinks he has another 1.  Reports that he is having some difficulty stooling now, and that the area feels very painful, and was initially itchy, but has not grown more swollen.  States he is having hard time sitting up, because of the pain.  States the pain is a 7 out of 10.  Is still having bowel movements, but does struggle with it.  Denies any history of constipation, IV drug use, does not receive any anal sex, and is not on any immunosuppressing agents. Denies any fevers or chills.     Home Medications Prior to Admission medications   Medication Sig Start Date End Date Taking? Authorizing Provider  ciprofloxacin (CIPRO) 500 MG tablet Take 1 tablet (500 mg total) by mouth 2 (two) times daily for 5 days. 12/18/23 12/23/23 Yes Abigail Miyamoto, MD  oxyCODONE (OXY IR/ROXICODONE) 5 MG immediate release tablet Take 1 tablet (5 mg total) by mouth every 6 (six) hours as needed for moderate pain (pain score 4-6) or severe pain (pain score 7-10). 12/18/23  Yes Abigail Miyamoto, MD  acetaminophen (TYLENOL) 500 MG tablet Take 2 tablets (1,000 mg total) by mouth every 8 (eight) hours as needed for mild pain or headache. 11/14/18   Juliet Rude, PA-C  albuterol (PROVENTIL) (2.5 MG/3ML) 0.083% nebulizer solution Take 3 mLs (2.5 mg total) by nebulization every 4 (four) hours as needed for wheezing or shortness of breath. 04/14/22   Corwin Levins, MD  albuterol (VENTOLIN HFA) 108 (90 Base) MCG/ACT inhaler INHALE 1 TO 2 PUFFS INTO THE LUNGS EVERY 6 HOURS AS NEEDED 06/06/23   Corwin Levins, MD   amLODipine (NORVASC) 5 MG tablet TAKE 1 TABLET(5 MG) BY MOUTH DAILY 06/10/23   Corwin Levins, MD  aspirin EC 81 MG tablet Take 81-162 mg by mouth every 6 (six) hours as needed for moderate pain. Swallow whole.    [provider]  omeprazole (PRILOSEC) 40 MG capsule Take 1 capsule (40 mg total) by mouth daily. 04/14/22   Corwin Levins, MD      Allergies    Penicillins and Penicillins    Review of Systems   Review of Systems  Constitutional:  Negative for fever.  Skin:  Positive for color change.    Physical Exam Updated Vital Signs BP (!) 148/99   Pulse 60   Temp 98.2 F (36.8 C) (Oral)   Resp 16   Ht 5\' 7"  (1.702 m)   Wt 78.9 kg   SpO2 95%   BMI 27.25 kg/m  Physical Exam Vitals and nursing note reviewed.  Constitutional:      General: He is not in acute distress.    Appearance: He is well-developed.  HENT:     Head: Normocephalic and atraumatic.  Eyes:     Conjunctiva/sclera: Conjunctivae normal.  Cardiovascular:     Rate and Rhythm: Normal rate and regular rhythm.     Heart sounds: No murmur heard. Pulmonary:     Effort: Pulmonary effort is normal. No  respiratory distress.     Breath sounds: Normal breath sounds.  Abdominal:     Palpations: Abdomen is soft.     Tenderness: There is no abdominal tenderness.  Genitourinary:    Comments: Chaperone present. Induration w/slight fluctuance and mild erythema along 11'o'clock of anus. Pt declined internal exam Musculoskeletal:        General: No swelling.     Cervical back: Neck supple.  Skin:    General: Skin is warm and dry.     Capillary Refill: Capillary refill takes less than 2 seconds.  Neurological:     Mental Status: He is alert.  Psychiatric:        Mood and Affect: Mood normal.     ED Results / Procedures / Treatments   Labs (all labs ordered are listed, but only abnormal results are displayed) Labs Reviewed  BASIC METABOLIC PANEL WITH GFR - Abnormal; Notable for the following components:       Result Value   Potassium 3.3 (*)    Glucose, Bld 133 (*)    All other components within normal limits  CBC WITH DIFFERENTIAL/PLATELET    EKG None  Radiology CT PELVIS W CONTRAST Result Date: 12/18/2023 CLINICAL DATA:  Perianal abscess or fistula suspected. Rectal pain. History of rectal abscess. EXAM: CT PELVIS WITH CONTRAST TECHNIQUE: Multidetector CT imaging of the pelvis was performed using the standard protocol following the bolus administration of intravenous contrast. RADIATION DOSE REDUCTION: This exam was performed according to the departmental dose-optimization program which includes automated exposure control, adjustment of the mA and/or kV according to patient size and/or use of iterative reconstruction technique. CONTRAST:  OMNIPAQUE IOHEXOL 300 MG/ML  SOLN COMPARISON:  06/17/2023 FINDINGS: Urinary Tract:  No abnormality visualized. Bowel: Unremarkable visualized pelvic bowel loops. Normal appendix. Vascular/Lymphatic: No aneurysm or adenopathy. Scattered calcifications. Reproductive:  No visible focal abnormality. Other: No free fluid or free air. There is a Leara Rawl fluid collection noted in the left perineal soft tissues noted along the left posterior rectal wall measuring 1.8 x 1.7 cm compatible with Holman Bonsignore perirectal abscess. Musculoskeletal: No acute bony abnormality. IMPRESSION: 1.8 x 1.7 cm fluid collection with rim enhancement noted along the left posterior rectum compatible with perirectal abscess. Scattered aortoiliac atherosclerosis. Electronically Signed   By: Charlett Nose M.D.   On: 12/18/2023 11:59    Procedures Procedures    Medications Ordered in ED Medications  lidocaine (PF) (XYLOCAINE) 1 % injection 2 mL (has no administration in time range)  ketorolac (TORADOL) 30 MG/ML injection 30 mg (has no administration in time range)  ciprofloxacin (CIPRO) IVPB 400 mg (has no administration in time range)  oxyCODONE-acetaminophen (PERCOCET/ROXICET) 5-325 MG per tablet  1 tablet (1 tablet Oral Given 12/18/23 1130)  iohexol (OMNIPAQUE) 300 MG/ML solution 100 mL (100 mLs Intravenous Contrast Given 12/18/23 1146)    ED Course/ Medical Decision Making/ A&P                                 Medical Decision Making Patient is a 49 year old male, here for a rectal pain, has been going on for the last week.  States he is having hard time sitting on it because the pain is so painful.  On exam, he has an area of induration, but no gross fluctuance.  I will obtain a CT pelvis, given history of perianal/perirectal abscess, with fistula.  He has no overt erythema of the area, and it  does not appear gangrenous.  Percocet for pain control  Amount and/or Complexity of Data Reviewed Labs: ordered.    Details: Unremarkable labs Radiology: ordered.    Details: CT pelvis, shows perirectal abscess 1.8 x 1.7 cm Discussion of management or test interpretation with external provider(s): Discussed with Dr. Criss Alvine, he recommends surgical eval, given his perirectal abscess, may not be best drain, at bedside.  Reach out to Dr. Magnus Ivan, he will drain it at bedside, and he prescribed antibiotics including ciprofloxacin, and pain medication for him.  Follow-up with general surgery.  Discussed return precautions discharged home  Risk Prescription drug management.    Final Clinical Impression(s) / ED Diagnoses Final diagnoses:  Perirectal abscess    Rx / DC Orders ED Discharge Orders          Ordered    ciprofloxacin (CIPRO) 500 MG tablet  2 times daily        12/18/23 1334    oxyCODONE (OXY IR/ROXICODONE) 5 MG immediate release tablet  Every 6 hours PRN        12/18/23 1334              Lupe Handley Elbert Ewings, PA 12/18/23 1344    Pricilla Loveless, MD 12/18/23 1444

## 2023-12-18 NOTE — Consult Note (Signed)
 Reason for Consult:perianal abscess Referring Physician: Dr Rica Records is an 48 y.o. male.  HPI: This is a 48 year old gentleman who presents with several day history of perianal pain.  He had a very large almost 8 cm perirectal abscess drained in 2016 and another 1 drained in 2018.  He was having increasing discomfort so was presented to the emergency department.  He underwent a CT scan of the pelvis showing a 1.8 cm perianal abscess.  He has no history of chronic disease and has had no indications that there than a fistula.  He has had no issues since his last surgery in 2018  Past Medical History:  Diagnosis Date   Acute upper GI bleeding 07/04/2020   Asthma    Bronchitis    Hypertension    Perirectal abscess 05/14/2015    Past Surgical History:  Procedure Laterality Date   ESOPHAGOGASTRODUODENOSCOPY N/A 07/05/2020   Procedure: ESOPHAGOGASTRODUODENOSCOPY (EGD);  Surgeon: Sherrilyn Rist, MD;  Location: [facility];  Service: Gastroenterology;  Laterality: N/A;   INCISION AND DRAINAGE PERIRECTAL ABSCESS  05/14/2015   INCISION AND DRAINAGE PERIRECTAL ABSCESS N/A 05/14/2015   Procedure: DRAINAGE PERIRECTAL ABSCESS;  Surgeon: Manus Rudd, MD;  Location: [facility];  Service: General;  Laterality: N/A;   INCISION AND DRAINAGE PERIRECTAL ABSCESS N/A 11/09/2016   Procedure: IRRIGATION AND DEBRIDEMENT PERIRECTAL ABSCESS;  Surgeon: Glenna Fellows, MD;  Location: [facility];  Service: General;  Laterality: N/A;    Family History  Problem Relation Age of Onset   Asthma Mother     Social History:  reports that he has been smoking cigarettes. He has a 13.5 pack-year smoking history. He has never used smokeless tobacco. He reports current alcohol use. He reports that he does not currently use drugs.  Allergies:  Allergies  Allergen Reactions   Penicillins Hives    Has patient had a PCN reaction causing immediate rash, facial/tongue/throat swelling, SOB or lightheadedness with  hypotension: yes Has patient had a PCN reaction causing severe rash involving mucus membranes or skin necrosis: no Has patient had a PCN reaction that required hospitalization no Has patient had a PCN reaction occurring within the last 10 years: no If all of the above answers are "NO", then may proceed with Cephalosporin use.    Penicillins Hives    Did it involve swelling of the face/tongue/throat, SOB, or low BP? No Did it involve sudden or severe rash/hives, skin peeling, or any reaction on the inside of your mouth or nose?  Did you need to seek medical attention at a hospital or doctor's office? Unknown When did it last happen?      childhood If all above answers are "NO", may proceed with cephalosporin use.    Medications: I have reviewed the patient's current medications.  Results for orders placed or performed during the hospital encounter of 12/18/23 (from the past 48 hours)  CBC with Differential     Status: None   Collection Time: 12/18/23 10:51 AM  Result Value Ref Range   WBC 7.2 4.0 - 10.5 K/uL   RBC 4.76 4.22 - 5.81 MIL/uL   Hemoglobin 14.6 13.0 - 17.0 g/dL   HCT 16.1 09.6 - 04.5 %   MCV 92.0 80.0 - 100.0 fL   MCH 30.7 26.0 - 34.0 pg   MCHC 33.3 30.0 - 36.0 g/dL   RDW 40.9 81.1 - 91.4 %   Platelets 191 150 - 400 K/uL   nRBC 0.0 0.0 - 0.2 %  Neutrophils Relative % 69 %   Neutro Abs 5.0 1.7 - 7.7 K/uL   Lymphocytes Relative 19 %   Lymphs Abs 1.4 0.7 - 4.0 K/uL   Monocytes Relative 10 %   Monocytes Absolute 0.8 0.1 - 1.0 K/uL   Eosinophils Relative 0 %   Eosinophils Absolute 0.0 0.0 - 0.5 K/uL   Basophils Relative 1 %   Basophils Absolute 0.0 0.0 - 0.1 K/uL   Immature Granulocytes 1 %   Abs Immature Granulocytes 0.04 0.00 - 0.07 K/uL    Comment: Performed at [facility], [address] [place], [place] [address]  Basic metabolic panel     Status: Abnormal   Collection Time: 12/18/23 10:51 AM  Result Value Ref Range   Sodium 137 135 - 145  mmol/L   Potassium 3.3 (L) 3.5 - 5.1 mmol/L   Chloride 101 98 - 111 mmol/L   CO2 25 22 - 32 mmol/L   Glucose, Bld 133 (H) 70 - 99 mg/dL    Comment: Glucose reference range applies only to samples taken after fasting for at least 8 hours.   BUN 16 6 - 20 mg/dL   Creatinine, Ser 2.13 0.61 - 1.24 mg/dL   Calcium 9.2 8.9 - 08.6 mg/dL   GFR, Estimated >57 >84 mL/min    Comment: (NOTE) Calculated using the CKD-EPI Creatinine Equation (2021)    Anion gap 11 5 - 15    Comment: Performed at [facility], [address] [place], [place] [address]    CT PELVIS W CONTRAST Result Date: 12/18/2023 CLINICAL DATA:  Perianal abscess or fistula suspected. Rectal pain. History of rectal abscess. EXAM: CT PELVIS WITH CONTRAST TECHNIQUE: Multidetector CT imaging of the pelvis was performed using the standard protocol following the bolus administration of intravenous contrast. RADIATION DOSE REDUCTION: This exam was performed according to the departmental dose-optimization program which includes automated exposure control, adjustment of the mA and/or kV according to patient size and/or use of iterative reconstruction technique. CONTRAST:  OMNIPAQUE IOHEXOL 300 MG/ML  SOLN COMPARISON:  06/17/2023 FINDINGS: Urinary Tract:  No abnormality visualized. Bowel: Unremarkable visualized pelvic bowel loops. Normal appendix. Vascular/Lymphatic: No aneurysm or adenopathy. Scattered calcifications. Reproductive:  No visible focal abnormality. Other: No free fluid or free air. There is a small fluid collection noted in the left perineal soft tissues noted along the left posterior rectal wall measuring 1.8 x 1.7 cm compatible with small perirectal abscess. Musculoskeletal: No acute bony abnormality. IMPRESSION: 1.8 x 1.7 cm fluid collection with rim enhancement noted along the left posterior rectum compatible with perirectal abscess. Scattered aortoiliac atherosclerosis. Electronically Signed   By: Charlett Nose M.D.   On: 12/18/2023 11:59    Review of Systems  All other systems reviewed and are negative.  Blood pressure (!) 148/99, pulse 60, temperature 98.2 F (36.8 C), temperature source Oral, resp. rate 16, height 5\' 7"  (1.702 m), weight 78.9 kg, SpO2 95%. Physical Exam Constitutional:      General: He is not in acute distress.    Appearance: Normal appearance.  Genitourinary:    Comments: He has well-healed scars on his left perianal area.  Just superior to this is the area of palpable concern.  There is a palpable abscess. Neurological:     Mental Status: He is alert.     Assessment/Plan: Perianal abscess  This is a small superficial abscess.  We discussed just aspirating it in the emergency department and outpatient antibiotics versus surgical drainage  in the operating room.  This is very superficial and quite small so I believe aspiration would be best.  He has also been drinking water and surgery would not likely be until this evening versus tomorrow.  He is in agreement with attempting aspiration..  We discussed the risks which includes recurrent abscess.  He understands and agrees to proceed   Procedure note: After obtaining signed consent and marking the left perianal area, the area of the abscess was prepped with alcohol.  I then anesthetized the area with 1% lidocaine.  I then used an 18-gauge needle to easily access the abscess cavity and drained 6 cc of purulence.  This caused a palpable resolution of the area.  He reports feeling much better after the drainage of the abscess   He tolerated the aspiration well.  He will be discharged home from the emergency department after we give him IV Toradol and 1 dose of IV Cipro as he cannot get his antibiotics until tomorrow.          Abigail Miyamoto 12/18/2023, 1:34 PM

## 2023-12-18 NOTE — Discharge Instructions (Signed)
 You may use exercising Tylenol and ibuprofen also for pain  Sitting in a warm tub or sitz bath may also help with discomfort  Should you have any issues or questions, please call our office at [phone].      To whom it may concern:  Please excuse Mr. Jay Gentry absence from work today on 12/18/2023 secondary to the need for an emergency room visit and procedure.  He may resume work starting tomorrow 12/19/2023 without limitations  Thank you:  Abigail Miyamoto, MD [facility]

## 2023-12-23 ENCOUNTER — Other Ambulatory Visit: Payer: Self-pay

## 2023-12-23 ENCOUNTER — Encounter (HOSPITAL_COMMUNITY): Payer: Self-pay | Admitting: Emergency Medicine

## 2023-12-23 ENCOUNTER — Emergency Department (HOSPITAL_COMMUNITY)
Admission: EM | Admit: 2023-12-23 | Discharge: 2023-12-23 | Disposition: A | Discharge status: Home / Self Care | Payer: Self-pay | Attending: Emergency Medicine | Admitting: Emergency Medicine

## 2023-12-23 DIAGNOSIS — Z7982 Long term (current) use of aspirin: Secondary | ICD-10-CM | POA: Insufficient documentation

## 2023-12-23 DIAGNOSIS — J45909 Unspecified asthma, uncomplicated: Secondary | ICD-10-CM | POA: Insufficient documentation

## 2023-12-23 DIAGNOSIS — Z720 Tobacco use: Secondary | ICD-10-CM | POA: Diagnosis present

## 2023-12-23 DIAGNOSIS — Z79899 Other long term (current) drug therapy: Secondary | ICD-10-CM | POA: Insufficient documentation

## 2023-12-23 DIAGNOSIS — I10 Essential (primary) hypertension: Secondary | ICD-10-CM | POA: Insufficient documentation

## 2023-12-23 DIAGNOSIS — K611 Rectal abscess: Secondary | ICD-10-CM | POA: Diagnosis present

## 2023-12-23 MED ORDER — KETOROLAC TROMETHAMINE 15 MG/ML IJ SOLN
15.0000 mg | Freq: Once | INTRAMUSCULAR | Status: AC
  Administered 2023-12-23: 15 mg via INTRAVENOUS

## 2023-12-23 MED ORDER — SODIUM CHLORIDE 0.9 % IV SOLN: 250.0000 mL | INTRAVENOUS | Status: DC | PRN

## 2023-12-23 MED ORDER — NAPHAZOLINE-GLYCERIN 0.012-0.25 % OP SOLN: 1.0000 [drp] | Freq: Four times a day (QID) | OPHTHALMIC | Status: DC | PRN

## 2023-12-23 MED ORDER — ACETAMINOPHEN 500 MG PO TABS: 1000.0000 mg | ORAL_TABLET | Freq: Four times a day (QID) | ORAL | Status: DC

## 2023-12-23 MED ORDER — METRONIDAZOLE 500 MG PO TABS: 500.0000 mg | ORAL_TABLET | Freq: Two times a day (BID) | ORAL | 0 refills | Status: DC

## 2023-12-23 MED ORDER — SODIUM CHLORIDE 0.9 % IV SOLN
2.0000 g | Freq: Three times a day (TID) | INTRAVENOUS | Status: DC
Start: 2023-12-23 — End: 2023-12-24
  Administered 2023-12-23: 2 g via INTRAVENOUS
  Filled 2023-12-23: qty 12.5

## 2023-12-23 MED ORDER — HYDROMORPHONE HCL 1 MG/ML IJ SOLN: 0.5000 mg | INTRAMUSCULAR | Status: DC | PRN

## 2023-12-23 MED ORDER — METHOCARBAMOL 1000 MG/10ML IJ SOLN: 1000.0000 mg | Freq: Four times a day (QID) | INTRAMUSCULAR | Status: DC | PRN

## 2023-12-23 MED ORDER — METRONIDAZOLE 500 MG/100ML IV SOLN
500.0000 mg | Freq: Two times a day (BID) | INTRAVENOUS | Status: DC
  Administered 2023-12-23: 500 mg via INTRAVENOUS
  Filled 2023-12-23: qty 100

## 2023-12-23 MED ORDER — SALINE SPRAY 0.65 % NA SOLN: 1.0000 | Freq: Four times a day (QID) | NASAL | Status: DC | PRN

## 2023-12-23 MED ORDER — OXYCODONE HCL 5 MG PO TABS
5.0000 mg | ORAL_TABLET | ORAL | Status: DC | PRN
Start: 2023-12-23 — End: 2023-12-23

## 2023-12-23 MED ORDER — LACTATED RINGERS IV BOLUS: 1000.0000 mL | Freq: Three times a day (TID) | INTRAVENOUS | Status: DC | PRN

## 2023-12-23 MED ORDER — LIDOCAINE HCL (PF) 1 % IJ SOLN
5.0000 mL | Freq: Once | INTRAMUSCULAR | Status: DC
  Filled 2023-12-23: qty 30

## 2023-12-23 MED ORDER — SODIUM CHLORIDE 0.9% FLUSH: 3.0000 mL | INTRAVENOUS | Status: DC | PRN

## 2023-12-23 MED ORDER — KETOROLAC TROMETHAMINE 15 MG/ML IJ SOLN
INTRAMUSCULAR | Status: AC
  Administered 2023-12-23: 15 mg via INTRAVENOUS
  Filled 2023-12-23: qty 1

## 2023-12-23 MED ORDER — SODIUM CHLORIDE 0.9% FLUSH: 3.0000 mL | Freq: Two times a day (BID) | INTRAVENOUS | Status: DC

## 2023-12-23 MED ORDER — CIPROFLOXACIN HCL 500 MG PO TABS: 500.0000 mg | ORAL_TABLET | Freq: Two times a day (BID) | ORAL | 0 refills | Status: AC

## 2023-12-23 MED ORDER — OXYCODONE HCL 5 MG PO TABS: 5.0000 mg | ORAL_TABLET | Freq: Four times a day (QID) | ORAL | 0 refills | Status: DC | PRN

## 2023-12-23 MED ORDER — DIAZEPAM 5 MG PO TABS: 5.0000 mg | ORAL_TABLET | Freq: Four times a day (QID) | ORAL | Status: DC | PRN

## 2023-12-23 NOTE — Discharge Instructions (Addendum)
 Please call the general surgeon whose contact information I provided above for close follow-up on Monday or Tuesday next week.  Anal Abscess/Fistula  What is an anal abscess? An anal abscess is an infected cavity filled with pus found near the anus or rectum.  What is an anal fistula? An anal fistula (also called fistula-in-ano) is frequently the result of a previous or current anal abscess, occurring in up to 50% of patients with abscesses. Normal anatomy includes small glands just inside the anus. Occasionally, these glands get clogged and potentially can become infected, leading to an abscess. The fistula is a tunnel that forms under the skin and connects the infected glands to the abscess. A fistula can be present with or without an abscess and may connect just to the skin of the buttocks near the anal opening. Other situations that can result in a fistula include Crohn's disease, radiation, trauma and malignancy.  How does someone get an anal abscess or a fistula? The abscess is most often a result of an acute infection in the internal glands of the anus. Occasionally, bacteria, fecal material or foreign matter can clog the anal gland and create a condition for an abscess cavity to form. Other medical conditions can make these types of infections more likely.  After an abscess drains on its own or has been drained (opened), a tunnel (fistula) may persist, connecting the infected anal gland to the external skin. This typically will involve some type of drainage from the external opening and occurs in up to 50% of abscesses. If the opening on the skin heals when a fistula is present, a recurrent abscess may develop.  What are the specific signs or symptoms of an abscess or fistula? A patient with an abscess may have pain, redness or swelling in the area around the anal area. Fatigue, general malaise, as well as accompanying fever or chills are also common. Similar signs and symptoms may be present  when patients have a fistula, with the addition of possible irritation of the perianal skin or drainage from an external opening.  Is any specific testing necessary to diagnose an abscess or fistula? No. Most anal abscesses or fistula-in-ano are diagnosed and managed on the basis of clinical findings. Occasionally, additional studies such as ultrasound, CT scan, or MRI can assist with the diagnosis of deeper abscesses or the delineation of the fistula tunnel to help guide treatment.   What is the treatment of an anal abscess? The treatment of an abscess is surgical drainage under most circumstances. An incision is made in the skin near the anus to drain the infection. This can be done in a doctor's office with local anesthetic or in an operating room under deeper anesthesia. Hospitalization may be required for patients prone to more significant infections such as diabetics or patients with decreased immunity.  Are antibiotics required to treat this type of infection? Antibiotics alone are a poor alternative to drainage of the infection. For uncomplicated abscesses, the addition of antibiotics to surgical drainage does not improve healing time or reduce the potential for recurrences. There are some conditions in which antibiotics are indicated, such as for patients with compromised or altered immunity, some cardiac valvular conditions or extensive cellulitis. A comprehensive discussion of your past medical history and a physical exam are important to determine if antibiotics are indicated.  What is the treatment of an anal fistula? Surgery is almost always necessary to cure an anal fistula. Although surgery can be fairly straightforward, it may  also be complicated, occasionally requiring staged or multiple operations. Consider identifying a specialist in colon and rectal surgery who would be familiar with a number of potential operations to treat the fistula. The surgery may be performed at the same time  as drainage of an abscess, although sometimes the fistula doesn't appear until weeks to years after the initial drainage. If the fistula is straightforward, a fistulotomy may be performed. This procedure involves connecting the internal opening within the anal canal to the external opening, creating a groove that will heal from the inside out. This surgery often will require dividing a small portion of the sphincter muscle which has the unlikely potential for affecting the control of bowel movements in a limited number of cases.  Other procedures include placing material within the fistula tract to occlude it or surgically altering the surrounding tissue to accomplish closure of the fistula, with the choice of procedure depending upon the type, length, and location of the fistula. Most of the operations can be performed on an outpatient basis, but may occasionally require hospitalization.  What is the recovery like from surgery? Pain after surgery is controlled with pain pills, fiber and bulk laxatives. Patients should plan for time at home using sitz baths and attempt to avoid the constipation that can be associated with prescription pain medication. Discuss with your surgeon the specific care and time away from work prior to surgery to prepare yourself for post-operative care.  Can the abscess or fistula recur? If adequately treated and properly healed, both are unlikely to return. However, despite proper and indicated open or minimally invasive treatment, both abscesses and fistulas can potentially recur. Should similar symptoms arise, suggesting recurrence, it is recommended that you find a colon and rectal surgeon to manage your condition.  What is a colon and rectal surgeon? Colon and rectal surgeons are experts in the surgical and non-surgical treatment of diseases of the colon, rectum and anus. They have completed advanced surgical training in the treatment of these diseases as well as full general  surgical training. Board-certified colon and rectal surgeons complete residencies in general surgery and colon and rectal surgery, and pass intensive examinations conducted by the American Board of Surgery and the American Board of Colon and Rectal Surgery. They are well-versed in the treatment of both benign and malignant diseases of the colon, rectum and anus and are able to perform routine screening examinations and surgically treat conditions if indicated to do so.     ################################   STOP SMOKING!  We strongly recommend that you stop smoking.  Smoking increases the risk of surgery including infection in the form of an open wound, pus formation, abscess, hernia at an incision on the abdomen, etc.  You have an increased risk of other MAJOR complications such as stroke, heart attack, forming clots in the leg and/or lungs, and death.    Smoking Cessation Quitting smoking is important to your health and has many advantages. However, it is not always easy to quit since nicotine is a very addictive drug. Often times, people try 3 times or more before being able to quit. This document explains the best ways for you to prepare to quit smoking. Quitting takes hard work and a lot of effort, but you can do it. ADVANTAGES OF QUITTING SMOKING You will live longer, feel better, and live better. Your body will feel the impact of quitting smoking almost immediately. Within 20 minutes, blood pressure decreases. Your pulse returns to its normal level. After 8  hours, carbon monoxide levels in the blood return to normal. Your oxygen level increases. After 24 hours, the chance of having a heart attack starts to decrease. Your breath, hair, and body stop smelling like smoke. After 48 hours, damaged nerve endings begin to recover. Your sense of taste and smell improve. After 72 hours, the body is virtually free of nicotine. Your bronchial tubes relax and breathing becomes easier. After 2 to 12  weeks, lungs can hold more air. Exercise becomes easier and circulation improves. The risk of having a heart attack, stroke, cancer, or lung disease is greatly reduced. After 1 year, the risk of coronary heart disease is cut in half. After 5 years, the risk of stroke falls to the same as a nonsmoker. After 10 years, the risk of lung cancer is cut in half and the risk of other cancers decreases significantly. After 15 years, the risk of coronary heart disease drops, usually to the level of a nonsmoker. If you are pregnant, quitting smoking will improve your chances of having a healthy baby. The people you live with, especially any children, will be healthier. You will have extra money to spend on things other than cigarettes. QUESTIONS TO THINK ABOUT BEFORE ATTEMPTING TO QUIT You may want to talk about your answers with your caregiver. Why do you want to quit? If you tried to quit in the past, what helped and what did not? What will be the most difficult situations for you after you quit? How will you plan to handle them? Who can help you through the tough times? Your family? Friends? A caregiver? What pleasures do you get from smoking? What ways can you still get pleasure if you quit? Here are some questions to ask your caregiver: How can you help me to be successful at quitting? What medicine do you think would be best for me and how should I take it? What should I do if I need more help? What is smoking withdrawal like? How can I get information on withdrawal? GET READY Set a quit date. Change your environment by getting rid of all cigarettes, ashtrays, matches, and lighters in your home, car, or work. Do not let people smoke in your home. Review your past attempts to quit. Think about what worked and what did not. GET SUPPORT AND ENCOURAGEMENT You have a better chance of being successful if you have help. You can get support in many ways. Tell your family, friends, and co-workers that  you are going to quit and need their support. Ask them not to smoke around you. Get individual, group, or telephone counseling and support. Programs are available at [employer] and health centers. Call your local health department for information about programs in your area. Spiritual beliefs and practices may help some smokers quit. Download a "quit meter" on your computer to keep track of quit statistics, such as how long you have gone without smoking, cigarettes not smoked, and money saved. Get a self-help book about quitting smoking and staying off of tobacco. LEARN NEW SKILLS AND BEHAVIORS Distract yourself from urges to smoke. Talk to someone, go for a walk, or occupy your time with a task. Change your normal routine. Take a different route to work. Drink tea instead of coffee. Eat breakfast in a different place. Reduce your stress. Take a hot bath, exercise, or read a book. Plan something enjoyable to do every day. Reward yourself for not smoking. Explore interactive web-based programs that specialize in helping you quit. GET  MEDICINE AND USE IT CORRECTLY Medicines can help you stop smoking and decrease the urge to smoke. Combining medicine with the above behavioral methods and support can greatly increase your chances of successfully quitting smoking. Nicotine replacement therapy helps deliver nicotine to your body without the negative effects and risks of smoking. Nicotine replacement therapy includes nicotine gum, lozenges, inhalers, nasal sprays, and skin patches. Some may be available over-the-counter and others require a prescription. Antidepressant medicine helps people abstain from smoking, but how this works is unknown. This medicine is available by prescription. Nicotinic receptor partial agonist medicine simulates the effect of nicotine in your brain. This medicine is available by prescription. Ask your caregiver for advice about which medicines to use and how to use them based  on your health history. Your caregiver will tell you what side effects to look out for if you choose to be on a medicine or therapy. Carefully read the information on the package. Do not use any other product containing nicotine while using a nicotine replacement product.  RELAPSE OR DIFFICULT SITUATIONS Most relapses occur within the first 3 months after quitting. Do not be discouraged if you start smoking again. Remember, most people try several times before finally quitting. You may have symptoms of withdrawal because your body is used to nicotine. You may crave cigarettes, be irritable, feel very hungry, cough often, get headaches, or have difficulty concentrating. The withdrawal symptoms are only temporary. They are strongest when you first quit, but they will go away within 10 14 days. To reduce the chances of relapse, try to: Avoid drinking alcohol. Drinking lowers your chances of successfully quitting. Reduce the amount of caffeine you consume. Once you quit smoking, the amount of caffeine in your body increases and can give you symptoms, such as a rapid heartbeat, sweating, and anxiety. Avoid smokers because they can make you want to smoke. Do not let weight gain distract you. Many smokers will gain weight when they quit, usually less than 10 pounds. Eat a healthy diet and stay active. You can always lose the weight gained after you quit. Find ways to improve your mood other than smoking. FOR MORE INFORMATION  www.smokefree.gov    While it can be one of the most difficult things to do, the [place] community has programs to help you stop.  Consider talking with your primary care physician about options.  Also, Smoking Cessation classes are available through the [facility]:  The smoking cessation program is a proven-effective program from the American Lung Association. The program is available for anyone 60 and older who currently smokes. The program lasts for 7 weeks and is 8 sessions. Each  class will be approximately 1 1/2 hours. The program is every Tuesday.  All classes are 12-1:30pm and same location.  Event Location Information:  Location: [facility] 2nd Floor Conference Room 2-037; located next to [facility] cross streets: [address] & [facility] Entrance into the [facility] is adjacent to the [facility] main entrance. The conference room is located on the 2nd floor.  Parking Instructions: Visitor parking is adjacent to [employer] main entrance and the Cancer Center   A smoking cessation program is also offered through the [facility]. Register online at [email or web address] or call [phone] for more information.  Tobacco cessation counseling is available at [facility]. Call [phone] for a free appointment.  Tobacco cessation classes also are available through the [facility]  [facility] in [place]. For information, call [phone].  The Patient Education Network features videos on tobacco cessation. Please consult your listings in the center of this book to find instructions on how to access this resource.  If you want more information, ask your nurse.

## 2023-12-23 NOTE — ED Triage Notes (Signed)
 Patient report rectal pain x 3 days. Patient stated he felt his rectal abscess is back again. Patient had I &D done 5 days ago. Patient denies N/V. Patient denies fever.

## 2023-12-23 NOTE — ED Provider Notes (Signed)
 [place] EMERGENCY DEPARTMENT AT [facility] Provider Note   CSN: [id number] Arrival date & time: 12/23/23  1926     History  Chief Complaint  Patient presents with   Rectal Pain    Jay Gentry is a 48 y.o. male with past medical history seen for hypertension, asthma, perirectal abscess requiring incision and drainage by general surgery who presents concern for return of rectal pain for the last 3 days.  He had a rectal abscess identified at the ED 5 days ago and I&D performed by surgery at that time.  He denies nausea, vomiting, fever, chills, reports the pain was controlled with oxycodone  but now is worsening again.  He feels like the swelling is returned.  HPI     Home Medications Prior to Admission medications   Medication Sig Start Date End Date Taking? Authorizing Provider  metroNIDAZOLE  (FLAGYL ) 500 MG tablet Take 1 tablet (500 mg total) by mouth 2 (two) times daily for 10 days. 12/23/23 01/02/24 Yes Candyce Champagne, MD  acetaminophen  (TYLENOL ) 500 MG tablet Take 2 tablets (1,000 mg total) by mouth every 8 (eight) hours as needed for mild pain or headache. 11/14/18   Annetta Killian, PA-C  albuterol  (PROVENTIL ) (2.5 MG/3ML) 0.083% nebulizer solution Take 3 mLs (2.5 mg total) by nebulization every 4 (four) hours as needed for wheezing or shortness of breath. 04/14/22   Roslyn Coombe, MD  albuterol  (VENTOLIN  HFA) 108 [phone] Base) MCG/ACT inhaler INHALE 1 TO 2 PUFFS INTO THE LUNGS EVERY 6 HOURS AS NEEDED 06/06/23   Roslyn Coombe, MD  amLODipine  (NORVASC ) 5 MG tablet TAKE 1 TABLET(5 MG) BY MOUTH DAILY 06/10/23   Roslyn Coombe, MD  aspirin EC 81 MG tablet Take 81-162 mg by mouth every 6 (six) hours as needed for moderate pain. Swallow whole.    [provider]  ciprofloxacin  (CIPRO ) 500 MG tablet Take 1 tablet (500 mg total) by mouth 2 (two) times daily for 10 days. 12/23/23 01/02/24  Candyce Champagne, MD  omeprazole  (PRILOSEC) 40 MG capsule Take 1 capsule (40 mg total) by  mouth daily. 04/14/22   Roslyn Coombe, MD  oxyCODONE  (OXY IR/ROXICODONE ) 5 MG immediate release tablet Take 1 tablet (5 mg total) by mouth every 6 (six) hours as needed for moderate pain (pain score 4-6) or severe pain (pain score 7-10). 12/23/23   Charlean Carneal H, PA-C      Allergies    Penicillins and Penicillins    Review of Systems   Review of Systems  All other systems reviewed and are negative.   Physical Exam Updated Vital Signs BP (!) 136/112 (BP Location: Left Arm)   Pulse 96   Temp 98.3 F (36.8 C) (Oral)   Resp 18   SpO2 98%  Physical Exam Vitals and nursing note reviewed.  Constitutional:      General: He is not in acute distress.    Appearance: Normal appearance.  HENT:     Head: Normocephalic and atraumatic.  Eyes:     General:        Right eye: No discharge.        Left eye: No discharge.  Cardiovascular:     Rate and Rhythm: Normal rate and regular rhythm.     Heart sounds: No murmur heard.    No friction rub. No gallop.  Pulmonary:     Effort: Pulmonary effort is normal.     Breath sounds: Normal breath sounds.  Abdominal:  General: Bowel sounds are normal.     Palpations: Abdomen is soft.  Skin:    General: Skin is warm and dry.     Capillary Refill: Capillary refill takes less than 2 seconds.     Comments: Indurated and firm tissue lateral to the gluteal cleft with surrounding scar tissue. No significant fluctuance. I and D with scant pus drainage from area. Tender, firm area around 4mm wide.  Neurological:     Mental Status: He is alert and oriented to person, place, and time.  Psychiatric:        Mood and Affect: Mood normal.        Behavior: Behavior normal.     ED Results / Procedures / Treatments   Labs (all labs ordered are listed, but only abnormal results are displayed) Labs Reviewed - No data to display  EKG None  Radiology No results found.  Procedures .Incision and Drainage  Date/Time: 12/23/2023 8:48  PM  Performed by: Nelly Banco, PA-C Authorized by: Nelly Banco, PA-C   Consent:    Consent obtained:  Verbal   Consent given by:  Patient   Risks, benefits, and alternatives were discussed: yes     Risks discussed:  Bleeding   Alternatives discussed:  No treatment Universal protocol:    Procedure explained and questions answered to patient or proxy's satisfaction: yes     Patient identity confirmed:  Verbally with patient Location:    Type:  Abscess   Size:  4mm   Location:  Anogenital   Anogenital location:  Gluteal cleft Pre-procedure details:    Skin preparation:  Povidone-iodine Sedation:    Sedation type:  None Anesthesia:    Anesthesia method:  Local infiltration   Local anesthetic:  Lidocaine  1% w/o epi Procedure type:    Complexity:  Simple Procedure details:    Incision types:  Single straight   Wound management:  Probed and deloculated   Drainage:  Bloody and purulent   Drainage amount:  Scant   Wound treatment:  Wound left open   Packing materials:  None Post-procedure details:    Procedure completion:  Tolerated     Medications Ordered in ED Medications  metroNIDAZOLE  (FLAGYL ) IVPB 500 mg (has no administration in time range)  ceFEPIme  (MAXIPIME ) 2 g in sodium chloride  0.9 % 100 mL IVPB (has no administration in time range)  acetaminophen  (TYLENOL ) tablet 1,000 mg (has no administration in time range)  ketorolac  (TORADOL ) 15 MG/ML injection 15 mg (15 mg Intravenous Given 12/23/23 2051)    ED Course/ Medical Decision Making/ A&P                                 Medical Decision Making Risk Prescription drug management.   This patient is a 48 y.o. male  who presents to the ED for concern of recurrent perirectal abscess.   Differential diagnoses prior to evaluation: The emergent differential diagnosis includes, but is not limited to,  recurrent abscess, cellulitis, developing sepsis, bacteremia, vs ongoing pain alone . This is not  an exhaustive differential.   Past Medical History / Co-morbidities / Social History: Asthma, bronchitis, hypertension, recurrent anal abscesses  Additional history: Chart reviewed. Pertinent results include: Extensively reviewed lab work, imaging from his recent previous emergency department visit  Physical Exam: Physical exam performed. The pertinent findings include:  Indurated and firm tissue lateral to the gluteal cleft with surrounding scar tissue. No significant  fluctuance. I and D with scant pus drainage from area. Tender, firm area around 4mm wide.    Medications: I ordered medication including 1 dose of IV antibiotics given along with Toradol  for pain, will plan to discharge with Cipro  Flagyl  and close follow-up with surgery..  I have reviewed the patients home medicines and have made adjustments as needed.   Disposition: After consideration of the diagnostic results and the patients response to treatment, I feel that patient stable for discharge with surgery follow-up.   emergency department workup does not suggest an emergent condition requiring admission or immediate intervention beyond what has been performed at this time. The plan is: as above. The patient is safe for discharge and has been instructed to return immediately for worsening symptoms, change in symptoms or any other concerns.  Final Clinical Impression(s) / ED Diagnoses Final diagnoses:  Rectal abscess    Rx / DC Orders ED Discharge Orders          Ordered    ciprofloxacin  (CIPRO ) 500 MG tablet  2 times daily        12/23/23 2012    metroNIDAZOLE  (FLAGYL ) 500 MG tablet  2 times daily        12/23/23 2012    oxyCODONE  (OXY IR/ROXICODONE ) 5 MG immediate release tablet  Every 6 hours PRN        12/23/23 2045              Amada Hallisey H, PA-C 12/23/23 2101    Sueellen Emery, MD 01/02/24 [phone]

## 2023-12-25 ENCOUNTER — Observation Stay (HOSPITAL_COMMUNITY)
Admission: EM | Admit: 2023-12-25 | Discharge: 2023-12-27 | Disposition: A | Discharge status: Home / Self Care | Payer: Self-pay | Attending: General Surgery | Admitting: General Surgery

## 2023-12-25 ENCOUNTER — Other Ambulatory Visit: Payer: Self-pay

## 2023-12-25 DIAGNOSIS — K611 Rectal abscess: Principal | ICD-10-CM | POA: Insufficient documentation

## 2023-12-25 DIAGNOSIS — J45909 Unspecified asthma, uncomplicated: Secondary | ICD-10-CM | POA: Insufficient documentation

## 2023-12-25 DIAGNOSIS — Z79899 Other long term (current) drug therapy: Secondary | ICD-10-CM | POA: Insufficient documentation

## 2023-12-25 DIAGNOSIS — F17210 Nicotine dependence, cigarettes, uncomplicated: Secondary | ICD-10-CM | POA: Insufficient documentation

## 2023-12-25 DIAGNOSIS — I10 Essential (primary) hypertension: Secondary | ICD-10-CM | POA: Insufficient documentation

## 2023-12-25 NOTE — ED Triage Notes (Signed)
 Pt has an abscess near his rectum that is causing 10/10 pain and difficulty with bowel movements due to this. Pt is already on abx and pain meds with no relief.

## 2023-12-26 ENCOUNTER — Encounter (HOSPITAL_COMMUNITY)
Admission: EM | Disposition: A | Discharge status: Home / Self Care | Payer: Self-pay | Source: Home / Self Care | Attending: Emergency Medicine

## 2023-12-26 ENCOUNTER — Emergency Department (HOSPITAL_COMMUNITY): Payer: Self-pay

## 2023-12-26 ENCOUNTER — Encounter (HOSPITAL_COMMUNITY): Payer: Self-pay | Admitting: General Surgery

## 2023-12-26 ENCOUNTER — Emergency Department (HOSPITAL_COMMUNITY): Payer: Self-pay | Admitting: Anesthesiology

## 2023-12-26 ENCOUNTER — Other Ambulatory Visit: Payer: Self-pay

## 2023-12-26 ENCOUNTER — Emergency Department (HOSPITAL_BASED_OUTPATIENT_CLINIC_OR_DEPARTMENT_OTHER): Payer: Self-pay | Admitting: Anesthesiology

## 2023-12-26 DIAGNOSIS — K611 Rectal abscess: Secondary | ICD-10-CM

## 2023-12-26 DIAGNOSIS — F17210 Nicotine dependence, cigarettes, uncomplicated: Secondary | ICD-10-CM

## 2023-12-26 DIAGNOSIS — I10 Essential (primary) hypertension: Secondary | ICD-10-CM

## 2023-12-26 DIAGNOSIS — J45909 Unspecified asthma, uncomplicated: Secondary | ICD-10-CM

## 2023-12-26 LAB — CBC WITH DIFFERENTIAL/PLATELET
Abs Immature Granulocytes: 0.03 10*3/uL (ref 0.00–0.07)
Basophils Absolute: 0.1 10*3/uL (ref 0.0–0.1)
Basophils Relative: 1 %
Eosinophils Absolute: 0 10*3/uL (ref 0.0–0.5)
Eosinophils Relative: 0 %
HCT: 40.2 % (ref 39.0–52.0)
Hemoglobin: 13.3 g/dL (ref 13.0–17.0)
Immature Granulocytes: 0 %
Lymphocytes Relative: 16 %
Lymphs Abs: 1.6 10*3/uL (ref 0.7–4.0)
MCH: 30.9 pg (ref 26.0–34.0)
MCHC: 33.1 g/dL (ref 30.0–36.0)
MCV: 93.3 fL (ref 80.0–100.0)
Monocytes Absolute: 1.3 10*3/uL — ABNORMAL HIGH (ref 0.1–1.0)
Monocytes Relative: 13 %
Neutro Abs: 6.9 10*3/uL (ref 1.7–7.7)
Neutrophils Relative %: 70 %
Platelets: 368 10*3/uL (ref 150–400)
RBC: 4.31 MIL/uL (ref 4.22–5.81)
RDW: 13.6 % (ref 11.5–15.5)
WBC: 10 10*3/uL (ref 4.0–10.5)
nRBC: 0 % (ref 0.0–0.2)

## 2023-12-26 LAB — COMPREHENSIVE METABOLIC PANEL WITH GFR
ALT: 30 U/L (ref 0–44)
AST: 21 U/L (ref 15–41)
Albumin: 3.5 g/dL (ref 3.5–5.0)
Alkaline Phosphatase: 41 U/L (ref 38–126)
Anion gap: 13 (ref 5–15)
BUN: 7 mg/dL (ref 6–20)
CO2: 26 mmol/L (ref 22–32)
Calcium: 9.2 mg/dL (ref 8.9–10.3)
Chloride: 99 mmol/L (ref 98–111)
Creatinine, Ser: 0.97 mg/dL (ref 0.61–1.24)
GFR, Estimated: >60 mL/min (ref >60)
Glucose, Bld: 99 mg/dL (ref 70–99)
Potassium: 3.7 mmol/L (ref 3.5–5.1)
Sodium: 138 mmol/L (ref 135–145)
Total Bilirubin: 0.5 mg/dL (ref 0.0–1.2)
Total Protein: 7 g/dL (ref 6.5–8.1)

## 2023-12-26 LAB — I-STAT CG4 LACTIC ACID, ED
Lactic Acid, Venous: 0.9 mmol/L (ref 0.5–1.9)
Lactic Acid, Venous: 0.6 mmol/L (ref 0.5–1.9)

## 2023-12-26 LAB — I-STAT CHEM 8, ED
BUN: 5 mg/dL — ABNORMAL LOW (ref 6–20)
Calcium, Ion: 1.16 mmol/L (ref 1.15–1.40)
Chloride: 102 mmol/L (ref 98–111)
Creatinine, Ser: 1 mg/dL (ref 0.61–1.24)
Glucose, Bld: 91 mg/dL (ref 70–99)
HCT: 38 % — ABNORMAL LOW (ref 39.0–52.0)
Hemoglobin: 12.9 g/dL — ABNORMAL LOW (ref 13.0–17.0)
Potassium: 3.8 mmol/L (ref 3.5–5.1)
Sodium: 137 mmol/L (ref 135–145)
TCO2: 25 mmol/L (ref 22–32)

## 2023-12-26 LAB — URINALYSIS, ROUTINE W REFLEX MICROSCOPIC
Bilirubin Urine: NEGATIVE
Glucose, UA: NEGATIVE mg/dL
Hgb urine dipstick: NEGATIVE
Ketones, ur: NEGATIVE mg/dL
Leukocytes,Ua: NEGATIVE
Nitrite: NEGATIVE
Protein, ur: NEGATIVE mg/dL
Specific Gravity, Urine: >1.046 — ABNORMAL HIGH (ref 1.005–1.030)
pH: 6 (ref 5.0–8.0)

## 2023-12-26 LAB — LIPASE, BLOOD: Lipase: 28 U/L (ref 11–51)

## 2023-12-26 SURGERY — EXAM UNDER ANESTHESIA, RECTUM
Anesthesia: General

## 2023-12-26 MED ORDER — SIMETHICONE 80 MG PO CHEW
40.0000 mg | CHEWABLE_TABLET | Freq: Four times a day (QID) | ORAL | Status: DC | PRN
  Administered 2023-12-26: 40 mg via ORAL
  Filled 2023-12-26: qty 1

## 2023-12-26 MED ORDER — FENTANYL CITRATE PF 50 MCG/ML IJ SOSY
PREFILLED_SYRINGE | INTRAMUSCULAR | Status: AC
  Filled 2023-12-26: qty 1

## 2023-12-26 MED ORDER — DOCUSATE SODIUM 100 MG PO CAPS
100.0000 mg | ORAL_CAPSULE | Freq: Two times a day (BID) | ORAL | Status: DC
  Administered 2023-12-27: 100 mg via ORAL
  Filled 2023-12-26: qty 1

## 2023-12-26 MED ORDER — AMLODIPINE BESYLATE 5 MG PO TABS
5.0000 mg | ORAL_TABLET | Freq: Every day | ORAL | Status: DC
  Administered 2023-12-27: 5 mg via ORAL
  Filled 2023-12-26: qty 1

## 2023-12-26 MED ORDER — LIDOCAINE HCL (PF) 2 % IJ SOLN
INTRAMUSCULAR | Status: AC
  Filled 2023-12-26: qty 5

## 2023-12-26 MED ORDER — ONDANSETRON 4 MG PO TBDP: 4.0000 mg | ORAL_TABLET | Freq: Four times a day (QID) | ORAL | Status: DC | PRN

## 2023-12-26 MED ORDER — METRONIDAZOLE 500 MG/100ML IV SOLN
500.0000 mg | Freq: Two times a day (BID) | INTRAVENOUS | Status: DC
  Administered 2023-12-26 – 2023-12-27 (×2): 500 mg via INTRAVENOUS
  Filled 2023-12-26 (×2): qty 100

## 2023-12-26 MED ORDER — KETOROLAC TROMETHAMINE 30 MG/ML IJ SOLN
30.0000 mg | Freq: Once | INTRAMUSCULAR | Status: AC | PRN
  Administered 2023-12-26: 30 mg via INTRAVENOUS

## 2023-12-26 MED ORDER — OXYCODONE HCL 5 MG PO TABS
ORAL_TABLET | ORAL | Status: AC
  Filled 2023-12-26: qty 1

## 2023-12-26 MED ORDER — HYDROMORPHONE HCL 1 MG/ML IJ SOLN
1.0000 mg | Freq: Once | INTRAMUSCULAR | Status: AC
  Administered 2023-12-26: 1 mg via INTRAVENOUS
  Filled 2023-12-26: qty 1

## 2023-12-26 MED ORDER — ENOXAPARIN SODIUM 40 MG/0.4ML IJ SOSY
40.0000 mg | PREFILLED_SYRINGE | INTRAMUSCULAR | Status: DC
  Administered 2023-12-27: 40 mg via SUBCUTANEOUS
  Filled 2023-12-26: qty 0.4

## 2023-12-26 MED ORDER — SODIUM CHLORIDE 0.9 % IV BOLUS
1000.0000 mL | Freq: Once | INTRAVENOUS | Status: AC
  Administered 2023-12-26: 1000 mL via INTRAVENOUS

## 2023-12-26 MED ORDER — SODIUM CHLORIDE 0.9 % IV SOLN
2.0000 g | Freq: Once | INTRAVENOUS | Status: AC
  Administered 2023-12-26: 2 g via INTRAVENOUS
  Filled 2023-12-26: qty 12.5

## 2023-12-26 MED ORDER — MIDAZOLAM HCL 5 MG/5ML IJ SOLN
INTRAMUSCULAR | Status: DC | PRN
  Administered 2023-12-26: 2 mg via INTRAVENOUS

## 2023-12-26 MED ORDER — OXYCODONE HCL 5 MG PO TABS
5.0000 mg | ORAL_TABLET | Freq: Once | ORAL | Status: AC | PRN
  Administered 2023-12-26: 5 mg via ORAL

## 2023-12-26 MED ORDER — ACETAMINOPHEN 10 MG/ML IV SOLN
1000.0000 mg | Freq: Once | INTRAVENOUS | Status: DC | PRN
  Administered 2023-12-26: 1000 mg via INTRAVENOUS

## 2023-12-26 MED ORDER — 0.9 % SODIUM CHLORIDE (POUR BTL) OPTIME
TOPICAL | Status: DC | PRN
  Administered 2023-12-26: 1000 mL

## 2023-12-26 MED ORDER — FENTANYL CITRATE (PF) 100 MCG/2ML IJ SOLN
INTRAMUSCULAR | Status: AC
  Filled 2023-12-26: qty 2

## 2023-12-26 MED ORDER — HYDROMORPHONE HCL 1 MG/ML IJ SOLN
1.0000 mg | INTRAMUSCULAR | Status: DC | PRN
  Administered 2023-12-26: 1 mg via INTRAVENOUS
  Filled 2023-12-26: qty 1

## 2023-12-26 MED ORDER — LACTATED RINGERS IV SOLN: INTRAVENOUS | Status: DC

## 2023-12-26 MED ORDER — FENTANYL CITRATE (PF) 100 MCG/2ML IJ SOLN
INTRAMUSCULAR | Status: DC | PRN
  Administered 2023-12-26: 100 ug via INTRAVENOUS

## 2023-12-26 MED ORDER — EPHEDRINE SULFATE-NACL 50-0.9 MG/10ML-% IV SOSY
PREFILLED_SYRINGE | INTRAVENOUS | Status: DC | PRN
  Administered 2023-12-26: 10 mg via INTRAVENOUS

## 2023-12-26 MED ORDER — OXYCODONE HCL 5 MG PO TABS
5.0000 mg | ORAL_TABLET | ORAL | Status: DC | PRN
  Administered 2023-12-26 (×2): 10 mg via ORAL
  Administered 2023-12-27: 5 mg via ORAL
  Filled 2023-12-26: qty 1
  Filled 2023-12-26 (×2): qty 2

## 2023-12-26 MED ORDER — SODIUM CHLORIDE 0.9 % IV SOLN
2.0000 g | Freq: Three times a day (TID) | INTRAVENOUS | Status: DC
  Administered 2023-12-26 – 2023-12-27 (×2): 2 g via INTRAVENOUS
  Filled 2023-12-26 (×4): qty 12.5

## 2023-12-26 MED ORDER — ONDANSETRON HCL 4 MG/2ML IJ SOLN
4.0000 mg | Freq: Four times a day (QID) | INTRAMUSCULAR | Status: DC | PRN
  Administered 2023-12-26: 4 mg via INTRAVENOUS
  Filled 2023-12-26: qty 2

## 2023-12-26 MED ORDER — KCL IN DEXTROSE-NACL 20-5-0.45 MEQ/L-%-% IV SOLN
INTRAVENOUS | Status: DC
  Filled 2023-12-26: qty 1000

## 2023-12-26 MED ORDER — AMISULPRIDE (ANTIEMETIC) 5 MG/2ML IV SOLN: 10.0000 mg | Freq: Once | INTRAVENOUS | Status: DC | PRN

## 2023-12-26 MED ORDER — ONDANSETRON HCL 4 MG/2ML IJ SOLN
INTRAMUSCULAR | Status: DC | PRN
  Administered 2023-12-26: 4 mg via INTRAVENOUS

## 2023-12-26 MED ORDER — ACETAMINOPHEN 10 MG/ML IV SOLN
INTRAVENOUS | Status: AC
Start: 2023-12-26 — End: 2023-12-27
  Filled 2023-12-26: qty 100

## 2023-12-26 MED ORDER — CHLORHEXIDINE GLUCONATE 0.12 % MT SOLN
15.0000 mL | Freq: Once | OROMUCOSAL | Status: AC
  Administered 2023-12-26: 15 mL via OROMUCOSAL

## 2023-12-26 MED ORDER — HYDROMORPHONE HCL 1 MG/ML IJ SOLN
1.0000 mg | INTRAMUSCULAR | Status: DC | PRN
  Administered 2023-12-26 – 2023-12-27 (×3): 1 mg via INTRAVENOUS
  Filled 2023-12-26 (×4): qty 1

## 2023-12-26 MED ORDER — PROPOFOL 10 MG/ML IV BOLUS
INTRAVENOUS | Status: DC | PRN
  Administered 2023-12-26: 200 mg via INTRAVENOUS

## 2023-12-26 MED ORDER — PROPOFOL 10 MG/ML IV BOLUS
INTRAVENOUS | Status: AC
  Filled 2023-12-26: qty 20

## 2023-12-26 MED ORDER — ALBUTEROL SULFATE (2.5 MG/3ML) 0.083% IN NEBU
3.0000 mL | INHALATION_SOLUTION | Freq: Four times a day (QID) | RESPIRATORY_TRACT | Status: DC | PRN
  Administered 2023-12-27: 3 mL via RESPIRATORY_TRACT
  Filled 2023-12-26: qty 3

## 2023-12-26 MED ORDER — OXYCODONE HCL 5 MG/5ML PO SOLN: 5.0000 mg | Freq: Once | ORAL | Status: AC | PRN

## 2023-12-26 MED ORDER — POLYETHYLENE GLYCOL 3350 17 G PO PACK: 17.0000 g | PACK | Freq: Every day | ORAL | Status: DC | PRN

## 2023-12-26 MED ORDER — METRONIDAZOLE 500 MG/100ML IV SOLN
500.0000 mg | Freq: Once | INTRAVENOUS | Status: AC
  Administered 2023-12-26: 500 mg via INTRAVENOUS
  Filled 2023-12-26: qty 100

## 2023-12-26 MED ORDER — FENTANYL CITRATE PF 50 MCG/ML IJ SOSY
25.0000 ug | PREFILLED_SYRINGE | INTRAMUSCULAR | Status: DC | PRN
  Administered 2023-12-26 (×3): 50 ug via INTRAVENOUS

## 2023-12-26 MED ORDER — BUPIVACAINE-EPINEPHRINE (PF) 0.25% -1:200000 IJ SOLN
INTRAMUSCULAR | Status: AC
  Filled 2023-12-26: qty 30

## 2023-12-26 MED ORDER — IOHEXOL 300 MG/ML  SOLN
100.0000 mL | Freq: Once | INTRAMUSCULAR | Status: AC | PRN
  Administered 2023-12-26: 100 mL via INTRAVENOUS

## 2023-12-26 MED ORDER — KETOROLAC TROMETHAMINE 30 MG/ML IJ SOLN
INTRAMUSCULAR | Status: AC
  Filled 2023-12-26: qty 1

## 2023-12-26 MED ORDER — ACETAMINOPHEN 500 MG PO TABS
1000.0000 mg | ORAL_TABLET | Freq: Four times a day (QID) | ORAL | Status: DC
  Administered 2023-12-26 – 2023-12-27 (×2): 1000 mg via ORAL
  Filled 2023-12-26 (×2): qty 2

## 2023-12-26 MED ORDER — LIDOCAINE HCL (CARDIAC) PF 100 MG/5ML IV SOSY
PREFILLED_SYRINGE | INTRAVENOUS | Status: DC | PRN
  Administered 2023-12-26 (×2): 50 mg via INTRAVENOUS

## 2023-12-26 MED ORDER — MIDAZOLAM HCL 2 MG/2ML IJ SOLN
INTRAMUSCULAR | Status: AC
  Filled 2023-12-26: qty 2

## 2023-12-26 MED ORDER — ONDANSETRON HCL 4 MG/2ML IJ SOLN
INTRAMUSCULAR | Status: AC
  Filled 2023-12-26: qty 2

## 2023-12-26 MED ORDER — FENTANYL CITRATE PF 50 MCG/ML IJ SOSY
PREFILLED_SYRINGE | INTRAMUSCULAR | Status: AC
  Filled 2023-12-26: qty 2

## 2023-12-26 SURGICAL SUPPLY — 37 items
BAG COUNTER SPONGE SURGICOUNT (BAG) IMPLANT
BNDG GAUZE DERMACEA FLUFF 4 (GAUZE/BANDAGES/DRESSINGS) IMPLANT
COVER SURGICAL LIGHT HANDLE (MISCELLANEOUS) ×1 IMPLANT
DERMABOND ADVANCED .7 DNX12 (GAUZE/BANDAGES/DRESSINGS) IMPLANT
DRAIN PENROSE 0.25X18 (DRAIN) IMPLANT
DRAPE LAPAROSCOPIC ABDOMINAL (DRAPES) IMPLANT
DRAPE LAPAROTOMY T 102X78X121 (DRAPES) IMPLANT
DRAPE LAPAROTOMY T 98X78 PEDS (DRAPES) IMPLANT
DRAPE LAPAROTOMY TRNSV 102X78 (DRAPES) IMPLANT
DRAPE SHEET LG 3/4 BI-LAMINATE (DRAPES) IMPLANT
DRAPE UTILITY XL STRL (DRAPES) ×1 IMPLANT
ELECT CAUTERY BLADE TIP 2.5 (TIP) IMPLANT
ELECT REM PT RETURN 15FT ADLT (MISCELLANEOUS) ×1 IMPLANT
ELECTRODE CAUTERY BLDE TIP 2.5 (TIP) IMPLANT
GAUZE PACKING 1/2INX5YD STRL (GAUZE/BANDAGES/DRESSINGS) IMPLANT
GAUZE PACKING IODOFORM 1/4X15 (PACKING) IMPLANT
GAUZE PAD ABD 8X10 STRL (GAUZE/BANDAGES/DRESSINGS) IMPLANT
GAUZE SPONGE 4X4 12PLY STRL (GAUZE/BANDAGES/DRESSINGS) ×1 IMPLANT
GLOVE BIO SURGEON STRL SZ7.5 (GLOVE) ×1 IMPLANT
GLOVE INDICATOR 8.0 STRL GRN (GLOVE) ×1 IMPLANT
GOWN STRL REUS W/ TWL XL LVL3 (GOWN DISPOSABLE) ×2 IMPLANT
KIT BASIN OR (CUSTOM PROCEDURE TRAY) ×1 IMPLANT
KIT TURNOVER KIT A (KITS) IMPLANT
MARKER SKIN DUAL TIP RULER LAB (MISCELLANEOUS) IMPLANT
NDL HYPO 25X1 1.5 SAFETY (NEEDLE) ×1 IMPLANT
NEEDLE HYPO 25X1 1.5 SAFETY (NEEDLE) ×1 IMPLANT
PACK GENERAL/GYN (CUSTOM PROCEDURE TRAY) ×1 IMPLANT
SET HNDPC FAN SPRY TIP SCT (DISPOSABLE) IMPLANT
SPIKE FLUID TRANSFER (MISCELLANEOUS) IMPLANT
SPONGE T-LAP 4X18 ~~LOC~~+RFID (SPONGE) IMPLANT
STAPLER SKIN PROX WIDE 3.9 (STAPLE) IMPLANT
SUT MNCRL AB 4-0 PS2 18 (SUTURE) IMPLANT
SUT VIC AB 3-0 SH 18 (SUTURE) IMPLANT
SWAB COLLECTION DEVICE MRSA (MISCELLANEOUS) IMPLANT
SWAB CULTURE ESWAB REG 1ML (MISCELLANEOUS) IMPLANT
SYR CONTROL 10ML LL (SYRINGE) ×1 IMPLANT
TOWEL OR 17X26 10 PK STRL BLUE (TOWEL DISPOSABLE) ×1 IMPLANT

## 2023-12-26 NOTE — ED Notes (Signed)
 Surgical short stay called pt is going to - room 9  Rn asked to prepare pt by having him Brush teeth, do the CHG 6 wipes and have him in a New gown.    Call received from [place], [place]

## 2023-12-26 NOTE — Anesthesia Procedure Notes (Signed)
 Procedure Name: LMA Insertion Date/Time: 12/26/2023 3:37 PM  Performed by: Roseland Cid, CRNAPre-anesthesia Checklist: Patient identified, Emergency Drugs available, Suction available, Patient being monitored and Timeout performed Patient Re-evaluated:Patient Re-evaluated prior to induction Oxygen Delivery Method: Circle system utilized Preoxygenation: Pre-oxygenation with 100% oxygen Induction Type: IV induction Ventilation: Mask ventilation without difficulty LMA: LMA inserted LMA Size: 5.0 Tube type: Oral Number of attempts: 1 Placement Confirmation: positive ETCO2 and breath sounds checked- equal and bilateral Secured at: 22 cm Dental Injury: Teeth and Oropharynx as per pre-operative assessment

## 2023-12-26 NOTE — Transfer of Care (Signed)
 Immediate Anesthesia Transfer of Care Note  Patient: Jay Gentry  Procedure(s) Performed: EXAM UNDER ANESTHESIA, RECTUM; ANOSCOPY IRRIGATION AND DEBRIDEMENT OF LEFT PERIRECTAL ABSCESS  Patient Location: PACU  Anesthesia Type:General  Level of Consciousness: awake and alert   Airway & Oxygen Therapy: Patient Spontanous Breathing and Patient connected to nasal cannula oxygen  Post-op Assessment: Report given to RN and Post -op Vital signs reviewed and stable  Post vital signs: Reviewed and stable  Last Vitals:  Vitals Value Taken Time  BP 153/102 12/26/23 1616  Temp    Pulse 70 12/26/23 1618  Resp 16 12/26/23 1618  SpO2 100 % 12/26/23 1618  Vitals shown include unfiled device data.  Last Pain:  Vitals:   12/26/23 1422  TempSrc:   PainSc: 9          Complications: No notable events documented.

## 2023-12-26 NOTE — ED Provider Notes (Signed)
 [place] EMERGENCY DEPARTMENT AT [facility] Provider Note   CSN: [id number] Arrival date & time: 12/25/23  2230     History  Chief Complaint  Patient presents with   Abscess    Jay Gentry is a 48 y.o. male.  HPI Patient has had worsening rectal pain and swelling since having a rectal abscess drained and aspirated on separate occasions over the past week.  Previous experience with rectal abscesses several years ago but does not have any known chronic fistula or inflammatory bowel disease.  Patient denies he has had fever.  He reports he has had some chills.  He has had nausea.  He reports some vague abdominal discomfort more like bloating.  Patient reports that he thinks he is getting constipated from the antibiotics and pain medications.  No history of insertive rectal practices.  Patient has been taking ciprofloxacin and Flagyl.  He completed a first course of Cipro and Flagyl.  And for the past 3 days has been taking ciprofloxacin alone because he could not afford the repeat prescription for the Flagyl.    Home Medications Prior to Admission medications   Medication Sig Start Date End Date Taking? Authorizing Provider  acetaminophen (TYLENOL) 500 MG tablet Take 2 tablets (1,000 mg total) by mouth every 8 (eight) hours as needed for mild pain or headache. 11/14/18   Juliet Rude, PA-C  albuterol (PROVENTIL) (2.5 MG/3ML) 0.083% nebulizer solution Take 3 mLs (2.5 mg total) by nebulization every 4 (four) hours as needed for wheezing or shortness of breath. 04/14/22   Corwin Levins, MD  albuterol (VENTOLIN HFA) 108 (90 Base) MCG/ACT inhaler INHALE 1 TO 2 PUFFS INTO THE LUNGS EVERY 6 HOURS AS NEEDED 06/06/23   Corwin Levins, MD  amLODipine (NORVASC) 5 MG tablet TAKE 1 TABLET(5 MG) BY MOUTH DAILY 06/10/23   Corwin Levins, MD  aspirin EC 81 MG tablet Take 81-162 mg by mouth every 6 (six) hours as needed for moderate pain. Swallow whole.    [provider]   ciprofloxacin (CIPRO) 500 MG tablet Take 1 tablet (500 mg total) by mouth 2 (two) times daily for 10 days. 12/23/23 01/02/24  Karie Soda, MD  metroNIDAZOLE (FLAGYL) 500 MG tablet Take 1 tablet (500 mg total) by mouth 2 (two) times daily for 10 days. 12/23/23 01/02/24  Karie Soda, MD  omeprazole (PRILOSEC) 40 MG capsule Take 1 capsule (40 mg total) by mouth daily. 04/14/22   Corwin Levins, MD  oxyCODONE (OXY IR/ROXICODONE) 5 MG immediate release tablet Take 1 tablet (5 mg total) by mouth every 6 (six) hours as needed for moderate pain (pain score 4-6) or severe pain (pain score 7-10). 12/23/23   Prosperi, Christian H, PA-C      Allergies    Penicillins and Penicillins    Review of Systems   Review of Systems  Physical Exam Updated Vital Signs BP 108/70   Pulse 66   Temp 98.1 F (36.7 C)   Resp 18   SpO2 99%  Physical Exam Constitutional:      Comments: Alert nontoxic well-nourished well-developed  HENT:     Mouth/Throat:     Pharynx: Oropharynx is clear.  Eyes:     Extraocular Movements: Extraocular movements intact.  Cardiovascular:     Rate and Rhythm: Normal rate and regular rhythm.  Pulmonary:     Effort: Pulmonary effort is normal.     Breath sounds: Normal breath sounds.  Abdominal:     General:  There is no distension.     Palpations: Abdomen is soft.     Tenderness: There is no abdominal tenderness. There is no guarding.  Genitourinary:    Comments: Left gluteus has an approximately 15 cm firm very tender diffuse mass along the gluteal crease and muscle.  External anal exam normal appearance.  Digital exam very tender left rectal area firm.  I cannot appreciate fluctuance.  Trace yellow-brown stool in the vault.  No visible blood. Musculoskeletal:        General: Normal range of motion.  Skin:    General: Skin is warm and dry.  Neurological:     General: No focal deficit present.     Mental Status: He is oriented to person, place, and time.  Psychiatric:         Mood and Affect: Mood normal.     ED Results / Procedures / Treatments   Labs (all labs ordered are listed, but only abnormal results are displayed) Labs Reviewed  CBC WITH DIFFERENTIAL/PLATELET - Abnormal; Notable for the following components:      Result Value   Monocytes Absolute 1.3 (*)    All other components within normal limits  COMPREHENSIVE METABOLIC PANEL WITH GFR  LIPASE, BLOOD  URINALYSIS, ROUTINE W REFLEX MICROSCOPIC  I-STAT CG4 LACTIC ACID, ED    EKG None  Radiology CT ABDOMEN PELVIS W CONTRAST Result Date: 12/26/2023 CLINICAL DATA:  Abdominal pain.  Perirectal abscess. EXAM: CT ABDOMEN AND PELVIS WITH CONTRAST TECHNIQUE: Multidetector CT imaging of the abdomen and pelvis was performed using the standard protocol following bolus administration of intravenous contrast. RADIATION DOSE REDUCTION: This exam was performed according to the departmental dose-optimization program which includes automated exposure control, adjustment of the mA and/or kV according to patient size and/or use of iterative reconstruction technique. CONTRAST:  OMNIPAQUE IOHEXOL 300 MG/ML  SOLN COMPARISON:  Pelvis CT 12/18/2023 FINDINGS: Lower chest: No acute findings. Hepatobiliary: A tiny hypodensity in the liver parenchyma is too small to characterize but is statistically most likely benign. No followup imaging is recommended. Small area of low attenuation in the anterior liver, adjacent to the falciform ligament, is in a characteristic location for focal fatty deposition. Gallbladder is nondistended. No intrahepatic or extrahepatic biliary dilation. Pancreas: No focal mass lesion. No dilatation of the main duct. No intraparenchymal cyst. No peripancreatic edema. Spleen: No splenomegaly. No suspicious focal mass lesion. Adrenals/Urinary Tract: No adrenal nodule or mass. Tiny well-defined homogeneous low-density lesions in both kidneys are too small to characterize but are statistically most likely  benign and probably cysts. No followup imaging is recommended. No evidence for hydroureter. The urinary bladder appears normal for the degree of distention. Stomach/Bowel: Stomach is unremarkable. No gastric wall thickening. No evidence of outlet obstruction. Duodenum is normally positioned as is the ligament of Treitz. No small bowel wall thickening. No small bowel dilatation. The terminal ileum is normal. The appendix is normal. No gross colonic mass. No colonic wall thickening. There is some hyperenhancement in the low rectum/anal region. Irregular ill-defined rim enhancing fluid collection in the left perianal region measures 3.1 x 2.2 cm today, increased from 1.8 x 1.7 cm previously. There is surrounding edema/inflammation in the subcutaneous fat. Vascular/Lymphatic: There is mild atherosclerotic calcification of the abdominal aorta without aneurysm. There is no gastrohepatic or hepatoduodenal ligament lymphadenopathy. No retroperitoneal or mesenteric lymphadenopathy. No pelvic sidewall lymphadenopathy. Reproductive: The prostate gland and seminal vesicles are unremarkable. Other: No intraperitoneal free fluid. Musculoskeletal: No worrisome lytic or sclerotic osseous  abnormality. IMPRESSION: 1. Irregular ill-defined rim enhancing fluid collection in the left perianal region measures 3.1 x 2.2 cm today, increased from 1.8 x 1.7 cm previously and compatible with abscess. There is surrounding edema/inflammation in the subcutaneous fat. 2. No other acute findings in the abdomen or pelvis. 3. Aortic atherosclerosis. Electronically Signed   By: Donnal Fusi M.D.   On: 12/26/2023 07:56    Procedures Procedures    Medications Ordered in ED Medications  ceFEPIme (MAXIPIME) 2 g in sodium chloride 0.9 % 100 mL IVPB (2 g Intravenous New Bag/Given 12/26/23 0817)    And  metroNIDAZOLE (FLAGYL) IVPB 500 mg (has no administration in time range)  sodium chloride 0.9 % bolus 1,000 mL (1,000 mLs Intravenous New  Bag/Given 12/26/23 0830)  HYDROmorphone (DILAUDID) injection 1 mg (1 mg Intravenous Given 12/26/23 0801)  iohexol (OMNIPAQUE) 300 MG/ML solution 100 mL (100 mLs Intravenous Contrast Given 12/26/23 0737)    ED Course/ Medical Decision Making/ A&P                                 Medical Decision Making Amount and/or Complexity of Data Reviewed Labs: ordered.  Risk Prescription drug management. Decision regarding hospitalization.   Patient presents as outlined.  He has had recrudescence of significant rectal pain and swelling after treatment with surgical aspiration and 10 days of antibiotic therapy.  Will initiate IV antibiotic therapy with cefepime and Flagyl for failed outpatient treatment and proceed with CT scan to identify extent of abscess and/or cellulitis or deeper space infection.  Lactic 0.9, white count 10.  Normal metabolic panel.  CT scan for further radiology positive for enlarging and ill-defined abscess left perianal area with diffuse inflammatory soft tissue changes.  Consult: Reviewed with Cary Clarks of general surgery.  General surgery will evaluate patient in the emergency department for definitive care.  Patient presents as outlined.  He has confirmed worsening perianal abscess failing outpatient treatment and previous drainage.  At this time we will plan for definitive care with admission and IV antibiotics and interventional management by general surgery.          Final Clinical Impression(s) / ED Diagnoses Final diagnoses:  Perirectal abscess    Rx / DC Orders ED Discharge Orders     None         Wynetta Heckle, MD 12/26/23 [phone]

## 2023-12-26 NOTE — Op Note (Signed)
 12/26/2023  4:24 PM  PATIENT:  Jay Gentry  48 y.o. male  PRE-OPERATIVE DIAGNOSIS:  RECURRENT LEFT PERIRECTAL ABCESS  POST-OPERATIVE DIAGNOSIS:  SAME  PROCEDURE:  Procedure(s): EXAM UNDER ANESTHESIA, RECTUM; ANOSCOPY IRRIGATION AND INCISION AND DRAINAGE OF LEFT PERIRECTAL ABSCESS  SURGEON:  Surgeon(s): Gaynelle Adu, MD  ASSISTANTS: none   ANESTHESIA:   general  DRAINS:  penrose drain in abscess cavity    LOCAL MEDICATIONS USED:  NONE  SPECIMEN:  Source of Specimen:  Aerobic and anaerobic cultures of abscess  DISPOSITION OF SPECIMEN:   Micro  COUNTS:  YES  INDICATION FOR PROCEDURE: 48 year old gentleman with a history of left perirectal abscess that had required drainage back in 2016 and recurrence in 2018 also requiring drainage in the operating room return to the ER for worsening pain and swelling near his rectum.  He had the abscess aspirated and then drained in the emergency room on April 6.  He was sent out on oral antibiotics.  He had recurrent discomfort.  A CT scan in the ER showed an increasing left perirectal abscess.  We recommended to the operating room for incision and drainage and exam under anesthesia.  We discussed the possibility of fistula in ano since this is the third recurrence in a similar location.  Risk and benefits were discussed with the patient separately documented  PROCEDURE: After obtaining informed consent the patient was taken the OR for at [facility] placed upon on the operating room table.  General endotracheal anesthesia was established.  Sequential compression devices were placed.  He was placed in lithotomy position.  His buttocks perineum scrotum and penis were prepped and draped in the usual standard surgical fashion with Betadine.  The patient had received IV antibiotics prior to skin incision.  A surgical timeout was performed.  He had scarring on the left perianal skin area about an inch and a half from the anus.  There is  induration of about 5 x 5 cm in this area.  There is some nonthrombosed external hemorrhoidal tissue.  Digital rectal exam was performed which revealed no palpable masses.  Anoscopy was performed which demonstrated some minor grade 1 internal hemorrhoids mainly in the left lateral position.  Pressing on the area of induration on the perineal skin revealed no internal drainage.  Using a syringe with the needle I aspirated the abscess cavity in the left perirectal area I got out a fair amount of seropurulent material on aspiration.  Cultures were obtained.  Then using his old incision and scar area I incised the area for about 1 inch with the scalpel.  There was frank drainage of pus of about 20 to 30 cc.  Some of this was seropurulent as well.  Using a Tresa Endo I did carefully probed the abscess cavity.  While it did go up toward the anus and rectum I did not see an obvious fistula.  The cavity did track posterior toward the midline for about 4 cm.  I made a counterincision in that location and then threaded 1/4 inch Penrose through the abscess cavity out through the counterincision and secured it to itself with 2 nylon sutures.  The abscess cavity was irrigated with saline.  In order to help with hemostasis I packed the abscess cavity with half-inch packing strip.  There was good hemostasis.  Gauze, fluffs and mesh underwear were then placed.  The patient was placed in supine position extubated and taken to the recovery room in stable condition.  There were no  immediate complications.  All needle, instrument, and sponge counts were correct x 2.  There were no immediate complications.  PLAN OF CARE: Admit for overnight observation  PATIENT DISPOSITION:  PACU - hemodynamically stable.   Delay start of Pharmacological VTE agent (>24hrs) due to surgical blood loss or risk of bleeding:  no  Marianna Shirk. Elvan Hamel, MD, FACS General, Bariatric, & Minimally Invasive Surgery [facility],  [place]

## 2023-12-26 NOTE — Anesthesia Postprocedure Evaluation (Signed)
 Anesthesia Post Note  Patient: Jay Gentry  Procedure(s) Performed: EXAM UNDER ANESTHESIA, RECTUM; ANOSCOPY IRRIGATION AND DEBRIDEMENT OF LEFT PERIRECTAL ABSCESS     Patient location during evaluation: PACU Anesthesia Type: General Level of consciousness: awake Pain management: pain level controlled Vital Signs Assessment: post-procedure vital signs reviewed and stable Respiratory status: spontaneous breathing, nonlabored ventilation and respiratory function stable Cardiovascular status: blood pressure returned to baseline and stable Postop Assessment: no apparent nausea or vomiting Anesthetic complications: no   No notable events documented.  Last Vitals:  Vitals:   12/26/23 1715 12/26/23 1730  BP: 114/77 114/81  Pulse: (!) 52 (!) 58  Resp: 11 14  Temp: (!) 36.4 C 36.7 C  SpO2: 95% 99%    Last Pain:  Vitals:   12/26/23 1730  TempSrc: Oral  PainSc: 8                  Jay Gentry

## 2023-12-26 NOTE — Anesthesia Preprocedure Evaluation (Addendum)
 Anesthesia Evaluation  Patient identified by MRN, date of birth, ID band Patient awake    Reviewed: Allergy & Precautions, NPO status , Patient's Chart, lab work & pertinent test results  Airway Mallampati: II  TM Distance: >3 FB Neck ROM: Full    Dental no notable dental hx.    Pulmonary asthma , Current Smoker and Patient abstained from smoking.   Pulmonary exam normal        Cardiovascular hypertension, Pt. on medications Normal cardiovascular exam     Neuro/Psych  PSYCHIATRIC DISORDERS      negative neurological ROS     GI/Hepatic negative GI ROS, Neg liver ROS,,,  Endo/Other  negative endocrine ROS    Renal/GU negative Renal ROS     Musculoskeletal negative musculoskeletal ROS (+)    Abdominal   Peds  Hematology  (+) Blood dyscrasia, anemia   Anesthesia Other Findings PERIANAL ABCESS  Reproductive/Obstetrics                             Anesthesia Physical Anesthesia Plan  ASA: 3  Anesthesia Plan: General   Post-op Pain Management:    Induction: Intravenous  PONV Risk Score and Plan: 1 and Ondansetron, Dexamethasone, Midazolam and Treatment may vary due to age or medical condition  Airway Management Planned: Oral ETT  Additional Equipment:   Intra-op Plan:   Post-operative Plan: Extubation in OR  Informed Consent: I have reviewed the patients History and Physical, chart, labs and discussed the procedure including the risks, benefits and alternatives for the proposed anesthesia with the patient or authorized representative who has indicated his/her understanding and acceptance.     Dental advisory given  Plan Discussed with: CRNA  Anesthesia Plan Comments:        Anesthesia Quick Evaluation
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
## 2023-12-27 ENCOUNTER — Other Ambulatory Visit (HOSPITAL_COMMUNITY): Payer: Self-pay

## 2023-12-27 ENCOUNTER — Encounter (HOSPITAL_COMMUNITY): Payer: Self-pay | Admitting: General Surgery

## 2023-12-27 LAB — BASIC METABOLIC PANEL WITH GFR
Anion gap: 10 (ref 5–15)
BUN: 10 mg/dL (ref 6–20)
CO2: 24 mmol/L (ref 22–32)
Calcium: 8.4 mg/dL — ABNORMAL LOW (ref 8.9–10.3)
Chloride: 101 mmol/L (ref 98–111)
Creatinine, Ser: 1.05 mg/dL (ref 0.61–1.24)
GFR, Estimated: >60 mL/min (ref >60)
Glucose, Bld: 110 mg/dL — ABNORMAL HIGH (ref 70–99)
Potassium: 3.7 mmol/L (ref 3.5–5.1)
Sodium: 135 mmol/L (ref 135–145)

## 2023-12-27 LAB — CBC
HCT: 38.5 % — ABNORMAL LOW (ref 39.0–52.0)
Hemoglobin: 12.1 g/dL — ABNORMAL LOW (ref 13.0–17.0)
MCH: 30.6 pg (ref 26.0–34.0)
MCHC: 31.4 g/dL (ref 30.0–36.0)
MCV: 97.5 fL (ref 80.0–100.0)
Platelets: 345 10*3/uL (ref 150–400)
RBC: 3.95 MIL/uL — ABNORMAL LOW (ref 4.22–5.81)
RDW: 13.8 % (ref 11.5–15.5)
WBC: 7 10*3/uL (ref 4.0–10.5)
nRBC: 0 % (ref 0.0–0.2)

## 2023-12-27 MED ORDER — METRONIDAZOLE 500 MG PO TABS
500.0000 mg | ORAL_TABLET | Freq: Three times a day (TID) | ORAL | 0 refills | Status: AC
  Filled 2023-12-27: qty 15, 5d supply, fill #0

## 2023-12-27 MED ORDER — POLYETHYLENE GLYCOL 3350 17 G PO PACK: 17.0000 g | PACK | Freq: Two times a day (BID) | ORAL | Status: AC | PRN

## 2023-12-27 MED ORDER — DOCUSATE SODIUM 100 MG PO CAPS: 100.0000 mg | ORAL_CAPSULE | Freq: Two times a day (BID) | ORAL | Status: AC | PRN

## 2023-12-27 MED ORDER — CIPROFLOXACIN HCL 500 MG PO TABS
500.0000 mg | ORAL_TABLET | Freq: Two times a day (BID) | ORAL | 0 refills | Status: AC
  Filled 2023-12-27: qty 10, 5d supply, fill #0

## 2023-12-27 MED ORDER — OXYCODONE HCL 5 MG PO TABS
5.0000 mg | ORAL_TABLET | Freq: Four times a day (QID) | ORAL | 0 refills | Status: AC | PRN
  Filled 2023-12-27: qty 10, 3d supply, fill #0

## 2023-12-27 NOTE — TOC Transition Note (Signed)
 Transition of Care [facility]) - Discharge Note   Patient Details  Name: Jay Gentry MRN: [id number] Date of Birth: 1976-03-13  Transition of Care [facility]) CM/SW Contact:  Bari Leys, RN Phone Number: 12/27/2023, 10:51 AM   Clinical Narrative:   patient discharged prior to [facility] initial assessment.           Patient Goals and CMS Choice            Discharge Placement                       Discharge Plan and Services Additional resources added to the After Visit Summary for                                       Social Drivers of Health (SDOH) Interventions SDOH Screenings   Food Insecurity: No Food Insecurity (12/26/2023)  Housing: Low Risk  (12/26/2023)  Transportation Needs: No Transportation Needs (12/26/2023)  Utilities: Not At Risk (12/26/2023)  Alcohol Screen: Low Risk  (11/12/2018)  Depression (PHQ2-9): Low Risk  (06/10/2023)  Social Connections: Socially Integrated (12/26/2023)  Tobacco Use: High Risk (12/26/2023)     Readmission Risk Interventions     No data to display

## 2023-12-27 NOTE — Progress Notes (Signed)
 Patient discharged home, IV removed, discharge paperwork provided and explained, patient verbalized understanding, awaiting ordered medications to be delivered at the bedside prior to discharge.

## 2023-12-27 NOTE — Discharge Summary (Signed)
 [facility] Discharge Summary   Patient ID: Jay Gentry MRN: [id number] DOB/AGE: November 14, 1975 48 y.o.  Admit date: 12/25/2023 Discharge date: 12/27/2023  Admitting Diagnosis: Perirectal abscess [K61.1]   Discharge Diagnosis Perirectal abscess [K61.1]   Consultants none  Imaging: CT ABDOMEN PELVIS W CONTRAST Result Date: 12/26/2023 CLINICAL DATA:  Abdominal pain.  Perirectal abscess. EXAM: CT ABDOMEN AND PELVIS WITH CONTRAST TECHNIQUE: Multidetector CT imaging of the abdomen and pelvis was performed using the standard protocol following bolus administration of intravenous contrast. RADIATION DOSE REDUCTION: This exam was performed according to the departmental dose-optimization program which includes automated exposure control, adjustment of the mA and/or kV according to patient size and/or use of iterative reconstruction technique. CONTRAST:  OMNIPAQUE IOHEXOL 300 MG/ML  SOLN COMPARISON:  Pelvis CT 12/18/2023 FINDINGS: Lower chest: No acute findings. Hepatobiliary: A tiny hypodensity in the liver parenchyma is too small to characterize but is statistically most likely benign. No followup imaging is recommended. Small area of low attenuation in the anterior liver, adjacent to the falciform ligament, is in a characteristic location for focal fatty deposition. Gallbladder is nondistended. No intrahepatic or extrahepatic biliary dilation. Pancreas: No focal mass lesion. No dilatation of the main duct. No intraparenchymal cyst. No peripancreatic edema. Spleen: No splenomegaly. No suspicious focal mass lesion. Adrenals/Urinary Tract: No adrenal nodule or mass. Tiny well-defined homogeneous low-density lesions in both kidneys are too small to characterize but are statistically most likely benign and probably cysts. No followup imaging is recommended. No evidence for hydroureter. The urinary bladder appears normal for the degree of distention. Stomach/Bowel: Stomach is  unremarkable. No gastric wall thickening. No evidence of outlet obstruction. Duodenum is normally positioned as is the ligament of Treitz. No small bowel wall thickening. No small bowel dilatation. The terminal ileum is normal. The appendix is normal. No gross colonic mass. No colonic wall thickening. There is some hyperenhancement in the low rectum/anal region. Irregular ill-defined rim enhancing fluid collection in the left perianal region measures 3.1 x 2.2 cm today, increased from 1.8 x 1.7 cm previously. There is surrounding edema/inflammation in the subcutaneous fat. Vascular/Lymphatic: There is mild atherosclerotic calcification of the abdominal aorta without aneurysm. There is no gastrohepatic or hepatoduodenal ligament lymphadenopathy. No retroperitoneal or mesenteric lymphadenopathy. No pelvic sidewall lymphadenopathy. Reproductive: The prostate gland and seminal vesicles are unremarkable. Other: No intraperitoneal free fluid. Musculoskeletal: No worrisome lytic or sclerotic osseous abnormality. IMPRESSION: 1. Irregular ill-defined rim enhancing fluid collection in the left perianal region measures 3.1 x 2.2 cm today, increased from 1.8 x 1.7 cm previously and compatible with abscess. There is surrounding edema/inflammation in the subcutaneous fat. 2. No other acute findings in the abdomen or pelvis. 3. Aortic atherosclerosis. Electronically Signed   By: Donnal Fusi M.D.   On: 12/26/2023 07:56    Procedures Dr. Elvan Hamel (12/26/23) - EXAM UNDER ANESTHESIA, RECTUM; ANOSCOPY IRRIGATION AND INCISION AND DRAINAGE OF LEFT PERIRECTAL ABSCESS  Hospital Course:  48 y.o. male who presented to [facility] ED with perirectal pain and swelling in setting of known perianal abscess s/p prior aspiration and prior I&D.  Workup showed ongoing left perianal abscess.  Patient was admitted and underwent procedure listed above.  Tolerated procedure well and was transferred to the floor.  Regular diet resumed which he tolerated  well.  On POD1, the patient was voiding well, tolerating diet, ambulating well, pain well controlled, vital signs stable, incisions c/d/i and felt stable for discharge home.  Packing was removed from wound and drain left in place  to be checked at follow up. Patient will follow up in our office in 1 week and knows to call with questions or concerns. He will discharge on PO antibiotics to complete 5 day post procedure. He still has 5 days of ciprofloxacin at home to complete. I sent a new flagyl prescription. We discussed constipation management and I reccommended probiotic.  Physical Exam: General:  Alert, NAD, pleasant, comfortable GU: incision with penrose in place draining small amount of thin bloody fluid. Packing removed. Improved induration at site from prior to procedure  RN present for GU exam  I or a member of my team have reviewed this patient in the Controlled Substance Database.   Allergies as of 12/27/2023       Reactions   Penicillins Hives        Medication List     TAKE these medications    acetaminophen 500 MG tablet Commonly known as: TYLENOL Take 2 tablets (1,000 mg total) by mouth every 8 (eight) hours as needed for mild pain or headache.   albuterol (2.5 MG/3ML) 0.083% nebulizer solution Commonly known as: PROVENTIL Take 3 mLs (2.5 mg total) by nebulization every 4 (four) hours as needed for wheezing or shortness of breath.   albuterol 108 (90 Base) MCG/ACT inhaler Commonly known as: VENTOLIN HFA INHALE 1 TO 2 PUFFS INTO THE LUNGS EVERY 6 HOURS AS NEEDED   amLODipine 5 MG tablet Commonly known as: NORVASC TAKE 1 TABLET(5 MG) BY MOUTH DAILY   ciprofloxacin 500 MG tablet Commonly known as: Cipro Take 1 tablet (500 mg total) by mouth 2 (two) times daily for 10 days. What changed: Another medication with the same name was added. Make sure you understand how and when to take each.   ciprofloxacin 500 MG tablet Commonly known as: Cipro Take 1 tablet (500 mg  total) by mouth 2 (two) times daily for 5 days. What changed: You were already taking a medication with the same name, and this prescription was added. Make sure you understand how and when to take each.   docusate sodium 100 MG capsule Commonly known as: COLACE Take 1 capsule (100 mg total) by mouth 2 (two) times daily as needed for mild constipation or moderate constipation.   metroNIDAZOLE 500 MG tablet Commonly known as: Flagyl Take 1 tablet (500 mg total) by mouth 3 (three) times daily for 5 days.   oxyCODONE 5 MG immediate release tablet Commonly known as: Oxy IR/ROXICODONE Take 1 tablet (5 mg total) by mouth every 6 (six) hours as needed for up to 3 days for moderate pain (pain score 4-6). What changed: reasons to take this   polyethylene glycol 17 g packet Commonly known as: MIRALAX / GLYCOLAX Take 17 g by mouth 2 (two) times daily as needed for mild constipation or moderate constipation.          Follow-up Information     [facility], Hedda [place], [place]. Call.   Specialty: General Surgery Why: We are making a follow up appointment for you., Please call to confirm appointment time., Arrive 30 minutes early to complete check in, and bring photo ID and insurance card. Contact information: [address] [place] [address] [facility] [place] [address] [phone]                 Signed: Eric Form , [facility] 12/27/2023, 9:17 AM Please see Amion for pager number during day hours 7:00am-4:30pm

## 2023-12-27 NOTE — Discharge Instructions (Signed)
Home Instructions Following Incision and Drainage of Perirectal Abscess ° °Wound care °- A dressing has been applied to control any bleeding or drainage immediately after your procedure.  You may remove this dressing at your first bowel movement or tomorrow morning, whichever comes first.  There may be packing inside your wound as well that should be removed with the dressing.  You do not need to repack the area.  After the dressing is removed, clean the area gently with a mild soap and warm water and place a piece of 100% cotton over the area.  Change to cotton every 1-3 hours while awake to keep the area clean and dry.   °- Beginning tomorrow, sit in a tub of warm water for 15-20 minutes at least twice a day and after bowel movements (Sitz baths).  This will help with healing, pain and discomfort. °- A small amount of bleeding is to be expected.  If you notice an increase in the bleeding, place a large piece of cotton (about the size of a golf ball) next to the anal opening and sit on a hard surface for 15 minutes.  If the bleeding persists or if you are concerned, please call the office.  °Do not sit on rubber rings.  Instead, sit on a soft pillow.   °- you have a drain in place to help the area heal. This will be removed at your follow up appointment ° °Diet °-Eat a regular diet.  Avoid foods that may constipate you or give you diarrhea.  Drink 6-8 glasses of water a day and avoid seeds, nuts and popcorn until the area heals. ° °Medication °-Take pain medication as directed.  Do not drive or operate machinery if you are taking a prescription pain medication.   °- We recommend Extra Strength Tylenol for mild to moderate pain.  This can be taken as instructed on the bottle.   °- If you are given a prescription for antibiotics, take as instructed by your doctor until the entire course is completed ° °Bowel Habits °Avoid laxatives unless instructed by your doctor. °Take a fiber supplement twice a day (Metamucil,  FiberCon, Benefiber) °Avoid excessive straining to have a bowel movement °Do not go for more than 3 days without a bowel movement.  Take a regular Fleet enema if you are constipated.  Call the office if unable to do this or no results.   ° °Activity °Resume activities as tolerated beginning tomorrow.  Avoid strenuous activities or sports for one week.   ° °Call the office if you have any questions.  Call IMMEDIATELY if you should develop persistent heavy rectal bleeding, increase in pain, difficulty urinating or fever greater than 100 F.   ° °

## 2023-12-27 NOTE — Progress Notes (Signed)
 Patient's ordered medications delivered to the bedside prior to discharge.

## 2024-01-01 ENCOUNTER — Other Ambulatory Visit: Payer: Self-pay | Admitting: Internal Medicine

## 2024-01-01 LAB — AEROBIC/ANAEROBIC CULTURE W GRAM STAIN (SURGICAL/DEEP WOUND)

## 2024-01-02 ENCOUNTER — Other Ambulatory Visit: Payer: Self-pay | Admitting: Internal Medicine

## 2024-01-02 ENCOUNTER — Other Ambulatory Visit: Payer: Self-pay

## 2024-01-02 NOTE — Telephone Encounter (Signed)
 Copied from CRM [id number]. Topic: Clinical - Medication Refill >> Jan 02, 2024  1:26 PM Luane Rumps D wrote: Most Recent Primary Care Visit:  Provider: Roslyn Coombe  Department: [facility]  Visit Type: PHYSICAL  Date: 06/10/2023  Medication: albuterol  (PROVENTIL ) (2.5 MG/3ML) 0.083% nebulizer solution  Has the patient contacted their pharmacy? Yes (Agent: If no, request that the patient contact the pharmacy for the refill. If patient does not wish to contact the pharmacy document the reason why and proceed with request.) (Agent: If yes, when and what did the pharmacy advise?)  Is this the correct pharmacy for this prescription? Yes If no, delete pharmacy and type the correct one.  This is the patient's preferred pharmacy:   [facility] [address], [place] - [address] [place] [address] Phone: [phone] Fax: [phone]    Has the prescription been filled recently? Yes  Is the patient out of the medication? Yes  Has the patient been seen for an appointment in the last year OR does the patient have an upcoming appointment? Yes  Can we respond through MyChart? Yes  Agent: Please be advised that Rx refills may take up to 3 business days. We ask that you follow-up with your pharmacy.

## 2024-01-02 NOTE — Telephone Encounter (Signed)
 Disregard, saw medication had already been reordered

## 2024-02-07 ENCOUNTER — Ambulatory Visit
Admission: EM | Admit: 2024-02-07 | Discharge: 2024-02-07 | Disposition: A | Discharge status: Home / Self Care | Attending: Physician Assistant | Admitting: Physician Assistant

## 2024-02-07 DIAGNOSIS — R1010 Upper abdominal pain, unspecified: Secondary | ICD-10-CM | POA: Diagnosis not present

## 2024-02-07 MED ORDER — SUCRALFATE 1 G PO TABS: 1.0000 g | ORAL_TABLET | Freq: Three times a day (TID) | ORAL | 0 refills | Status: AC

## 2024-02-07 MED ORDER — ALUM & MAG HYDROXIDE-SIMETH 200-200-20 MG/5ML PO SUSP
30.0000 mL | Freq: Once | ORAL | Status: AC
  Administered 2024-02-07: 30 mL via ORAL

## 2024-02-07 NOTE — ED Triage Notes (Addendum)
 Pt present with mid upper abd pain x this morning. Pt states he felt constipated and nauseas when he got to work around 0730.  States he was "partying a lot" over the weekend.

## 2024-02-07 NOTE — Discharge Instructions (Addendum)
 Eat a bland diet and avoid spicy/acidic/fatty foods.  Avoid NSAIDs including aspirin, ibuprofen /Advil , naproxen /Aleve .  I also recommend avoiding alcohol.  Take Carafate before each meal before bed for the next week to help coat your stomach.  I will contact you if any of your blood work is abnormal.  If you have any worsening symptoms including severe pain, fever, nausea/vomiting, blood in your stool, blood in your vomit you need to go to the ER.
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
## 2024-02-08 ENCOUNTER — Ambulatory Visit: Payer: Self-pay | Admitting: Physician Assistant

## 2024-02-08 LAB — CBC WITH DIFFERENTIAL/PLATELET
Basophils Absolute: 0.1 10*3/uL (ref 0.0–0.2)
Basos: 1 %
EOS (ABSOLUTE): 0.1 10*3/uL (ref 0.0–0.4)
Eos: 1 %
Hematocrit: 58.2 % — ABNORMAL HIGH (ref 37.5–51.0)
Hemoglobin: 19 g/dL — ABNORMAL HIGH (ref 13.0–17.7)
Immature Grans (Abs): 0 10*3/uL (ref 0.0–0.1)
Immature Granulocytes: 0 %
Lymphocytes Absolute: 2.2 10*3/uL (ref 0.7–3.1)
Lymphs: 40 %
MCH: 30.7 pg (ref 26.6–33.0)
MCHC: 32.6 g/dL (ref 31.5–35.7)
MCV: 94 fL (ref 79–97)
Monocytes Absolute: 0.4 10*3/uL (ref 0.1–0.9)
Monocytes: 7 %
Neutrophils Absolute: 2.8 10*3/uL (ref 1.4–7.0)
Neutrophils: 51 %
Platelets: 335 10*3/uL (ref 150–450)
RBC: 6.18 x10E6/uL — ABNORMAL HIGH (ref 4.14–5.80)
RDW: 14.2 % (ref 11.6–15.4)
WBC: 5.5 10*3/uL (ref 3.4–10.8)

## 2024-02-08 LAB — COMPREHENSIVE METABOLIC PANEL WITH GFR
ALT: 36 IU/L (ref 0–44)
AST: 56 IU/L — ABNORMAL HIGH (ref 0–40)
Albumin: 5 g/dL (ref 4.1–5.1)
Alkaline Phosphatase: 56 IU/L (ref 44–121)
BUN/Creatinine Ratio: 15 (ref 9–20)
BUN: 18 mg/dL (ref 6–24)
Bilirubin Total: 0.5 mg/dL (ref 0.0–1.2)
CO2: 19 mmol/L — ABNORMAL LOW (ref 20–29)
Calcium: 9.7 mg/dL (ref 8.7–10.2)
Chloride: 98 mmol/L (ref 96–106)
Creatinine, Ser: 1.19 mg/dL (ref 0.76–1.27)
Globulin, Total: 2.9 g/dL (ref 1.5–4.5)
Glucose: 87 mg/dL (ref 70–99)
Potassium: 4.7 mmol/L (ref 3.5–5.2)
Sodium: 140 mmol/L (ref 134–144)
Total Protein: 7.9 g/dL (ref 6.0–8.5)
eGFR: 76 mL/min/{1.73_m2} (ref >59)

## 2024-02-08 LAB — LIPASE: Lipase: 20 U/L (ref 13–78)

## 2024-04-03 ENCOUNTER — Other Ambulatory Visit: Payer: Self-pay | Admitting: Internal Medicine

## 2024-04-18 ENCOUNTER — Ambulatory Visit
Admission: EM | Admit: 2024-04-18 | Discharge: 2024-04-18 | Disposition: A | Discharge status: Home / Self Care | Payer: Self-pay | Attending: Physician Assistant | Admitting: Physician Assistant

## 2024-04-18 ENCOUNTER — Encounter: Payer: Self-pay | Admitting: Emergency Medicine

## 2024-04-18 DIAGNOSIS — M25512 Pain in left shoulder: Secondary | ICD-10-CM

## 2024-04-18 MED ORDER — CYCLOBENZAPRINE HCL 10 MG PO TABS: 10.0000 mg | ORAL_TABLET | Freq: Two times a day (BID) | ORAL | 0 refills | Status: AC | PRN

## 2024-04-18 MED ORDER — PREDNISONE 20 MG PO TABS: 40.0000 mg | ORAL_TABLET | Freq: Every day | ORAL | 0 refills | Status: AC

## 2024-04-18 NOTE — ED Triage Notes (Signed)
 Pt st's he fell down approx 12 steps 4 days ago  Pt c/o continued pain to left shoulder also tingling in left arm and hand
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
## 2024-04-20 ENCOUNTER — Emergency Department (HOSPITAL_COMMUNITY): Payer: Self-pay

## 2024-04-20 ENCOUNTER — Emergency Department (HOSPITAL_COMMUNITY)
Admission: EM | Admit: 2024-04-20 | Discharge: 2024-04-20 | Disposition: A | Discharge status: Home / Self Care | Payer: Self-pay

## 2024-04-20 ENCOUNTER — Other Ambulatory Visit: Payer: Self-pay

## 2024-04-20 DIAGNOSIS — S43102A Unspecified dislocation of left acromioclavicular joint, initial encounter: Secondary | ICD-10-CM | POA: Insufficient documentation

## 2024-04-20 DIAGNOSIS — W108XXA Fall (on) (from) other stairs and steps, initial encounter: Secondary | ICD-10-CM | POA: Insufficient documentation

## 2024-04-20 DIAGNOSIS — Y92009 Unspecified place in unspecified non-institutional (private) residence as the place of occurrence of the external cause: Secondary | ICD-10-CM | POA: Insufficient documentation

## 2024-04-20 DIAGNOSIS — W19XXXA Unspecified fall, initial encounter: Secondary | ICD-10-CM

## 2024-04-20 DIAGNOSIS — M5412 Radiculopathy, cervical region: Secondary | ICD-10-CM | POA: Insufficient documentation

## 2024-04-20 MED ORDER — KETOROLAC TROMETHAMINE 15 MG/ML IJ SOLN
15.0000 mg | Freq: Once | INTRAMUSCULAR | Status: AC
  Administered 2024-04-20: 15 mg via INTRAMUSCULAR
  Filled 2024-04-20: qty 1

## 2024-04-20 MED ORDER — ACETAMINOPHEN 325 MG PO TABS
650.0000 mg | ORAL_TABLET | Freq: Once | ORAL | Status: AC
  Administered 2024-04-20: 650 mg via ORAL
  Filled 2024-04-20: qty 2

## 2024-04-20 MED ORDER — LIDOCAINE 5 % EX PTCH
1.0000 | MEDICATED_PATCH | CUTANEOUS | 0 refills | Status: DC
Start: 2024-04-20 — End: 2024-06-03

## 2024-04-20 MED ORDER — METHOCARBAMOL 500 MG PO TABS: 1000.0000 mg | ORAL_TABLET | Freq: Four times a day (QID) | ORAL | 0 refills | Status: AC | PRN

## 2024-04-20 MED ORDER — METHOCARBAMOL 500 MG PO TABS
1000.0000 mg | ORAL_TABLET | Freq: Once | ORAL | Status: AC
  Administered 2024-04-20: 1000 mg via ORAL
  Filled 2024-04-20: qty 2

## 2024-04-20 NOTE — ED Triage Notes (Signed)
 Pt slipped and fell backwards down some stairs at home, has c/o left neck, shoulder, and back pain. Pt also reports having tingling in his left arm. Pt was seen at UC, no xrays were done, but was prescribed prednisone  and a muscle relaxer that aren't helping.

## 2024-04-20 NOTE — Discharge Instructions (Addendum)
 Alternate Tylenol  and Motrin  as needed for pain.  You can take your Robaxin  up to 4 times a day as needed.  Use your lidocaine  patches as needed.  Call your primary care doctor and make a follow-up appointment to further discuss your symptoms and outpatient imaging.

## 2024-04-20 NOTE — ED Provider Notes (Signed)
 [place] [facility] DE[facility]MENT AT [facility] Provider Note   CSN: [id number] Arrival date & time: 04/20/24  1114     Patient presents with: Arm Injury   Jay Gentry is a 48 y.o. male.   48 year old male presents for evaluation of left neck and shoulder pain after a fall a few days ago.  States he was seen at urgent care but had no imaging done then he has been taking his steroids and Robaxin  as prescribed but no improvement in his symptoms.  States he is numb in the left arm and having trouble performing light duty at work.  Denies any other symptoms or concerns.   Arm Injury Associated symptoms: neck pain   Associated symptoms: no back pain and no fever        Prior to Admission medications   Medication Sig Start Date End Date Taking? Authorizing Provider  lidocaine  (LIDODERM ) 5 % Place 1 patch onto the skin daily. Remove & Discard patch within 12 hours or as directed by MD 04/20/24  Yes Bryer Cozzolino L, DO  methocarbamol  (ROBAXIN ) 500 MG tablet Take 2 tablets (1,000 mg total) by mouth every 6 (six) hours as needed for muscle spasms. 04/20/24  Yes Reegan Bouffard L, DO  acetaminophen  (TYLENOL ) 500 MG tablet Take 2 tablets (1,000 mg total) by mouth every 8 (eight) hours as needed for mild pain or headache. 11/14/18   Vicci Sor R, PA-C  albuterol  (PROVENTIL ) (2.5 MG/3ML) 0.083% nebulizer solution TAKE 3 MLS BY NEBULIZATION EVERY 4 HOURS AS NEEDED WHEEZING OR SHORTNESS OF BREATH 04/04/24   Norleen Lynwood ORN, MD  albuterol  (VENTOLIN  HFA) 108 (90 Base) MCG/ACT inhaler INHALE 1 TO 2 PUFFS INTO THE LUNGS EVERY 6 HOURS AS NEEDED 04/04/24   Norleen Lynwood ORN, MD  amLODipine  (NORVASC ) 5 MG tablet TAKE 1 TABLET(5 MG) BY MOUTH DAILY 06/10/23   Norleen Lynwood ORN, MD  cyclobenzaprine  (FLEXERIL ) 10 MG tablet Take 1 tablet (10 mg total) by mouth 2 (two) times daily as needed for muscle spasms. 04/18/24   Billy Asberry FALCON, PA-C  docusate sodium  (COLACE) 100 MG capsule Take 1 capsule (100 mg total)  by mouth 2 (two) times daily as needed for mild constipation or moderate constipation. 12/27/23   Rosalba Glendale DEL, PA-C  polyethylene glycol (MIRALAX  / GLYCOLAX ) 17 g packet Take 17 g by mouth 2 (two) times daily as needed for mild constipation or moderate constipation. 12/27/23   Rosalba Glendale DEL, PA-C  predniSONE  (DELTASONE ) 20 MG tablet Take 2 tablets (40 mg total) by mouth daily with breakfast for 5 days. 04/18/24 04/23/24  Billy Asberry FALCON, PA-C  sucralfate  (CARAFATE ) 1 g tablet Take 1 tablet (1 g total) by mouth 4 (four) times daily -  with meals and at bedtime. 02/07/24   Raspet, Erin K, PA-C    Allergies: Penicillins    Review of Systems  Constitutional:  Negative for chills and fever.  HENT:  Negative for ear pain and sore throat.   Eyes:  Negative for pain and visual disturbance.  Respiratory:  Negative for cough and shortness of breath.   Cardiovascular:  Negative for chest pain and palpitations.  Gastrointestinal:  Negative for abdominal pain and vomiting.  Genitourinary:  Negative for dysuria and hematuria.  Musculoskeletal:  Positive for neck pain. Negative for arthralgias and back pain.  Skin:  Negative for color change and rash.  Neurological:  Negative for seizures and syncope.  All other systems reviewed and are negative.   Updated Vital  Signs BP (!) 139/90 (BP Location: Left Arm)   Pulse (!) 104   Temp 98 F (36.7 C) (Oral)   Resp 17   SpO2 99%   Physical Exam Vitals and nursing note reviewed.  Constitutional:      General: He is not in acute distress.    Appearance: Normal appearance. He is well-developed. He is not ill-appearing.  HENT:     Head: Normocephalic and atraumatic.  Eyes:     Conjunctiva/sclera: Conjunctivae normal.  Neck:     Comments: Some mild tenderness to palpation over the left lateral neck, no midline C-spine tenderness Cardiovascular:     Rate and Rhythm: Normal rate and regular rhythm.     Heart sounds: No murmur heard. Pulmonary:      Effort: Pulmonary effort is normal. No respiratory distress.     Breath sounds: Normal breath sounds.  Abdominal:     Palpations: Abdomen is soft.     Tenderness: There is no abdominal tenderness.  Musculoskeletal:        General: No swelling. Normal range of motion.     Cervical back: Normal range of motion and neck supple.     Comments: Left posterior shoulder tenderness to palpation  Skin:    General: Skin is warm and dry.     Capillary Refill: Capillary refill takes less than 2 seconds.  Neurological:     Mental Status: He is alert.  Psychiatric:        Mood and Affect: Mood normal.     (all labs ordered are listed, but only abnormal results are displayed) Labs Reviewed - No data to display  EKG: None  Radiology: CT Cervical Spine Wo Contrast Result Date: 04/20/2024 CLINICAL DATA:  Fall with neck pain and left arm numbness. EXAM: CT CERVICAL SPINE WITHOUT CONTRAST TECHNIQUE: Multidetector CT imaging of the cervical spine was performed without intravenous contrast. Multiplanar CT image reconstructions were also generated. RADIATION DOSE REDUCTION: This exam was performed according to the departmental dose-optimization program which includes automated exposure control, adjustment of the mA and/or kV according to patient size and/or use of iterative reconstruction technique. COMPARISON:  None Available. FINDINGS: Alignment: Mild reversal of the normal cervical lordosis. No evidence of posttraumatic subluxation. Skull base and vertebrae: Vertebral body heights are normal. There is mild spondylosis of the cervical spine with minimal uncovertebral joint spurring. No acute fracture or subluxation. Minimal right-sided neural foraminal narrowing at the C4-5 level and C5-6 levels. Soft tissues and spinal canal: Prevertebral soft tissues are normal. Disc levels: Minimal disc space narrowing at the C5-6 level. Possible central disc protrusion at the C3-4 level. Upper chest: No acute findings.  Other: None. IMPRESSION: 1. No acute fracture or subluxation of the cervical spine. 2. Mild spondylosis of the cervical spine with minimal disc disease at the C5-6 level. Minimal right-sided neural foraminal narrowing at the C4-5 and C5-6 levels. 3. Possible central disc protrusion at the C3-4 level. Consider MRI for further evaluation on an elective basis. Electronically Signed   By: Toribio Agreste M.D.   On: 04/20/2024 13:46   DG Shoulder Left Result Date: 04/20/2024 EXAM: 1 VIEW XRAY OF THE LEFT SHOULDER 04/20/2024 12:27:00 PM COMPARISON: None available. CLINICAL HISTORY: Fall, shoulder pain. Per chart: Pt slipped and fell backwards down some stairs at home, has c/o left neck, shoulder, and back pain. Pt also reports having tingling in his left arm. Pt was seen at [facility], no xrays were done, but was prescribed prednisone  and a muscle relaxer;  that aren't helping. FINDINGS: BONES AND JOINTS: Visualized portion of the left hemithorax is within normal limits. the distal clavicle projects significantly cephalad to the acromion process. SOFT TISSUES: No abnormal calcifications. IMPRESSION: 1. Elevation of the distal clavicle, most consistent with acromioclavicular joint separation. Electronically signed by: Rockey Kilts MD 04/20/2024 12:47 PM EDT RP Workstation: [id number]     Procedures   Medications Ordered in the ED  ketorolac  (TORADOL ) 15 MG/ML injection 15 mg (15 mg Intramuscular Given 04/20/24 1206)  acetaminophen  (TYLENOL ) tablet 650 mg (650 mg Oral Given 04/20/24 1406)  methocarbamol  (ROBAXIN ) tablet 1,000 mg (1,000 mg Oral Given by Other 04/20/24 1406)                                    Medical Decision Making Patient found to have left AC joint separation and some degenerative disc disease/disc protrusion on his CT neck.  He was given Toradol  and Robaxin  in the ER.  Will give prescription for Robaxin  and lidocaine  patches.  Advised Tylenol  and Motrin  as needed for pain.  Advise close follow-up with  primary care and otherwise return to the ER for new or worsening symptoms.  He feels comfortable to plan to be discharged home.  Problems Addressed: Cervical radiculopathy: acute illness or injury Fall, initial encounter: acute illness or injury Separation of left acromioclavicular joint, initial encounter: acute illness or injury  Amount and/or Complexity of Data Reviewed External Data Reviewed: notes.    Details: Urgent care records reviewed and patient was recently started on steroids that have not helped his pain Radiology: ordered and independent interpretation performed. Decision-making details documented in ED Course.    Details: Ordered and reviewed by me Left shoulder x-ray: Shows evidence of AC joint separation but no other acute abnormality CT of the C-spine: Shows evidence of some disc disease at C3-C4 and some neural foraminal stenosis on the right  Risk OTC drugs. Prescription drug management.     Final diagnoses:  Separation of left acromioclavicular joint, initial encounter  Fall, initial encounter  Cervical radiculopathy    ED Discharge Orders          Ordered    lidocaine  (LIDODERM ) 5 %  Every 24 hours        04/20/24 1351    methocarbamol  (ROBAXIN ) 500 MG tablet  Every 6 hours PRN        04/20/24 1351               Mada Sadik, [place] L, DO 04/21/24 [phone]

## 2024-04-20 NOTE — ED Notes (Signed)
 Gave pt sandwich/drink

## 2024-04-23 NOTE — ED Notes (Signed)
 04/23/2024 1230 opened chart at pts request to provide RTW note.

## 2024-04-30 ENCOUNTER — Inpatient Hospital Stay: Payer: Self-pay | Admitting: Internal Medicine

## 2024-06-01 ENCOUNTER — Emergency Department (HOSPITAL_COMMUNITY): Payer: Self-pay

## 2024-06-01 ENCOUNTER — Other Ambulatory Visit: Payer: Self-pay

## 2024-06-01 ENCOUNTER — Emergency Department (HOSPITAL_COMMUNITY)
Admission: EM | Admit: 2024-06-01 | Discharge: 2024-06-01 | Disposition: A | Discharge status: Home / Self Care | Payer: Self-pay | Attending: Emergency Medicine | Admitting: Emergency Medicine

## 2024-06-01 DIAGNOSIS — W108XXS Fall (on) (from) other stairs and steps, sequela: Secondary | ICD-10-CM | POA: Insufficient documentation

## 2024-06-01 DIAGNOSIS — S43102S Unspecified dislocation of left acromioclavicular joint, sequela: Secondary | ICD-10-CM | POA: Insufficient documentation

## 2024-06-01 DIAGNOSIS — I10 Essential (primary) hypertension: Secondary | ICD-10-CM | POA: Insufficient documentation

## 2024-06-01 DIAGNOSIS — Z79899 Other long term (current) drug therapy: Secondary | ICD-10-CM | POA: Insufficient documentation

## 2024-06-01 DIAGNOSIS — J45909 Unspecified asthma, uncomplicated: Secondary | ICD-10-CM | POA: Insufficient documentation

## 2024-06-01 MED ORDER — LIDOCAINE 5 % EX PTCH
1.0000 | MEDICATED_PATCH | CUTANEOUS | Status: DC
  Administered 2024-06-01: 1 via TRANSDERMAL
  Filled 2024-06-01: qty 1

## 2024-06-01 MED ORDER — METHOCARBAMOL 500 MG PO TABS: 500.0000 mg | ORAL_TABLET | Freq: Two times a day (BID) | ORAL | 0 refills | Status: AC

## 2024-06-01 MED ORDER — METHOCARBAMOL 500 MG PO TABS
500.0000 mg | ORAL_TABLET | Freq: Once | ORAL | Status: AC
  Administered 2024-06-01: 500 mg via ORAL
  Filled 2024-06-01: qty 1

## 2024-06-01 MED ORDER — LIDOCAINE 5 % EX OINT: 1.0000 | TOPICAL_OINTMENT | CUTANEOUS | 0 refills | Status: DC | PRN

## 2024-06-01 MED ORDER — KETOROLAC TROMETHAMINE 15 MG/ML IJ SOLN
15.0000 mg | Freq: Once | INTRAMUSCULAR | Status: AC
  Administered 2024-06-01: 15 mg via INTRAMUSCULAR
  Filled 2024-06-01: qty 1
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
## 2024-06-01 NOTE — Discharge Instructions (Addendum)
 As we discussed, your workup in the ER today was reassuring for acute findings.  I suspect that your pain is related to the separation of the collarbone to your shoulder that you were diagnosed with when you were seen a month ago.  Ultimately, this takes time to heal.  I have given you a shoulder sling you can wear for support.  Please only wear this intermittently as long term use can cause stiffness.  I have given you a referral to orthopedics with a number to call to schedule appointment for follow-up.  Please call at your earliest convenience.  I have also given you a prescription for Robaxin  and lidocaine  patches that you can use as prescribed as needed for management of your symptoms.  Do not drive or operate heavy machinery with the Robaxin  as it can make you sleepy.  You can also take Tylenol /ibuprofen  for your pain.  Please use acetaminophen  (Tylenol ) or ibuprofen  (Advil , Motrin ) for pain.  You may use 800 mg ibuprofen  every 6 hours or 1000 mg of acetaminophen  every 6 hours.  You may choose to alternate between the two, this would be most effective. Do not exceed 4000 mg of acetaminophen  within 24 hours.  Do not exceed 3200 mg ibuprofen  within 24 hours.  Return if development of any new or worsening symptoms.
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
## 2024-06-01 NOTE — Progress Notes (Signed)
 Orthopedic Tech Progress Note Patient Details:  Jay Gentry Jul 20, 1976 [id number]  Ortho Devices Type of Ortho Device: Shoulder immobilizer Ortho Device/Splint Location: Left arm Ortho Device/Splint Interventions: Application   Post Interventions Patient Tolerated: Well  Massie BRAVO Samika Vetsch 06/01/2024, 6:54 PM

## 2024-06-01 NOTE — ED Notes (Signed)
 Dark green, Light green, Lavender tops labeled and sent to lab.

## 2024-06-03 ENCOUNTER — Emergency Department (HOSPITAL_COMMUNITY)
Admission: EM | Admit: 2024-06-03 | Discharge: 2024-06-03 | Disposition: A | Discharge status: Home / Self Care | Payer: Self-pay | Attending: Emergency Medicine | Admitting: Emergency Medicine

## 2024-06-03 ENCOUNTER — Encounter (HOSPITAL_COMMUNITY): Payer: Self-pay

## 2024-06-03 ENCOUNTER — Other Ambulatory Visit: Payer: Self-pay

## 2024-06-03 DIAGNOSIS — W19XXXA Unspecified fall, initial encounter: Secondary | ICD-10-CM | POA: Insufficient documentation

## 2024-06-03 DIAGNOSIS — S43102S Unspecified dislocation of left acromioclavicular joint, sequela: Secondary | ICD-10-CM | POA: Insufficient documentation

## 2024-06-03 MED ORDER — LIDOCAINE 5 % EX PTCH
1.0000 | MEDICATED_PATCH | CUTANEOUS | Status: DC
  Administered 2024-06-03: 1 via TRANSDERMAL
  Filled 2024-06-03: qty 1

## 2024-06-03 MED ORDER — OXYCODONE HCL 5 MG PO TABS
10.0000 mg | ORAL_TABLET | Freq: Once | ORAL | Status: AC
  Administered 2024-06-03: 10 mg via ORAL
  Filled 2024-06-03: qty 2

## 2024-06-03 MED ORDER — LIDOCAINE 5 % EX PTCH: 1.0000 | MEDICATED_PATCH | CUTANEOUS | 0 refills | Status: AC

## 2024-06-03 NOTE — Discharge Instructions (Addendum)
 As we discussed, I would like for you to take 600 mg ibuprofen  every 6 hours as needed for pain.  You can stack on Tylenol  650 mg at the 3-hour mark.  Please take lidocaine  patches as prescribed.  I have provided you a work note.  We have also provided your diagnosis and some rehab exercises.  Please call [facility] to schedule an appointment tomorrow.  Please return to the emergency department for any worsening symptoms.

## 2024-06-03 NOTE — ED Triage Notes (Signed)
 Pt came in for left shoulder pain and tingling. Pt had a tear in his shoulder from a fall last month and the pain has not gotten better.

## 2024-06-03 NOTE — ED Provider Notes (Signed)
 [place] EMERGENCY DEPARTMENT AT [facility] Provider Note   CSN: [id number] Arrival date & time: 06/03/24  1805     Patient presents with: Shoulder Pain   Jay Gentry is a 48 y.o. male patient who presents to the emergency department today for further evaluation left shoulder pain.  Pain has been persistent since an injury back in August.  He was seen for that injury.  Was noted to have [facility] joint separation.  Patient states that it was healing well.  Certain positional movements does cause pain.  He came back as it was still having a grinding sensation and pain with certain movements.  Patient did 250 push-ups a couple of days prior to worsening pain.  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  He denies any new injury.  He states the Robaxin  is not helping.  He does state that Tylenol  and aspirin does provide some temporary relief.    Shoulder Pain      Prior to Admission medications   Medication Sig Start Date End Date Taking? Authorizing Provider  lidocaine  (LIDODERM ) 5 % Place 1 patch onto the skin daily. Remove & Discard patch within 12 hours or as directed by MD 06/03/24  Yes Theotis, Sirenity Shew M, PA-C  acetaminophen  (TYLENOL ) 500 MG tablet Take 2 tablets (1,000 mg total) by mouth every 8 (eight) hours as needed for mild pain or headache. 11/14/18   Vicci Burnard SAUNDERS, PA-C  albuterol  (PROVENTIL ) (2.5 MG/3ML) 0.083% nebulizer solution TAKE 3 MLS BY NEBULIZATION EVERY 4 HOURS AS NEEDED WHEEZING OR SHORTNESS OF BREATH 04/04/24   Norleen Lynwood ORN, MD  albuterol  (VENTOLIN  HFA) 108 [phone] Base) MCG/ACT inhaler INHALE 1 TO 2 PUFFS INTO THE LUNGS EVERY 6 HOURS AS NEEDED 04/04/24   Norleen Lynwood ORN, MD  amLODipine  (NORVASC ) 5 MG tablet TAKE 1 TABLET(5 MG) BY MOUTH DAILY 06/10/23   Norleen Lynwood ORN, MD  cyclobenzaprine  (FLEXERIL ) 10 MG tablet Take 1 tablet (10 mg total) by mouth 2 (two) times daily as needed for muscle spasms. 04/18/24   Billy Asberry FALCON, PA-C  docusate sodium  (COLACE) 100 MG capsule Take 1 capsule (100 mg total) by mouth 2 (two) times daily as needed for mild constipation or moderate constipation. 12/27/23   Rosalba Glendale DEL, PA-C  methocarbamol  (ROBAXIN ) 500 MG tablet Take 2 tablets (1,000 mg total) by mouth every 6 (six) hours as needed for muscle spasms. 04/20/24   Kammerer, Megan L, DO  methocarbamol  (ROBAXIN ) 500 MG tablet Take 1 tablet (500 mg total) by mouth 2 (two) times daily. 06/01/24   Smoot, Lauraine LABOR, PA-C  polyethylene glycol (MIRALAX  / GLYCOLAX ) 17 g packet Take 17 g by mouth 2 (two) times daily as needed for mild constipation or moderate constipation. 12/27/23   Rosalba Glendale DEL, PA-C  sucralfate  (CARAFATE ) 1 g tablet Take 1 tablet (1 g total) by mouth 4 (four) times daily -  with meals and at bedtime. 02/07/24   Raspet, Erin K, PA-C    Allergies: Penicillins    Review of Systems  All other systems reviewed and are negative.   Updated Vital Signs BP (!) 151/97 (BP Location: Right Arm)   Pulse 92   Temp 98.4 F (36.9 C) (Oral)   Resp 16   SpO2 100%   Physical Exam Vitals and nursing note reviewed.  Constitutional:      Appearance: Normal appearance.  HENT:     Head: Normocephalic  and atraumatic.  Eyes:     General:        Right eye: No discharge.        Left eye: No discharge.     Conjunctiva/sclera: Conjunctivae normal.  Pulmonary:     Effort: Pulmonary effort is normal.  Musculoskeletal:     Comments: Patient does have palpable spasm in the left pectoral muscle, left trapezius muscle, left paracervical musculature.  Certainly is an [facility] joint separation of the clavicle on the left does sit a little bit higher at the joint.  In comparison to the right.  Neurovascularly intact distally.  Skin:    General: Skin is warm and dry.     Findings: No rash.  Neurological:     General: No focal deficit present.     Mental Status: He is alert.  Psychiatric:        Mood and Affect: Mood normal.         Behavior: Behavior normal.     (all labs ordered are listed, but only abnormal results are displayed) Labs Reviewed - No data to display  EKG: None  Radiology: No results found.  Procedures   Medications Ordered in the ED  oxyCODONE  (Oxy IR/ROXICODONE ) immediate release tablet 10 mg (has no administration in time range)  lidocaine  (LIDODERM ) 5 % 1 patch (has no administration in time range)     Medical Decision Making Jay Gentry is a 48 y.o. male patient who presents to the emergency department today for further evaluation of left shoulder pain.  I think this is a sequelae of the [facility] joint separation and is still not fully healed.  He has not followed up with his primary care doctor nor orthopedist.  I will give him a referral to orthopedist.  Do not feel that imaging is warranted as he said 2 x-rays within the last month and a half which shows AC joint separation.  This is likely the cause.  I will give him lidocaine  patches and we talked about appropriate NSAID control and he is taking the NSAID sparingly when he has pain.  We also talked about rehab exercises for [facility] joint separation.  I have also provided those on his discharge paperwork.  Another sling was provided for him today.  Have given him some time off work and restricted him to light duty.  Strict return precautions were discussed.  He is safe for discharge at this time.   Risk Prescription drug management.     Final diagnoses:  Separation of left acromioclavicular joint, sequela    ED Discharge Orders          Ordered    lidocaine  (LIDODERM ) 5 %  Every 24 hours        06/03/24 1907               Theotis Cameron HERO, PA-C 06/03/24 1913    Armenta Canning, MD 06/03/24 2124

## 2024-07-28 ENCOUNTER — Other Ambulatory Visit: Payer: Self-pay

## 2024-07-28 ENCOUNTER — Emergency Department (HOSPITAL_COMMUNITY)
Admission: EM | Admit: 2024-07-28 | Discharge: 2024-07-29 | Disposition: A | Discharge status: Home / Self Care | Payer: Self-pay | Attending: Emergency Medicine | Admitting: Emergency Medicine

## 2024-07-28 DIAGNOSIS — Y908 Blood alcohol level of 240 mg/100 ml or more: Secondary | ICD-10-CM | POA: Diagnosis not present

## 2024-07-28 DIAGNOSIS — F17210 Nicotine dependence, cigarettes, uncomplicated: Secondary | ICD-10-CM | POA: Diagnosis not present

## 2024-07-28 DIAGNOSIS — F10120 Alcohol abuse with intoxication, uncomplicated: Secondary | ICD-10-CM | POA: Diagnosis present

## 2024-07-28 DIAGNOSIS — F10180 Alcohol abuse with alcohol-induced anxiety disorder: Secondary | ICD-10-CM

## 2024-07-28 DIAGNOSIS — F32A Depression, unspecified: Secondary | ICD-10-CM | POA: Insufficient documentation

## 2024-07-28 DIAGNOSIS — F10920 Alcohol use, unspecified with intoxication, uncomplicated: Secondary | ICD-10-CM

## 2024-07-28 DIAGNOSIS — Z79899 Other long term (current) drug therapy: Secondary | ICD-10-CM | POA: Diagnosis not present

## 2024-07-28 DIAGNOSIS — I10 Essential (primary) hypertension: Secondary | ICD-10-CM | POA: Diagnosis not present

## 2024-07-28 DIAGNOSIS — F10129 Alcohol abuse with intoxication, unspecified: Secondary | ICD-10-CM

## 2024-07-28 DIAGNOSIS — J45909 Unspecified asthma, uncomplicated: Secondary | ICD-10-CM | POA: Diagnosis not present

## 2024-07-28 DIAGNOSIS — F1014 Alcohol abuse with alcohol-induced mood disorder: Secondary | ICD-10-CM

## 2024-07-28 LAB — CBC
HCT: 52.2 % — ABNORMAL HIGH (ref 39.0–52.0)
Hemoglobin: 18 g/dL — ABNORMAL HIGH (ref 13.0–17.0)
MCH: 31.3 pg (ref 26.0–34.0)
MCHC: 34.5 g/dL (ref 30.0–36.0)
MCV: 90.8 fL (ref 80.0–100.0)
Platelets: 328 K/uL (ref 150–400)
RBC: 5.75 MIL/uL (ref 4.22–5.81)
RDW: 12.5 % (ref 11.5–15.5)
WBC: 5.9 K/uL (ref 4.0–10.5)
nRBC: 0 % (ref 0.0–0.2)

## 2024-07-28 LAB — COMPREHENSIVE METABOLIC PANEL WITH GFR
ALT: 22 U/L (ref 0–44)
AST: 21 U/L (ref 15–41)
Albumin: 4.9 g/dL (ref 3.5–5.0)
Alkaline Phosphatase: 55 U/L (ref 38–126)
Anion gap: 18 — ABNORMAL HIGH (ref 5–15)
BUN: 15 mg/dL (ref 6–20)
CO2: 20 mmol/L — ABNORMAL LOW (ref 22–32)
Calcium: 9.8 mg/dL (ref 8.9–10.3)
Chloride: 104 mmol/L (ref 98–111)
Creatinine, Ser: 1.02 mg/dL (ref 0.61–1.24)
GFR, Estimated: >60 mL/min (ref >60)
Glucose, Bld: 83 mg/dL (ref 70–99)
Potassium: 4 mmol/L (ref 3.5–5.1)
Sodium: 142 mmol/L (ref 135–145)
Total Bilirubin: 0.3 mg/dL (ref 0.0–1.2)
Total Protein: 7.8 g/dL (ref 6.5–8.1)

## 2024-07-28 LAB — URINE DRUG SCREEN
Amphetamines: NEGATIVE
Barbiturates: NEGATIVE
Benzodiazepines: NEGATIVE
Cocaine: NEGATIVE
Fentanyl: NEGATIVE
Methadone Scn, Ur: NEGATIVE
Opiates: NEGATIVE
Tetrahydrocannabinol: NEGATIVE

## 2024-07-28 LAB — ETHANOL: Alcohol, Ethyl (B): 367 mg/dL (ref <15)
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
## 2024-07-28 NOTE — ED Notes (Signed)
 Pt has been changed into burgundy scrubs. All pt belongings (phone, shoes, clothing) have been taken and placed in belonging bag and placed in [place] B cabinet at 9-12 nurses station.

## 2024-07-28 NOTE — BH Assessment (Signed)
 Patient was deferred to [facility] for a telepsych assessment. The assigned care coordinator will provide updates regarding the scheduling of the assessment. [facility] coordinator can be reached at [phone] for further information on the timing of the telepsych evaluation.
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
## 2024-07-28 NOTE — ED Triage Notes (Signed)
 Pt arrived via [facility] for possible ETOH was found in Parking lot walking around after having 5 shots of whiskey, not a normal drinker headache and left jaw pain has a bad tooth  112/76 98 16 99% ra 88 cbg

## 2024-07-29 NOTE — ED Provider Notes (Signed)
 Care assumed at 2300.  Patient with history of alcohol use disorder here accompanied by police with alcohol intoxication.  Care assumed pending TTS eval.  TTS has evaluated the patient with recommendation for outpatient treatment.  Patient is awake, alert and oriented.  Feel he is stable to discharge in police custody.  Discussed outpatient resources for alcohol use disorder with return precautions.   Griselda Norris, MD 07/29/24 [phone]
